# Patient Record
Sex: Female | Born: 1975 | Race: White | Hispanic: No | Marital: Married | Smoking: Never smoker
Health system: Southern US, Community
[De-identification: ages and names within clinical notes are randomized; demographics above are authoritative.]

## PROBLEM LIST (undated history)

## (undated) DIAGNOSIS — C50919 Malignant neoplasm of unspecified site of unspecified female breast: Secondary | ICD-10-CM

## (undated) DIAGNOSIS — Z8041 Family history of malignant neoplasm of ovary: Secondary | ICD-10-CM

## (undated) DIAGNOSIS — Z801 Family history of malignant neoplasm of trachea, bronchus and lung: Secondary | ICD-10-CM

## (undated) DIAGNOSIS — Z800 Family history of malignant neoplasm of digestive organs: Secondary | ICD-10-CM

## (undated) DIAGNOSIS — Z8052 Family history of malignant neoplasm of bladder: Secondary | ICD-10-CM

## (undated) HISTORY — PX: THYROIDECTOMY: SHX17

## (undated) HISTORY — PX: ADENOIDECTOMY: SHX5191

## (undated) HISTORY — DX: Family history of malignant neoplasm of bladder: Z80.52

## (undated) HISTORY — PX: MASTECTOMY: SHX3

## (undated) HISTORY — DX: Family history of malignant neoplasm of trachea, bronchus and lung: Z80.1

## (undated) HISTORY — DX: Family history of malignant neoplasm of digestive organs: Z80.0

## (undated) HISTORY — DX: Family history of malignant neoplasm of ovary: Z80.41

---
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
## 2000-07-26 HISTORY — PX: BREAST EXCISIONAL BIOPSY: SUR124

## 2018-07-14 ENCOUNTER — Other Ambulatory Visit: Payer: Self-pay | Admitting: Family Medicine

## 2018-07-14 DIAGNOSIS — E049 Nontoxic goiter, unspecified: Secondary | ICD-10-CM

## 2018-07-18 ENCOUNTER — Ambulatory Visit
Admission: RE | Admit: 2018-07-18 | Discharge: 2018-07-18 | Disposition: A | Discharge status: Home / Self Care | Payer: 59 | Source: Ambulatory Visit | Attending: Family Medicine | Admitting: Family Medicine

## 2018-07-18 DIAGNOSIS — E049 Nontoxic goiter, unspecified: Secondary | ICD-10-CM

## 2018-07-21 ENCOUNTER — Other Ambulatory Visit: Payer: Self-pay

## 2018-07-24 ENCOUNTER — Other Ambulatory Visit: Payer: Self-pay | Admitting: Family Medicine

## 2018-07-24 DIAGNOSIS — E049 Nontoxic goiter, unspecified: Secondary | ICD-10-CM

## 2018-08-09 ENCOUNTER — Ambulatory Visit
Admission: RE | Admit: 2018-08-09 | Discharge: 2018-08-09 | Disposition: A | Discharge status: Home / Self Care | Payer: 59 | Source: Ambulatory Visit | Attending: Family Medicine | Admitting: Family Medicine

## 2018-08-09 ENCOUNTER — Other Ambulatory Visit (HOSPITAL_COMMUNITY)
Admission: RE | Admit: 2018-08-09 | Discharge: 2018-08-09 | Disposition: A | Discharge status: Home / Self Care | Payer: 59 | Source: Ambulatory Visit | Attending: Radiology | Admitting: Radiology

## 2018-08-09 DIAGNOSIS — E041 Nontoxic single thyroid nodule: Secondary | ICD-10-CM | POA: Insufficient documentation

## 2018-08-09 DIAGNOSIS — E049 Nontoxic goiter, unspecified: Secondary | ICD-10-CM

## 2018-11-22 ENCOUNTER — Other Ambulatory Visit: Payer: Self-pay | Admitting: Family Medicine

## 2018-11-22 DIAGNOSIS — R234 Changes in skin texture: Secondary | ICD-10-CM

## 2018-12-01 ENCOUNTER — Other Ambulatory Visit: Payer: Self-pay | Admitting: Family Medicine

## 2018-12-01 ENCOUNTER — Ambulatory Visit
Admission: RE | Admit: 2018-12-01 | Discharge: 2018-12-01 | Disposition: A | Discharge status: Home / Self Care | Payer: 59 | Source: Ambulatory Visit | Attending: Family Medicine | Admitting: Family Medicine

## 2018-12-01 ENCOUNTER — Other Ambulatory Visit: Payer: Self-pay

## 2018-12-01 DIAGNOSIS — R234 Changes in skin texture: Secondary | ICD-10-CM

## 2018-12-01 DIAGNOSIS — N6320 Unspecified lump in the left breast, unspecified quadrant: Secondary | ICD-10-CM

## 2018-12-06 ENCOUNTER — Telehealth: Payer: Self-pay | Admitting: *Deleted

## 2018-12-06 NOTE — Telephone Encounter (Signed)
MR faxed to [place] RN/Dr. Harland German; [place] [phone]

## 2018-12-08 ENCOUNTER — Other Ambulatory Visit: Payer: Self-pay

## 2018-12-08 ENCOUNTER — Other Ambulatory Visit: Payer: Self-pay | Admitting: Family Medicine

## 2018-12-08 ENCOUNTER — Ambulatory Visit
Admission: RE | Admit: 2018-12-08 | Discharge: 2018-12-08 | Disposition: A | Discharge status: Home / Self Care | Payer: 59 | Source: Ambulatory Visit | Attending: Family Medicine | Admitting: Family Medicine

## 2018-12-08 DIAGNOSIS — N6320 Unspecified lump in the left breast, unspecified quadrant: Secondary | ICD-10-CM

## 2018-12-08 DIAGNOSIS — C50919 Malignant neoplasm of unspecified site of unspecified female breast: Secondary | ICD-10-CM

## 2018-12-08 HISTORY — DX: Malignant neoplasm of unspecified site of unspecified female breast: C50.919

## 2018-12-12 ENCOUNTER — Other Ambulatory Visit: Payer: Self-pay | Admitting: Family Medicine

## 2018-12-12 ENCOUNTER — Telehealth: Payer: Self-pay | Admitting: Oncology

## 2018-12-12 DIAGNOSIS — C50912 Malignant neoplasm of unspecified site of left female breast: Secondary | ICD-10-CM

## 2018-12-12 NOTE — Telephone Encounter (Signed)
Cld and lft the pt a vm to schedule an appt w/Dr. Jana Hakim

## 2018-12-13 ENCOUNTER — Other Ambulatory Visit: Payer: Self-pay

## 2018-12-13 ENCOUNTER — Encounter: Payer: Self-pay | Admitting: Oncology

## 2018-12-13 ENCOUNTER — Inpatient Hospital Stay: Payer: 59 | Attending: Oncology | Admitting: Oncology

## 2018-12-13 DIAGNOSIS — Z170 Estrogen receptor positive status [ER+]: Secondary | ICD-10-CM | POA: Diagnosis not present

## 2018-12-13 DIAGNOSIS — C50812 Malignant neoplasm of overlapping sites of left female breast: Secondary | ICD-10-CM | POA: Insufficient documentation

## 2018-12-13 NOTE — Progress Notes (Signed)
[place]  Telephone:([phone]) [phone] Fax:(336) [phone]     ID: Tonya Whitehead DOB: [birth year]  MR#: [id number]  DGU#:[id number]  Patient Care Team: Hayden Rasmussen, MD as PCP - General (Family Medicine) Breleigh Carpino, Virgie Dad, MD as Consulting Physician (Oncology) Mordecai Rasmussen, MD as Consulting Physician (Surgery) Natividad Brood, MD as Referring Physician (General Surgery) Chauncey Cruel, MD OTHER MD:   CHIEF COMPLAINT: estrogen receptor positive breast cancer  CURRENT TREATMENT: consider neoadjuvant therapy   HISTORY OF CURRENT ILLNESS: Tonya Whitehead notes a history of reconstructive left breast surgery in 2002 while living in [place], [place]. That was for symmetry. The left breast accordingly had some scar tissue and she thought that was what she was noting until mid March, when she felt the left breast was changing more and the nipple seemed to be sinking subtly.  She brought this to Dr Dahlia Bailiff attention and underwent bilateral diagnostic mammography with tomography and bilateral breast ultrasonography at [facility] on 12/01/2018 showing: breast density category C; findings concerning for extensive malignancy throughout the left breast predominately involving the upper- and lower-outer quadrants.  The mass was palpable within the upper outer left breast and there was left nipple retraction as well as indentation along the inferior margin of the left breast.  Targeted ultrasound showed three masses measuring 5.6 cm (2 o'clock position), 4.0 cm (close to the nipple and contiguous to the prior mass), and 3.3 cm (6 o'clock position.).  Also noted was a borderline left axillary lymph node adjacent to the left axillary artery (and so not easy to biopsy).  In the right breast was noted a 1.3 cm cyst.  On 12/08/2018 [facility] proceeded to biopsy of 2 of the left breast areas in question. The pathology from this procedure ([id number]) showed: invasive mammary carcinoma,  grade 2, in two cores (at 2 o'clock and 6 o'clock) with identical histomorphology; e-cadherin is negative, consistent with a lobular phenotype. Prognostic indicators significant for: estrogen receptor, 70% positive and progesterone receptor, 70% positive, both with strong staining intensity. Proliferation marker Ki67 at 3%. HER2 negative by immunohistochemistry (1+).  Note also the patient had a thyroid ultrasound 07/18/2018 showing bilateral solitary nodules, which were biopsied 08/09/2018.  Both showed follicular nodules, Bethesda category 2 (benign).  The patient's subsequent history is as detailed below.   INTERVAL HISTORY: [name] was evaluated in the breast cancer clinic on 12/13/2018. Her husband Doug participated via [place].  She developed a mild ecchymosis and some discomfort from the recent biopsy.  She is scheduled for bilateral breast MRI on 12/20/2018.   REVIEW OF SYSTEMS: Paulena reports a history of joint pain. She has since cut gluten out of her diet, which has improved her joint pain.  There are no areas of persistent localized pain.  The patient denies unusual headaches, visual changes, nausea, vomiting, stiff neck, dizziness, or gait imbalance. There has been no cough, phlegm production, or pleurisy, no chest pain or pressure, and no change in bowel or bladder habits. The patient denies fever, rash, bleeding, unexplained fatigue or unexplained weight loss. A detailed review of systems was otherwise entirely negative.   PAST MEDICAL HISTORY: Past Medical History:  Diagnosis Date   Breast cancer ([facility]) 12/08/2018   invasive lobular    PAST SURGICAL HISTORY: Past Surgical History:  Procedure Laterality Date   ADENOIDECTOMY     as a child   BREAST EXCISIONAL BIOPSY Left 2002   reconstructive surgery on nipple to match right breast  Adenoidectomy as a  child.  FAMILY HISTORY: Family History  Problem Relation Age of Onset   Uterine cancer Mother 36   Lung cancer  Mother 50       smoker   Colon cancer Maternal Aunt        108s   Bladder Cancer Paternal Grandfather        death was unrelated   Cancer Paternal Aunt    As of May 2020 patient's father is alive at age 70. Patient's mother is also living at age 53. She has ovarian cancer, diagnosed in 2019, and lung cancer (stage IV), diagnosed in 2016. She tested negative for BRCA.  A maternal aunt had colon cancer in her 49s.  3 other siblings of the patient's mother have not had any cancer.  On the paternal side the paternal grandmother had bladder cancer.  A paternal great aunt died from cancer of the pancreas.     GYNECOLOGIC HISTORY:  Patient's last menstrual period was 11/14/2018. Menarche: 43 years old Age at first live birth: 43 years old GX P 3 LMP every month, 5 total days with 2 heavy Contraceptive no, husband with vasectomy. HRT n/a  Hysterectomy? no BSO? 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 Husband Tonya Whitehead is a physical therapist. He is biologically [place] and was adopted by a [place] family.  The patient lives at home with her husband and their 3 children: [name] is 96, [name] is 20, and  [name], 59.  The patient attends a [employer].    ADVANCED DIRECTIVES: Husband Tonya Whitehead is her HCPOA   HEALTH MAINTENANCE: Social History   Tobacco Use   Smoking status: Not on file  Substance Use Topics   Alcohol use: Not on file   Drug use: Never     Colonoscopy: never done  PAP: not on file  Bone density: never done   Allergies  Allergen Reactions   Sulfa Antibiotics     Unknown reaction " I was told I had a reaction when I was a baby "    No current outpatient medications on file.   No current facility-administered medications for this visit.     OBJECTIVE: Young white woman who appears well  Vitals:   12/13/18 1613  BP: 111/86  Pulse: 77  Resp: 18  Temp: 98.5 F (36.9 C)  SpO2: 100%     Body mass  index is 23.91 kg/m.   Wt Readings from Last 3 Encounters:  12/13/18 143 lb 11.2 oz (65.2 kg)      ECOG FS:0 - Asymptomatic  Ocular: Sclerae unicteric, pupils round and equal Lymphatic: No cervical or supraclavicular adenopathy Lungs no rales or rhonchi Heart regular rate and rhythm Abd soft, nontender, positive bowel sounds MSK no focal spinal tenderness, no joint edema Neuro: non-focal, well-oriented, appropriate affect Breasts: The right breast is unremarkable.  The left breast is status post recent biopsy. There is a moderate ecchymosis in the inferior aspect of the breast.  The nipple is slightly retracted.  The areola is smaller than the right areola, but recall this breast is status post prior surgery.  The left breast masses are easily palpable but are very hard to delineate.  Both axillae are benign.  Left breast status post recent biopsy photographed 12/13/2018      LAB RESULTS:  CMP  No results found for: NA, K, CL, CO2, GLUCOSE, BUN, CREATININE, CALCIUM, PROT, ALBUMIN, AST, ALT, ALKPHOS, BILITOT, GFRNONAA, GFRAA  No results  found for: TOTALPROTELP, ALBUMINELP, A1GS, A2GS, BETS, BETA2SER, GAMS, MSPIKE, SPEI  No results found for: KPAFRELGTCHN, LAMBDASER, KAPLAMBRATIO  No results found for: WBC, NEUTROABS, HGB, HCT, MCV, PLT  _0 @  No results found for: LABCA2  No components found for: [id number]  No results for input(s): INR in the last 168 hours.  No results found for: LABCA2  No results found for: BZJ696  No results found for: VEL381  No results found for: OFB510  No results found for: CA2729  No components found for: HGQUANT  No results found for: CEA1 / No results found for: CEA1   No results found for: AFPTUMOR  No results found for: CHROMOGRNA  No results found for: PSA1  No visits with results within 3 Day(s) from this visit.  Latest known visit with results is:  No results found for any previous visit.    (this displays  the last labs from the last 3 days)  No results found for: TOTALPROTELP, ALBUMINELP, A1GS, A2GS, BETS, BETA2SER, GAMS, MSPIKE, SPEI (this displays SPEP labs)  No results found for: KPAFRELGTCHN, LAMBDASER, KAPLAMBRATIO (kappa/lambda light chains)  No results found for: HGBA, HGBA2QUANT, HGBFQUANT, HGBSQUAN (Hemoglobinopathy evaluation)   No results found for: LDH  No results found for: IRON, TIBC, IRONPCTSAT (Iron and TIBC)  No results found for: FERRITIN  Urinalysis No results found for: COLORURINE, APPEARANCEUR, LABSPEC, PHURINE, GLUCOSEU, HGBUR, BILIRUBINUR, KETONESUR, PROTEINUR, UROBILINOGEN, NITRITE, LEUKOCYTESUR   STUDIES: US Breast Ltd Uni Left Inc Axilla  Result Date: 12/01/2018 CLINICAL DATA:  Patient presents for palpable abnormality and indentation within the left breast. EXAM: DIGITAL DIAGNOSTIC BILATERAL MAMMOGRAM WITH CAD AND TOMO ULTRASOUND LEFT BREAST COMPARISON:  None ACR Breast Density Category c: The breast tissue is heterogeneously dense, which may obscure small masses. FINDINGS: The left breast is shrunken in appearance. There is a large area of distortion with associated mass predominately involving the upper outer and lower outer left breast. This appears to extend centrally within the left breast to the level of the nipple. Within the superior central right breast there is an oval circumscribed mass. Mammographic images were processed with CAD. On physical exam, I palpate a firm mass within the upper-outer left breast. There is left nipple retraction. There is indentation along the inferior margin of the left breast. Targeted ultrasound is performed, showing a 1.3 x 0.8 x 1.3 cm cyst right breast 12 o'clock position 2 cm from the nipple. Within the left breast 2 o'clock position 1-2 cm from the nipple there is a 4.6 x 2.9 x 5.6 cm irregular mixed echogenicity mass. This mass appears to extend to the level of the nipple. Slightly closer to the nipple, extending and  contiguous with the previously described mass is an additional 4.0 x 3.5 x 4.0 cm irregular mixed echogenicity mass left breast 2 o'clock position 1 cm from the nipple. Within the left breast 6 o'clock position 3 cm from nipple there is a 3.0 x 1.7 x 3.3 cm irregular hypoechoic mass. Borderline left axillary lymph node measuring 3 mm. This lymph node is deep within the left axilla and appears adjacent to a left axillary artery. IMPRESSION: 1. Findings concerning for extensive malignancy throughout the left breast predominately involving the upper outer and lower outer quadrants. 2. Borderline left axillary lymph node measuring 3 mm. RECOMMENDATION: 1. Ultrasound-guided core needle biopsy large left breast mass 2 o'clock position 1-2 cm from nipple. 2. Ultrasound-guided core needle biopsy left breast mass 6 o'clock position. 3. The left axillary lymph node  is borderline in size and deep within the axilla, adjacent to an artery. This is likely not accessible to percutaneous biopsy. Recommend attention to the axilla on MRI. 4. After ultrasound-guided core needle biopsy, recommend bilateral breast MRI for further evaluation of extent of disease given dense breast tissue and shrunken left breast. I have discussed the findings and recommendations with the patient. Results were also provided in writing at the conclusion of the visit. If applicable, a reminder letter will be sent to the patient regarding the next appointment. BI-RADS CATEGORY  5: Highly suggestive of malignancy. Electronically Signed   By: Lovey Newcomer M.D.   On: 12/01/2018 17:03   US Breast Ltd Uni Right Inc Axilla  Result Date: 12/01/2018 CLINICAL DATA:  Patient presents for palpable abnormality and indentation within the left breast. EXAM: DIGITAL DIAGNOSTIC BILATERAL MAMMOGRAM WITH CAD AND TOMO ULTRASOUND LEFT BREAST COMPARISON:  None ACR Breast Density Category c: The breast tissue is heterogeneously dense, which may obscure small masses. FINDINGS:  The left breast is shrunken in appearance. There is a large area of distortion with associated mass predominately involving the upper outer and lower outer left breast. This appears to extend centrally within the left breast to the level of the nipple. Within the superior central right breast there is an oval circumscribed mass. Mammographic images were processed with CAD. On physical exam, I palpate a firm mass within the upper-outer left breast. There is left nipple retraction. There is indentation along the inferior margin of the left breast. Targeted ultrasound is performed, showing a 1.3 x 0.8 x 1.3 cm cyst right breast 12 o'clock position 2 cm from the nipple. Within the left breast 2 o'clock position 1-2 cm from the nipple there is a 4.6 x 2.9 x 5.6 cm irregular mixed echogenicity mass. This mass appears to extend to the level of the nipple. Slightly closer to the nipple, extending and contiguous with the previously described mass is an additional 4.0 x 3.5 x 4.0 cm irregular mixed echogenicity mass left breast 2 o'clock position 1 cm from the nipple. Within the left breast 6 o'clock position 3 cm from nipple there is a 3.0 x 1.7 x 3.3 cm irregular hypoechoic mass. Borderline left axillary lymph node measuring 3 mm. This lymph node is deep within the left axilla and appears adjacent to a left axillary artery. IMPRESSION: 1. Findings concerning for extensive malignancy throughout the left breast predominately involving the upper outer and lower outer quadrants. 2. Borderline left axillary lymph node measuring 3 mm. RECOMMENDATION: 1. Ultrasound-guided core needle biopsy large left breast mass 2 o'clock position 1-2 cm from nipple. 2. Ultrasound-guided core needle biopsy left breast mass 6 o'clock position. 3. The left axillary lymph node is borderline in size and deep within the axilla, adjacent to an artery. This is likely not accessible to percutaneous biopsy. Recommend attention to the axilla on MRI. 4.  After ultrasound-guided core needle biopsy, recommend bilateral breast MRI for further evaluation of extent of disease given dense breast tissue and shrunken left breast. I have discussed the findings and recommendations with the patient. Results were also provided in writing at the conclusion of the visit. If applicable, a reminder letter will be sent to the patient regarding the next appointment. BI-RADS CATEGORY  5: Highly suggestive of malignancy. Electronically Signed   By: Lovey Newcomer M.D.   On: 12/01/2018 17:03   Mm Diag Breast Tomo Bilateral  Result Date: 12/01/2018 CLINICAL DATA:  Patient presents for palpable abnormality and  indentation within the left breast. EXAM: DIGITAL DIAGNOSTIC BILATERAL MAMMOGRAM WITH CAD AND TOMO ULTRASOUND LEFT BREAST COMPARISON:  None ACR Breast Density Category c: The breast tissue is heterogeneously dense, which may obscure small masses. FINDINGS: The left breast is shrunken in appearance. There is a large area of distortion with associated mass predominately involving the upper outer and lower outer left breast. This appears to extend centrally within the left breast to the level of the nipple. Within the superior central right breast there is an oval circumscribed mass. Mammographic images were processed with CAD. On physical exam, I palpate a firm mass within the upper-outer left breast. There is left nipple retraction. There is indentation along the inferior margin of the left breast. Targeted ultrasound is performed, showing a 1.3 x 0.8 x 1.3 cm cyst right breast 12 o'clock position 2 cm from the nipple. Within the left breast 2 o'clock position 1-2 cm from the nipple there is a 4.6 x 2.9 x 5.6 cm irregular mixed echogenicity mass. This mass appears to extend to the level of the nipple. Slightly closer to the nipple, extending and contiguous with the previously described mass is an additional 4.0 x 3.5 x 4.0 cm irregular mixed echogenicity mass left breast 2 o'clock  position 1 cm from the nipple. Within the left breast 6 o'clock position 3 cm from nipple there is a 3.0 x 1.7 x 3.3 cm irregular hypoechoic mass. Borderline left axillary lymph node measuring 3 mm. This lymph node is deep within the left axilla and appears adjacent to a left axillary artery. IMPRESSION: 1. Findings concerning for extensive malignancy throughout the left breast predominately involving the upper outer and lower outer quadrants. 2. Borderline left axillary lymph node measuring 3 mm. RECOMMENDATION: 1. Ultrasound-guided core needle biopsy large left breast mass 2 o'clock position 1-2 cm from nipple. 2. Ultrasound-guided core needle biopsy left breast mass 6 o'clock position. 3. The left axillary lymph node is borderline in size and deep within the axilla, adjacent to an artery. This is likely not accessible to percutaneous biopsy. Recommend attention to the axilla on MRI. 4. After ultrasound-guided core needle biopsy, recommend bilateral breast MRI for further evaluation of extent of disease given dense breast tissue and shrunken left breast. I have discussed the findings and recommendations with the patient. Results were also provided in writing at the conclusion of the visit. If applicable, a reminder letter will be sent to the patient regarding the next appointment. BI-RADS CATEGORY  5: Highly suggestive of malignancy. Electronically Signed   By: Lovey Newcomer M.D.   On: 12/01/2018 17:03   Mm Clip Placement Left  Result Date: 12/08/2018 CLINICAL DATA:  Patient status post ultrasound-guided core needle biopsy 2 left breast. EXAM: DIAGNOSTIC LEFT MAMMOGRAM POST ULTRASOUND BIOPSY COMPARISON:  Previous exam(s). FINDINGS: Mammographic images were obtained following ultrasound guided biopsy of left breast masses. Site 1: Upper-outer left breast: Ribbon shaped marking clip: In appropriate position. Site 2: Inferior left breast: Coil shaped clip: In appropriate position. IMPRESSION: Appropriate position  biopsy marking clip status post ultrasound-guided biopsy 2 left breast masses. Final Assessment: Post Procedure Mammograms for Marker Placement Electronically Signed   By: Lovey Newcomer M.D.   On: 12/08/2018 10:13   [place] Lt Breast Bx W Loc Dev 1st Lesion Img Bx Spec US Guide  Addendum Date: 12/13/2018   ADDENDUM REPORT: 12/12/2018 17:37 ADDENDUM: Pathology revealed GRADE II INVASIVE MAMMARY CARCINOMA, INTERMEDIATE GRADE MAMMARY CARCINOMA IN SITU of the LEFT breast, 2 o'clock. This was  found to be concordant by Dr. Lovey Newcomer. Pathology revealed GRADE II INVASIVE MAMMARY CARCINOMA, INTERMEDIATE GRADE MAMMARY CARCINOMA IN SITU of the LEFT breast, 6 o'clock. This was found to be concordant by Dr. Lovey Newcomer. Pathology report and recommendations were faxed to Dr. Horald Pollen on Dec 11, 2018. Per provider request, pathology results were discussed with the patient by Dr. Horald Pollen of [facility] at her office. The patient was then contacted by Stacie Acres RN. She reported doing well after the biopsies with tenderness at the sites. Post biopsy instructions and care were reviewed and questions were answered. The patient was encouraged to call [facility] for any additional concerns. Bilateral breast MRI is recommended to further evaluate extent of disease given dense breast tissue and shrunken left breast. The LEFT axillary lymph node is borderline in size and deep within the axilla, adjacent to an artery. This is likely not accessible to percutaneous biopsy. Recommend attention to the axilla on MRI. Bilateral breast MRI is scheduled for Dec 23, 2018. Per patient requests, Oncologist consultation with Dr. Lurline Del of [place] is Dec 13, 2018 and surgical consultation with Dr. Natividad Brood of [place] is December 27, 2018. Pathology results reported by Stacie Acres, RN on 12/12/2018. Electronically Signed   By: Franki Cabot M.D.    On: 12/12/2018 17:37   Result Date: 12/13/2018 CLINICAL DATA:  Patient with indeterminate left breast masses. EXAM: ULTRASOUND GUIDED LEFT BREAST CORE NEEDLE BIOPSY COMPARISON:  Previous exam(s). FINDINGS: I met with the patient and we discussed the procedure of ultrasound-guided biopsy, including benefits and alternatives. We discussed the high likelihood of a successful procedure. We discussed the risks of the procedure, including infection, bleeding, tissue injury, clip migration, and inadequate sampling. Informed written consent was given. The usual time-out protocol was performed immediately prior to the procedure. Site 1: 2 o'clock left breast: Ribbon shaped marking clip. Lesion quadrant: Upper outer quadrant Using sterile technique and 1% Lidocaine as local anesthetic, under direct ultrasound visualization, a 14 gauge spring-loaded device was used to perform biopsy of left breast mass 2 o'clock position using a lateral approach. At the conclusion of the procedure a ribbon shaped tissue marker clip was deployed into the biopsy cavity. Follow up 2 view mammogram was performed and dictated separately. Site 2: 6 o'clock position left breast: Coil shaped marking clip. Lesion quadrant: Lower inner quadrant Using sterile technique and 1% Lidocaine as local anesthetic, under direct ultrasound visualization, a 14 gauge spring-loaded device was used to perform biopsy of left breast mass 6 o'clock position using a lateral approach. At the conclusion of the procedure a coil shaped tissue marker clip was deployed into the biopsy cavity. Follow up 2 view mammogram was performed and dictated separately. IMPRESSION: Ultrasound guided biopsy of left breast masses as above. No apparent complications. Electronically Signed: By: Lovey Newcomer M.D. On: 12/08/2018 10:15   [place] Lt Breast Bx W Loc Dev Ea Add Lesion Img Bx Spec US Guide  Addendum Date: 12/13/2018   ADDENDUM REPORT: 12/12/2018 17:37 ADDENDUM: Pathology revealed  GRADE II INVASIVE MAMMARY CARCINOMA, INTERMEDIATE GRADE MAMMARY CARCINOMA IN SITU of the LEFT breast, 2 o'clock. This was found to be concordant by Dr. Lovey Newcomer. Pathology revealed GRADE II INVASIVE MAMMARY CARCINOMA, INTERMEDIATE GRADE MAMMARY CARCINOMA IN SITU of the LEFT breast, 6 o'clock. This was found to be concordant by Dr. Lovey Newcomer. Pathology report and recommendations were faxed to Dr.  Horald Pollen on Dec 11, 2018. Per provider request, pathology results were discussed with the patient by Dr. Horald Pollen of [facility] at her office. The patient was then contacted by Stacie Acres RN. She reported doing well after the biopsies with tenderness at the sites. Post biopsy instructions and care were reviewed and questions were answered. The patient was encouraged to call [facility] for any additional concerns. Bilateral breast MRI is recommended to further evaluate extent of disease given dense breast tissue and shrunken left breast. The LEFT axillary lymph node is borderline in size and deep within the axilla, adjacent to an artery. This is likely not accessible to percutaneous biopsy. Recommend attention to the axilla on MRI. Bilateral breast MRI is scheduled for Dec 23, 2018. Per patient requests, Oncologist consultation with Dr. Lurline Del of [facility] [place] is Dec 13, 2018 and surgical consultation with Dr. Natividad Brood of [place] is December 27, 2018. Pathology results reported by Stacie Acres, RN on 12/12/2018. Electronically Signed   By: Franki Cabot M.D.   On: 12/12/2018 17:37   Result Date: 12/13/2018 CLINICAL DATA:  Patient with indeterminate left breast masses. EXAM: ULTRASOUND GUIDED LEFT BREAST CORE NEEDLE BIOPSY COMPARISON:  Previous exam(s). FINDINGS: I met with the patient and we discussed the procedure of ultrasound-guided biopsy, including benefits and alternatives. We discussed the high likelihood of a  successful procedure. We discussed the risks of the procedure, including infection, bleeding, tissue injury, clip migration, and inadequate sampling. Informed written consent was given. The usual time-out protocol was performed immediately prior to the procedure. Site 1: 2 o'clock left breast: Ribbon shaped marking clip. Lesion quadrant: Upper outer quadrant Using sterile technique and 1% Lidocaine as local anesthetic, under direct ultrasound visualization, a 14 gauge spring-loaded device was used to perform biopsy of left breast mass 2 o'clock position using a lateral approach. At the conclusion of the procedure a ribbon shaped tissue marker clip was deployed into the biopsy cavity. Follow up 2 view mammogram was performed and dictated separately. Site 2: 6 o'clock position left breast: Coil shaped marking clip. Lesion quadrant: Lower inner quadrant Using sterile technique and 1% Lidocaine as local anesthetic, under direct ultrasound visualization, a 14 gauge spring-loaded device was used to perform biopsy of left breast mass 6 o'clock position using a lateral approach. At the conclusion of the procedure a coil shaped tissue marker clip was deployed into the biopsy cavity. Follow up 2 view mammogram was performed and dictated separately. IMPRESSION: Ultrasound guided biopsy of left breast masses as above. No apparent complications. Electronically Signed: By: Lovey Newcomer M.D. On: 12/08/2018 10:15    ELIGIBLE FOR AVAILABLE RESEARCH PROTOCOL: no  ASSESSMENT: 43 y.o. [place] woman status post left breast biopsy x2 on 12/08/2018, for multicentric invasive lobular breast cancer, grade 2, estrogen and progesterone receptor positive, HER-2 nonamplified, with an MIB-1 of 3%  (1) consider neoadjuvant chemotherapy  (2) definitive surgery to follow  (3) adjuvant radiation  (4) antiestrogens  (5) genetics testing    PLAN: I spent approximately 60 minutes face to face with Shanterria with more than 50% of  that time spent in counseling and coordination of care. Specifically we reviewed the biology of the patient's diagnosis and the specifics of her situation.  We first reviewed the fact that cancer is not one disease but more than 100 different diseases and that it is important to keep them separate-- otherwise when friends and  relatives discuss their own cancer experiences with Shresta confusion can result. Similarly we explained that if breast cancer spreads to the bone or liver, the patient would not have bone cancer or liver cancer, but breast cancer in the bone and breast cancer in the liver: one cancer in three places-- not 3 different cancers which otherwise would have to be treated in 3 different ways.  She understands she has a lobular breast cancer, and that these cancers can be very difficult to detect radiographically.  This is important as she considers whether she would like bilateral rather than single mastectomy.  We discussed the difference between local and systemic therapy. In terms of loco-regional treatment, lumpectomy plus radiation is equivalent to mastectomy as far as survival is concerned.  However, given that she has multicentric disease on the left, I do not think it will be possible to save that breast.  We also discussed the fact that whether she has systemic treatment first and then surgery or the reverse does not affect survival.  Particularly if the surgeon after reviewing the breast MRI to be done next week feels shrinking the tumor some would make the surgery margins more definitive, we can proceed with neoadjuvant treatment.  We then discussed the rationale for systemic therapy. There is some risk that this cancer may have already spread to other parts of her body. We will obtain a CT scan of the chest and a bone scan which I expect will be negative.  That does not mean that there is not microscopic spread, and in fact given her stage her risk of having microscopic spread  (which would manifest itself as recurrence in the absence of any systemic treatment) is on the high side.  Next we went over the options for systemic therapy which are anti-estrogens, anti-HER-2 immunotherapy, and chemotherapy. Mahogony does not meet criteria for anti-HER-2 immunotherapy. She is a good candidate for anti-estrogens.  The question of chemotherapy is more complicated. Chemotherapy is most effective in rapidly growing, aggressive tumors. It is much less effective in slow growing cancers.  We do have Oncotype and MammaPrint which we could consider sending.  My hesitation there is the fact that we are dealing with a lobular breast cancer and 90% of the tumor is used in the databases for those particular studies were ductal.  The database becomes even smaller when we realized we are dealing with someone who is under 11.  The number of women under 45 with lobular breast cancers in either of those databases is simply not high enough that we can confidently omit chemotherapy even if her tumor is read as "low risk".  In that regard the fact that her lobular breast cancer is not grade 1 is of concern  Accordingly I believe she will need chemotherapy as part of her systemic treatment.  We do know that lobular breast cancers do not respond as briskly as ductal as when treated neoadjuvantly but that does not mean they do not respond.  We would certainly obtain some tumor shrinkage with neoadjuvant chemotherapy if we go that route.  At this point then the plan is for her to have her staging studies and her MRI.  She already has an appointment with Dr. Genia Hotter.  We will also get her an appointment with genetics and of course the results of that test may further inform her decision whether to have bilateral mastectomies, which is the choice she is currently leaning towards.  Kambry has a good understanding of the  overall plan. She agrees with it. She knows the goal of treatment in her case is cure. She will  call with any problems that may develop before her next visit here.   Chauncey Cruel, MD   12/13/2018 5:26 PM Medical Oncology and Hematology [facility] [address] [place], [place] [address] Tel. [phone]    Fax. [phone]   This document serves as a record of services personally performed by Lurline Del, MD. It was created on his behalf by Wilburn Mylar, a trained medical scribe. The creation of this record is based on the scribe's personal observations and the provider's statements to them.   I, Lurline Del MD, have reviewed the above documentation for accuracy and completeness, and I agree with the above.

## 2018-12-14 ENCOUNTER — Telehealth: Payer: Self-pay | Admitting: Oncology

## 2018-12-14 ENCOUNTER — Telehealth: Payer: Self-pay | Admitting: *Deleted

## 2018-12-14 NOTE — Telephone Encounter (Signed)
Scheduled appt per 5/20 sch message -  Unable to reach patient . Left message with appt date and time for Genetics

## 2018-12-14 NOTE — Telephone Encounter (Signed)
Called pt to provide navigation resources and contact information. Left vm with contact information for questions or needs. 

## 2018-12-15 ENCOUNTER — Telehealth: Payer: Self-pay | Admitting: Licensed Clinical Social Worker

## 2018-12-15 NOTE — Telephone Encounter (Signed)
Called patient regarding upcoming Webex appointment, left patient a voicemail and sent an e-mail.

## 2018-12-19 ENCOUNTER — Telehealth: Payer: Self-pay | Admitting: *Deleted

## 2018-12-19 ENCOUNTER — Inpatient Hospital Stay: Payer: 59 | Admitting: Licensed Clinical Social Worker

## 2018-12-19 NOTE — Telephone Encounter (Signed)
Spoke to pt regarding tx plan. Provided navigation resources. Pt will have 2 surgical consults. Informed pt once she has made her decision on surgeon and pathway to inform us. Provided contact information for questions or concerns.

## 2018-12-20 ENCOUNTER — Telehealth: Payer: Self-pay | Admitting: *Deleted

## 2018-12-20 ENCOUNTER — Ambulatory Visit
Admission: RE | Admit: 2018-12-20 | Discharge: 2018-12-20 | Disposition: A | Discharge status: Home / Self Care | Payer: 59 | Source: Ambulatory Visit | Attending: Family Medicine | Admitting: Family Medicine

## 2018-12-20 ENCOUNTER — Other Ambulatory Visit: Payer: Self-pay

## 2018-12-20 DIAGNOSIS — C50912 Malignant neoplasm of unspecified site of left female breast: Secondary | ICD-10-CM

## 2018-12-20 MED ORDER — GADOBUTROL 1 MMOL/ML IV SOLN
7.0000 mL | Freq: Once | INTRAVENOUS | Status: AC | PRN
  Administered 2018-12-20: 7 mL via INTRAVENOUS

## 2018-12-20 NOTE — Telephone Encounter (Signed)
Called pt and discussed MRI results of axilla. Pt informed she is scheduled for surgical consult later this week at [facility] and next week at [facility].

## 2018-12-21 ENCOUNTER — Telehealth: Payer: Self-pay | Admitting: Oncology

## 2018-12-21 NOTE — Telephone Encounter (Signed)
Scheduled appt per sch msg. Called and left msg.  °

## 2018-12-22 ENCOUNTER — Other Ambulatory Visit: Payer: Self-pay | Admitting: Oncology

## 2018-12-22 ENCOUNTER — Encounter: Payer: Self-pay | Admitting: Licensed Clinical Social Worker

## 2018-12-23 ENCOUNTER — Other Ambulatory Visit: Payer: 59

## 2018-12-25 ENCOUNTER — Ambulatory Visit: Payer: 59 | Admitting: Oncology

## 2018-12-25 DIAGNOSIS — C50412 Malignant neoplasm of upper-outer quadrant of left female breast: Secondary | ICD-10-CM | POA: Insufficient documentation

## 2018-12-25 DIAGNOSIS — Z170 Estrogen receptor positive status [ER+]: Secondary | ICD-10-CM | POA: Insufficient documentation

## 2018-12-26 ENCOUNTER — Telehealth: Payer: Self-pay | Admitting: Oncology

## 2018-12-26 NOTE — Telephone Encounter (Signed)
Faxed records to [facility] release #[id number]

## 2018-12-27 ENCOUNTER — Telehealth: Payer: Self-pay | Admitting: Oncology

## 2018-12-27 NOTE — Telephone Encounter (Signed)
Called patient regarding upcoming Webex appointment, patient is notified and would like to reschedule genetic counseling meeting as well. E-mails have been sent.

## 2018-12-29 ENCOUNTER — Ambulatory Visit (HOSPITAL_COMMUNITY)
Admission: RE | Admit: 2018-12-29 | Discharge: 2018-12-29 | Disposition: A | Discharge status: Home / Self Care | Payer: 59 | Source: Ambulatory Visit | Attending: Oncology | Admitting: Oncology

## 2018-12-29 ENCOUNTER — Other Ambulatory Visit: Payer: Self-pay

## 2018-12-29 ENCOUNTER — Encounter (HOSPITAL_COMMUNITY): Payer: Self-pay

## 2018-12-29 ENCOUNTER — Encounter (HOSPITAL_COMMUNITY)
Admission: RE | Admit: 2018-12-29 | Discharge: 2018-12-29 | Disposition: A | Discharge status: Home / Self Care | Payer: 59 | Source: Ambulatory Visit | Attending: Oncology | Admitting: Oncology

## 2018-12-29 DIAGNOSIS — C50812 Malignant neoplasm of overlapping sites of left female breast: Secondary | ICD-10-CM | POA: Diagnosis not present

## 2018-12-29 DIAGNOSIS — Z170 Estrogen receptor positive status [ER+]: Secondary | ICD-10-CM | POA: Diagnosis present

## 2018-12-29 MED ORDER — TECHNETIUM TC 99M MEDRONATE IV KIT
20.0000 | PACK | Freq: Once | INTRAVENOUS | Status: AC | PRN
  Administered 2018-12-29: 20 via INTRAVENOUS

## 2018-12-29 MED ORDER — SODIUM CHLORIDE (PF) 0.9 % IJ SOLN
INTRAMUSCULAR | Status: AC
  Filled 2018-12-29: qty 50

## 2018-12-29 MED ORDER — IOHEXOL 300 MG/ML  SOLN
75.0000 mL | Freq: Once | INTRAMUSCULAR | Status: AC | PRN
  Administered 2018-12-29: 75 mL via INTRAVENOUS

## 2019-01-01 ENCOUNTER — Inpatient Hospital Stay (HOSPITAL_BASED_OUTPATIENT_CLINIC_OR_DEPARTMENT_OTHER): Payer: 59 | Admitting: Licensed Clinical Social Worker

## 2019-01-01 ENCOUNTER — Inpatient Hospital Stay: Payer: 59 | Attending: Oncology | Admitting: Oncology

## 2019-01-01 ENCOUNTER — Inpatient Hospital Stay: Payer: 59 | Admitting: Nutrition

## 2019-01-01 ENCOUNTER — Encounter: Payer: Self-pay | Admitting: Licensed Clinical Social Worker

## 2019-01-01 DIAGNOSIS — Z8041 Family history of malignant neoplasm of ovary: Secondary | ICD-10-CM

## 2019-01-01 DIAGNOSIS — Z801 Family history of malignant neoplasm of trachea, bronchus and lung: Secondary | ICD-10-CM

## 2019-01-01 DIAGNOSIS — Z800 Family history of malignant neoplasm of digestive organs: Secondary | ICD-10-CM | POA: Diagnosis not present

## 2019-01-01 DIAGNOSIS — Z8052 Family history of malignant neoplasm of bladder: Secondary | ICD-10-CM | POA: Insufficient documentation

## 2019-01-01 DIAGNOSIS — Z170 Estrogen receptor positive status [ER+]: Secondary | ICD-10-CM

## 2019-01-01 DIAGNOSIS — C50812 Malignant neoplasm of overlapping sites of left female breast: Secondary | ICD-10-CM

## 2019-01-01 DIAGNOSIS — Z1379 Encounter for other screening for genetic and chromosomal anomalies: Secondary | ICD-10-CM

## 2019-01-01 NOTE — Progress Notes (Signed)
REFERRING PROVIDER: Chauncey Cruel, MD [address] [place], [place] [address]  PRIMARY PROVIDER:  Hayden Rasmussen, MD  PRIMARY REASON FOR VISIT:  1. Family history of ovarian cancer   2. Family history of colon cancer   3. Family history of lung cancer   4. Family history of bladder cancer    I connected with Tonya Whitehead on 01/01/2019 at 1:00 PM EDT by Webex and verified that I am speaking with the correct person using two identifiers.    Patient location: home Provider location: clinic   HISTORY OF PRESENT ILLNESS:   Ms. Grindle, a 43 y.o. female, was seen for a [place] cancer genetics consultation at the request of Dr. Jana Hakim due to a personal and family history of cancer.  Ms. Vasseur presents to clinic today to discuss the possibility of a hereditary predisposition to cancer, genetic testing, and to further clarify her future cancer risks, as well as potential cancer risks for family members.   In 2020, at the age of 62, Ms. Busick was diagnosed with invasive mammary carcinoma consistent with lobular phenotype of the left breast, ER+ PR+ Her2-. The treatment plan includes mastectomy on 6/23. Patient notes the results of genetic testing may change her surgical decision. Treatment also includes chemotherapy, radiation, and antiestrogens.  RISK FACTORS:  Menarche was at age 65  First live birth at age 54.  OCP use for approximately 0 years.  Ovaries intact: yes.  Hysterectomy: no.  Menopausal status: premenopausal.  HRT use: 0 years. Colonoscopy: no; not examined. Mammogram within the last year: yes.  Any excessive radiation exposure in the past: no  Past Medical History:  Diagnosis Date  . Breast cancer ([place]) 12/08/2018   invasive lobular  . Family history of bladder cancer   . Family history of colon cancer   . Family history of lung cancer   . Family history of ovarian cancer     Past Surgical History:  Procedure Laterality Date  .  ADENOIDECTOMY     as a child  . BREAST EXCISIONAL BIOPSY Left 2002   reconstructive surgery on nipple to match right breast    Social History   Socioeconomic History  . Marital status: Married    Spouse name: [name]  . Number of children: 3  . Years of education: Not on file  . Highest education level: Not on file  Occupational History  . Not on file  Social Needs  . Financial resource strain: Not on file  . Food insecurity:    Worry: Not on file    Inability: Not on file  . Transportation needs:    Medical: Not on file    Non-medical: Not on file  Tobacco Use  . Smoking status: Not on file  Substance and Sexual Activity  . Alcohol use: Not on file  . Drug use: Never  . Sexual activity: Yes    Birth control/protection: Other-see comments    Comment: husband had vasectomy  Lifestyle  . Physical activity:    Days per week: Not on file    Minutes per session: Not on file  . Stress: Not on file  Relationships  . Social connections:    Talks on phone: Not on file    Gets together: Not on file    Attends religious service: Not on file    Active member of club or organization: Not on file    Attends meetings of clubs or organizations: Not on file  Relationship status: Not on file  Other Topics Concern  . Not on file  Social History Narrative  . Not on file     FAMILY HISTORY:  We obtained a detailed, 4-generation family history.  Significant diagnoses are listed below: Family History  Problem Relation Age of Onset  . Lung cancer Mother 31       smoker  . Ovarian cancer Mother 62  . Colon cancer Maternal Aunt        55s  . Bladder Cancer Paternal Grandfather        death was unrelated  . Cancer Paternal Aunt        through [place], blood cancer   Ms. Thornsberry has 2 daughters and 1 son, no cancers. She has two maternal half brothers, one maternal half sister, no cancers.  Ms. Weimer mother is living at 41, was diagnosed with lung cancer at 54, history of  smoking. She was also diagnosed with ovarian cancer at 40. She had genetic testing through [facility], which was negative and the patient emailed me a copy of this report. The results included 2 VUS's, one in the APC gene the other in the POLE gene. The patient has 2 maternal uncles, 2 maternal aunts. One of her aunts had colon cancer in her 70s and died in her 53s. No cancers in cousins. Her maternal grandmother died at 66, maternal grandfather died at 34.  Ms. Zehren's father is living at 83. The patient has 1 paternal aunt, 1 paternal uncle, no cancers. No cancers in paternal cousins. Her paternal grandfather died at 19, he had bladder cancer diagnosed at 24 and history of smoking. Paternal grandmother is living at 24. This grandmother has a twin who had a blood cancer in her 69s, died in her 57s.  Ms. Hidrogo is aware of previous family history of genetic testing for hereditary cancer risks. There is no reported Ashkenazi Jewish ancestry. There is no known consanguinity.  GENETIC COUNSELING ASSESSMENT: Ms. Jaffer is a 43 y.o. female with a personal and family history which is somewhat suggestive of a hereditary cancer syndrome and predisposition to cancer. We, therefore, discussed and recommended the following at today's visit.   DISCUSSION: We discussed that 5 - 10% of breast cancer is hereditary, with most cases associated with the BRCA1/BRCA2.  There are other genes that can be associated with hereditary breast cancer syndromes.  These include PALB2, ATM, CHEK2.    We reviewed the characteristics, features and inheritance patterns of hereditary cancer syndromes. We also discussed genetic testing, including the appropriate family members to test, the process of testing, insurance coverage and turn-around-time for results. We discussed the implications of a negative, positive and/or variant of uncertain significant result. In order to get genetic test results in a timely manner so that Ms.  Pattison can use these genetic test results for surgical decisions, we recommended Ms. Manigo pursue genetic testing for the [facility] Breast Cancer STAT Panel. Once complete, we recommend Ms. Riede pursue reflex genetic testing to the Common Hereditary Cancers gene panel.   The STAT Breast cancer panel offered by [facility] includes sequencing and rearrangement analysis for the following 9 genes:  ATM, BRCA1, BRCA2, CDH1, CHEK2, PALB2, PTEN, STK11 and TP53.    The Common Hereditary Cancers Panel offered by [facility] includes sequencing and/or deletion duplication testing of the following 48 genes: APC, ATM, AXIN2, BARD1, BMPR1A, BRCA1, BRCA2, BRIP1, CDH1, CDKN2A (p14ARF), CDKN2A (p16INK4a), CKD4, CHEK2, CTNNA1, DICER1, EPCAM (Deletion/duplication testing only), GREM1 (promoter region  deletion/duplication testing only), KIT, MEN1, MLH1, MSH2, MSH3, MSH6, MUTYH, NBN, NF1, NHTL1, PALB2, PDGFRA, PMS2, POLD1, POLE, PTEN, RAD50, RAD51C, RAD51D, RNF43, SDHB, SDHC, SDHD, SMAD4, SMARCA4. STK11, TP53, TSC1, TSC2, and VHL.  The following genes were evaluated for sequence changes only: SDHA and HOXB13 c.251G>A variant only.  Based on Ms. Hail's personal and family history of cancer, she meets medical criteria for genetic testing. Despite that she meets criteria, she may still have an out of pocket cost.   PLAN: After considering the risks, benefits, and limitations, Ms. Stoneberg provided informed consent to pursue genetic testing and the blood sample was sent to [facility] for analysis of the STAT Breast Cancer Panel + Common Hereditary Cancers Panel. Initial results should be available within approximately 1 weeks' time, at which point they will be disclosed by telephone to Ms. Seda, as will any additional recommendations warranted by these results. Ms. Shaheed will receive a summary of her genetic counseling visit and a copy of her results once available. This information will also be available in  Epic.   Based on Ms. Orzechowski's family history, we recommended her maternal relatives have genetic counseling and testing. Ms. Mehl will let us know if we can be of any assistance in coordinating genetic counseling and/or testing for this family member.   Lastly, we encouraged Ms. Cargile to remain in contact with cancer genetics annually so that we can continuously update the family history and inform her of any changes in cancer genetics and testing that may be of benefit for this family.   Ms. [name] questions were answered to her satisfaction today. Our contact information was provided should additional questions or concerns arise. Thank you for the referral and allowing [place] to share in the care of your patient.   Faith Rogue, MS Genetic Counselor [email or web address] Phone: [phone]  The patient was seen for a total of 40 minutes in virtual genetic counseling.  Drs. Magrinat, Lindi Adie and/or Burr Medico were available for discussion regarding this case. A [employer] genetic counseling intern, Harrell Lark, was present for this case.

## 2019-01-01 NOTE — Progress Notes (Signed)
[place]  [phone] [phone]     ID: Tonya Whitehead DOB: 05-17-1976  MR#: [id number]  AUQ#:[id number]  Patient Care Team: Hayden Rasmussen, MD as PCP - General (Family Medicine) Maliq Pilley, Virgie Dad, MD as Consulting Physician (Oncology) Sheliah Hatch, MD as Consulting Physician (Surgical Oncology) Stann Ore, MD as Consulting Physician (Plastic Surgery) Stann Ore, MD (Plastic Surgery) Faith Rogue T as Counselor Chauncey Cruel, MD OTHER MD:  I connected with Tonya Whitehead on 01/01/19 at  3:00 PM EDT by video enabled telemedicine visit and verified that I am speaking with the correct person using two identifiers.   I discussed the limitations, risks, security and privacy concerns of performing an evaluation and management service by telemedicine and the availability of in-person appointments. I also discussed with the patient that there may be a patient responsible charge related to this service. The patient expressed understanding and agreed to proceed.   Other persons participating in the visit and their role in the encounter: husband Tonya Whitehead; Wilburn Mylar, scribe   Patients location: home  Providers location: [place]: estrogen receptor positive breast cancer  CURRENT TREATMENT: Definitive surgery pending   INTERVAL HISTORY: Tonya Whitehead is seen today for follow up of her estrogen receptor positive breast cancer.  Since her last visit, she underwent bilateral breast MRI at [facility] on 12/20/2018 showing: breast density category C; extensive left breast carcinoma extending into all 4 quadrants; no evidence of left axillary lymphadenopathy; no evidence of right breast malignancy.  She also underwent staging scans on 12/29/2018. Chest CT showed: no evidence of metastatic disease; multinodular goiter with dominant 4.1 cm left thyroid lobe nodule. Bone scan  was also negative for metastatic disease.  She is scheduled to undergo left mastectomy and SLN sampling with immediate reconstruction on 01/16/2019 under Drs. Glade Stanford and Leslie. At this point we are not requesting a port.  REVIEW OF SYSTEMS: Tonya Whitehead reports she is working from home. Husband Tonya Whitehead has been doing all the grocery shopping. The patient denies unusual headaches, visual changes, nausea, vomiting, stiff neck, dizziness, or gait imbalance. There has been no cough, phlegm production, or pleurisy, no chest pain or pressure, and no change in bowel or bladder habits. The patient denies fever, rash, bleeding, unexplained fatigue or unexplained weight loss. A detailed review of systems was otherwise entirely negative.   HISTORY OF CURRENT ILLNESS: Tonya Whitehead notes a history of reconstructive left breast surgery in 2002 while living in [place], [place]. That was for symmetry. The left breast accordingly had some scar tissue and she thought that was what she was noting until mid March, when she felt the left breast was changing more and the nipple seemed to be sinking subtly.  She brought this to Dr Dahlia Bailiff attention and underwent bilateral diagnostic mammography with tomography and bilateral breast ultrasonography at [facility] on 12/01/2018 showing: breast density category C; findings concerning for extensive malignancy throughout the left breast predominately involving the upper- and lower-outer quadrants.  The mass was palpable within the upper outer left breast and there was left nipple retraction as well as indentation along the inferior margin of the left breast.  Targeted ultrasound showed three masses measuring 5.6 cm (2 o'clock position), 4.0 cm (close to the nipple and contiguous to the prior mass), and 3.3 cm (6 o'clock position.).  Also noted was a borderline left axillary lymph node adjacent to the left axillary artery (and  so not easy to biopsy).  In the right breast was noted a  1.3 cm cyst.  On 12/08/2018 Tonya Whitehead proceeded to biopsy of 2 of the left breast areas in question. The pathology from this procedure ([id number]) showed: invasive mammary carcinoma, grade 2, in two cores (at 2 o'clock and 6 o'clock) with identical histomorphology; e-cadherin is negative, consistent with a lobular phenotype. Prognostic indicators significant for: estrogen receptor, 70% positive and progesterone receptor, 70% positive, both with strong staining intensity. Proliferation marker Ki67 at 3%. HER2 negative by immunohistochemistry (1+).  Note also the patient had a thyroid ultrasound 07/18/2018 showing bilateral solitary nodules, which were biopsied 08/09/2018.  Both showed follicular nodules, Bethesda category 2 (benign).  The patient's subsequent history is as detailed below.   PAST MEDICAL HISTORY: Past Medical History:  Diagnosis Date   Breast cancer ([facility] [place]) 12/08/2018   invasive lobular   Family history of bladder cancer    Family history of colon cancer    Family history of lung cancer    Family history of ovarian cancer     PAST SURGICAL HISTORY: Past Surgical History:  Procedure Laterality Date   ADENOIDECTOMY     as a child   BREAST EXCISIONAL BIOPSY Left 2002   reconstructive surgery on nipple to match right breast  Adenoidectomy as a child.   FAMILY HISTORY: Family History  Problem Relation Age of Onset   Lung cancer Mother 33       smoker   Ovarian cancer Mother 53   Colon cancer Maternal Aunt        68s   Bladder Cancer Paternal Grandfather        death was unrelated   Cancer Paternal Aunt        through [facility], blood cancer   As of May 2020 patient's father is alive at age 67. Patient's mother is also living at age 3. She has ovarian cancer, diagnosed in 2019, and lung cancer (stage IV), diagnosed in 2016. She tested negative for BRCA.  A maternal aunt had colon cancer in her 31s.  3 other siblings of the patient's mother have not had any  cancer.  On the paternal side the paternal grandmother had bladder cancer.  A paternal great aunt died from cancer of the pancreas.     GYNECOLOGIC HISTORY:  No LMP recorded. Menarche: 43 years old Age at first live birth: 43 years old GX P 3 LMP every month, 5 total days with 2 heavy Contraceptive no, husband with vasectomy. HRT n/a  Hysterectomy? no BSO? 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 Husband Tonya Whitehead is a physical therapist. He is biologically [place] and was adopted by a [place] family.  The patient lives at home with her husband and their 3 children: Tonya Whitehead is 76, Tonya Whitehead is 48, and  Tonya Whitehead, 82.  The patient attends a [employer].    ADVANCED DIRECTIVES: Husband Tonya Whitehead is her HCPOA   HEALTH MAINTENANCE: Social History   Tobacco Use   Smoking status: Not on file  Substance Use Topics   Alcohol use: Not on file   Drug use: Never     Colonoscopy: never done  PAP: not on file  Bone density: never done   Allergies  Allergen Reactions   Sulfa Antibiotics     Unknown reaction " I was told I had a reaction when I was a baby "    No current outpatient medications on file.  No current facility-administered medications for this visit.     OBJECTIVE: Young white woman in no acute distress  There were no vitals filed for this visit.   There is no height or weight on file to calculate BMI.   Wt Readings from Last 3 Encounters:  12/13/18 143 lb 11.2 oz (65.2 kg)      ECOG FS:0 - Asymptomatic   Left breast status post recent biopsy photographed 12/13/2018      LAB RESULTS:  CMP  No results found for: NA, K, CL, CO2, GLUCOSE, BUN, CREATININE, CALCIUM, PROT, ALBUMIN, AST, ALT, ALKPHOS, BILITOT, GFRNONAA, GFRAA  No results found for: TOTALPROTELP, ALBUMINELP, A1GS, A2GS, BETS, BETA2SER, GAMS, MSPIKE, SPEI  No results found for: KPAFRELGTCHN, LAMBDASER, KAPLAMBRATIO  No results found for: WBC,  NEUTROABS, HGB, HCT, MCV, PLT  _0 @  No results found for: LABCA2  No components found for: RCBULA453  No results for input(s): INR in the last 168 hours.  No results found for: LABCA2  No results found for: MIW803  No results found for: OZY248  No results found for: GNO037  No results found for: CA2729  No components found for: HGQUANT  No results found for: CEA1 / No results found for: CEA1   No results found for: AFPTUMOR  No results found for: CHROMOGRNA  No results found for: PSA1  No visits with results within 3 Day(s) from this visit.  Latest known visit with results is:  No results found for any previous visit.    (this displays the last labs from the last 3 days)  No results found for: TOTALPROTELP, ALBUMINELP, A1GS, A2GS, BETS, BETA2SER, GAMS, MSPIKE, SPEI (this displays SPEP labs)  No results found for: KPAFRELGTCHN, LAMBDASER, KAPLAMBRATIO (kappa/lambda light chains)  No results found for: HGBA, HGBA2QUANT, HGBFQUANT, HGBSQUAN (Hemoglobinopathy evaluation)   No results found for: LDH  No results found for: IRON, TIBC, IRONPCTSAT (Iron and TIBC)  No results found for: FERRITIN  Urinalysis No results found for: COLORURINE, APPEARANCEUR, LABSPEC, PHURINE, GLUCOSEU, HGBUR, BILIRUBINUR, KETONESUR, PROTEINUR, UROBILINOGEN, NITRITE, LEUKOCYTESUR   STUDIES: Ct Chest W Contrast  Result Date: 12/29/2018 CLINICAL DATA:  New diagnosis of left breast cancer. Initial staging. EXAM: CT CHEST WITH CONTRAST TECHNIQUE: Multidetector CT imaging of the chest was performed during intravenous contrast administration. CONTRAST:  39m OMNIPAQUE IOHEXOL 300 MG/ML  SOLN COMPARISON:  None. FINDINGS: Cardiovascular: Normal heart size. No significant pericardial effusion/thickening. Great vessels are normal in course and caliber. No central pulmonary emboli. Mediastinum/Nodes: Multinodular goiter with dominant heterogeneous 4.1 cm left thyroid lobe nodule.  Unremarkable esophagus. No pathologically enlarged axillary, mediastinal or hilar lymph nodes. Lungs/Pleura: No pneumothorax. No pleural effusion. No acute consolidative airspace disease, lung masses or significant pulmonary nodules. Upper abdomen: Simple 1.2 cm central liver cyst. Musculoskeletal:  No aggressive appearing focal osseous lesions. IMPRESSION: 1. No evidence of metastatic disease in the chest. 2. Multinodular goiter with dominant 4.1 cm left thyroid lobe nodule. Thyroid ultrasound correlation is indicated. This follows ACR consensus guidelines: Managing Incidental Thyroid Nodules Detected on Imaging: White Paper of the ACR Incidental Thyroid Findings Committee. J Am Coll Radiol 2015; 12:143-150. Electronically Signed   By: JIlona SorrelM.D.   On: 12/29/2018 12:45   Nm Bone Scan Whole Body  Result Date: 12/29/2018 CLINICAL DATA:  43year old female with breast cancer staging. Denies bone pain. EXAM: NUCLEAR MEDICINE WHOLE BODY BONE SCAN TECHNIQUE: Whole body anterior and posterior images were obtained approximately 3 hours after intravenous injection of radiopharmaceutical. RADIOPHARMACEUTICALS:  21.5  mCi Technetium-53mMDP IV COMPARISON:  CT chest earlier today. FINDINGS: There is radiotracer activity visible in both kidneys as well as the urinary bladder. Mild perineum contamination. Radiotracer activity is homogeneous throughout the axial skeleton including the skull and pelvis. Similar homogeneous radiotracer activity in the visible appendicular skeleton. IMPRESSION: No findings specific for metastatic disease to bone. Electronically Signed   By: HGenevie AnnM.D.   On: 12/29/2018 17:33   Mr Breast Bilateral W Wo Contrast Inc Cad  Result Date: 12/20/2018 CLINICAL DATA:  Patient with recently diagnosed left breast carcinoma, biopsied on 12/01/2018. Patient with a shrunken left breast and retracted nipple. Reported history of a nipple reconstruction performed in 2002 on the left breast. Diagnostic  left breast imaging, including ultrasound, demonstrated extensive left breast masses and a borderline, deep left axillary lymph node with a cortical thickness of 3 mm. LABS:  Not drawn at time of imaging. EXAM: BILATERAL BREAST MRI WITH AND WITHOUT CONTRAST TECHNIQUE: Multiplanar, multisequence MR images of both breasts were obtained prior to and following the intravenous administration of 7 ml of Gadavist Three-dimensional MR images were rendered by post-processing of the original MR data on an independent workstation. The three-dimensional MR images were interpreted, and findings are reported in the following complete MRI report for this study. Three dimensional images were evaluated at the independent DynaCad workstation COMPARISON:  Previous exam(s). FINDINGS: Breast composition: c. Heterogeneous fibroglandular tissue. Background parenchymal enhancement: Moderate. Right breast: No suspicious mass or abnormal enhancement. Cyst reported on the prior ultrasound of the right breast lies centrally, most likely an oil cyst based on MRI signal characteristics. Left breast: There is extensive abnormal enhancement consistent with a conglomeration of masses and intervening non mass enhancement that extends throughout much of the left breast centered in the middle to posterior third, slightly lateral to midline, but spanning all 4 quadrants. The abnormal enhancement spans 4.9 cm from anterior to posterior, 6.9 cm right to left and 7.2 cm cranial to caudal. The left breast is retracted, along with the nipple. Abnormal enhancement extends to the nipple. No skin thickening. No evidence of chest wall invasion. Biopsy clip artifact noted along the posterior upper outer aspect of the abnormal enhancement and along the inferior aspect of the abnormal enhancement. Lymph nodes: No abnormal arch to abnormal appearing lymph nodes. Specifically, no left axillary lymph nodes appear to be thickened or enlarged or of abnormal morphology.  Ancillary findings:  None. IMPRESSION: 1. Extensive left breast carcinoma extending into all 4 quadrants. No MRI evidence of left axillary lymphadenopathy. 2. No evidence of right breast malignancy. RECOMMENDATION: 1. Treatment as planned for the known left breast malignancy. BI-RADS CATEGORY  6: Known biopsy-proven malignancy. Electronically Signed   By: DLajean ManesM.D.   On: 12/20/2018 13:20   Mm Clip Placement Left  Result Date: 12/08/2018 CLINICAL DATA:  Patient status post ultrasound-guided core needle biopsy 2 left breast. EXAM: DIAGNOSTIC LEFT MAMMOGRAM POST ULTRASOUND BIOPSY COMPARISON:  Previous exam(s). FINDINGS: Mammographic images were obtained following ultrasound guided biopsy of left breast masses. Site 1: Upper-outer left breast: Ribbon shaped marking clip: In appropriate position. Site 2: Inferior left breast: Coil shaped clip: In appropriate position. IMPRESSION: Appropriate position biopsy marking clip status post ultrasound-guided biopsy 2 left breast masses. Final Assessment: Post Procedure Mammograms for Marker Placement Electronically Signed   By: DLovey NewcomerM.D.   On: 12/08/2018 10:13   [place]Lt Breast Bx W Loc Dev 1st Lesion Img Bx Spec UKoreaGuide  Addendum Date:  12/13/2018   ADDENDUM REPORT: 12/12/2018 17:37 ADDENDUM: Pathology revealed GRADE II INVASIVE MAMMARY CARCINOMA, INTERMEDIATE GRADE MAMMARY CARCINOMA IN SITU of the LEFT breast, 2 o'clock. This was found to be concordant by Dr. Lovey Newcomer. Pathology revealed GRADE II INVASIVE MAMMARY CARCINOMA, INTERMEDIATE GRADE MAMMARY CARCINOMA IN SITU of the LEFT breast, 6 o'clock. This was found to be concordant by Dr. Lovey Newcomer. Pathology report and recommendations were faxed to Dr. Horald Pollen on Dec 11, 2018. Per provider request, pathology results were discussed with the patient by Dr. Horald Pollen of [facility] at her office. The patient was then contacted by Stacie Acres RN. She reported doing well  after the biopsies with tenderness at the sites. Post biopsy instructions and care were reviewed and questions were answered. The patient was encouraged to call [facility] for any additional concerns. Bilateral breast MRI is recommended to further evaluate extent of disease given dense breast tissue and shrunken left breast. The LEFT axillary lymph node is borderline in size and deep within the axilla, adjacent to an artery. This is likely not accessible to percutaneous biopsy. Recommend attention to the axilla on MRI. Bilateral breast MRI is scheduled for Dec 23, 2018. Per patient requests, Oncologist consultation with Dr. Lurline Del of [place] is Dec 13, 2018 and surgical consultation with Dr. Natividad Brood of [place] is December 27, 2018. Pathology results reported by Stacie Acres, RN on 12/12/2018. Electronically Signed   By: Franki Cabot M.D.   On: 12/12/2018 17:37   Result Date: 12/13/2018 CLINICAL DATA:  Patient with indeterminate left breast masses. EXAM: ULTRASOUND GUIDED LEFT BREAST CORE NEEDLE BIOPSY COMPARISON:  Previous exam(s). FINDINGS: I met with the patient and we discussed the procedure of ultrasound-guided biopsy, including benefits and alternatives. We discussed the high likelihood of a successful procedure. We discussed the risks of the procedure, including infection, bleeding, tissue injury, clip migration, and inadequate sampling. Informed written consent was given. The usual time-out protocol was performed immediately prior to the procedure. Site 1: 2 o'clock left breast: Ribbon shaped marking clip. Lesion quadrant: Upper outer quadrant Using sterile technique and 1% Lidocaine as local anesthetic, under direct ultrasound visualization, a 14 gauge spring-loaded device was used to perform biopsy of left breast mass 2 o'clock position using a lateral approach. At the conclusion of the procedure a ribbon shaped tissue marker clip  was deployed into the biopsy cavity. Follow up 2 view mammogram was performed and dictated separately. Site 2: 6 o'clock position left breast: Coil shaped marking clip. Lesion quadrant: Lower inner quadrant Using sterile technique and 1% Lidocaine as local anesthetic, under direct ultrasound visualization, a 14 gauge spring-loaded device was used to perform biopsy of left breast mass 6 o'clock position using a lateral approach. At the conclusion of the procedure a coil shaped tissue marker clip was deployed into the biopsy cavity. Follow up 2 view mammogram was performed and dictated separately. IMPRESSION: Ultrasound guided biopsy of left breast masses as above. No apparent complications. Electronically Signed: By: Lovey Newcomer M.D. On: 12/08/2018 10:15   [place] Lt Breast Bx W Loc Dev Ea Add Lesion Img Bx Spec US Guide  Addendum Date: 12/13/2018   ADDENDUM REPORT: 12/12/2018 17:37 ADDENDUM: Pathology revealed GRADE II INVASIVE MAMMARY CARCINOMA, INTERMEDIATE GRADE MAMMARY CARCINOMA IN SITU of the LEFT breast, 2 o'clock. This was found to be concordant by Dr. Lovey Newcomer. Pathology revealed GRADE II INVASIVE MAMMARY CARCINOMA, INTERMEDIATE  GRADE MAMMARY CARCINOMA IN SITU of the LEFT breast, 6 o'clock. This was found to be concordant by Dr. Lovey Newcomer. Pathology report and recommendations were faxed to Dr. Horald Pollen on Dec 11, 2018. Per provider request, pathology results were discussed with the patient by Dr. Horald Pollen of [facility] at her office. The patient was then contacted by Stacie Acres RN. She reported doing well after the biopsies with tenderness at the sites. Post biopsy instructions and care were reviewed and questions were answered. The patient was encouraged to call [facility] for any additional concerns. Bilateral breast MRI is recommended to further evaluate extent of disease given dense breast tissue and shrunken left breast. The LEFT  axillary lymph node is borderline in size and deep within the axilla, adjacent to an artery. This is likely not accessible to percutaneous biopsy. Recommend attention to the axilla on MRI. Bilateral breast MRI is scheduled for Dec 23, 2018. Per patient requests, Oncologist consultation with Dr. Lurline Del of [place] is Dec 13, 2018 and surgical consultation with Dr. Natividad Brood of [place] is December 27, 2018. Pathology results reported by Stacie Acres, RN on 12/12/2018. Electronically Signed   By: Franki Cabot M.D.   On: 12/12/2018 17:37   Result Date: 12/13/2018 CLINICAL DATA:  Patient with indeterminate left breast masses. EXAM: ULTRASOUND GUIDED LEFT BREAST CORE NEEDLE BIOPSY COMPARISON:  Previous exam(s). FINDINGS: I met with the patient and we discussed the procedure of ultrasound-guided biopsy, including benefits and alternatives. We discussed the high likelihood of a successful procedure. We discussed the risks of the procedure, including infection, bleeding, tissue injury, clip migration, and inadequate sampling. Informed written consent was given. The usual time-out protocol was performed immediately prior to the procedure. Site 1: 2 o'clock left breast: Ribbon shaped marking clip. Lesion quadrant: Upper outer quadrant Using sterile technique and 1% Lidocaine as local anesthetic, under direct ultrasound visualization, a 14 gauge spring-loaded device was used to perform biopsy of left breast mass 2 o'clock position using a lateral approach. At the conclusion of the procedure a ribbon shaped tissue marker clip was deployed into the biopsy cavity. Follow up 2 view mammogram was performed and dictated separately. Site 2: 6 o'clock position left breast: Coil shaped marking clip. Lesion quadrant: Lower inner quadrant Using sterile technique and 1% Lidocaine as local anesthetic, under direct ultrasound visualization, a 14 gauge spring-loaded device was used to perform biopsy  of left breast mass 6 o'clock position using a lateral approach. At the conclusion of the procedure a coil shaped tissue marker clip was deployed into the biopsy cavity. Follow up 2 view mammogram was performed and dictated separately. IMPRESSION: Ultrasound guided biopsy of left breast masses as above. No apparent complications. Electronically Signed: By: Lovey Newcomer M.D. On: 12/08/2018 10:15    ELIGIBLE FOR AVAILABLE RESEARCH PROTOCOL: no  ASSESSMENT: 43 y.o. [place] woman status post left breast biopsy x2 on 12/08/2018, for multicentric invasive lobular breast cancer, grade 2, estrogen and progesterone receptor positive, HER-2 nonamplified, with an MIB-1 of 3%  (a) bilateral breast MRI 12/20/2018 shows multiple masses throughout the left breast, with nipple retraction, but no evidence of chest wall invasion, no abnormal left axillary lymph nodes, and no findings of concern in the right breast  (b) chest CT scan and bone scan 12/29/2018 show only an incidental goiter  (1) scheduled for definitive surgery 01/16/2019  (2) adjuvant chemotherapy to follow  (3)  adjuvant radiation planned  (4) antiestrogens to follow at the completion of local treatment  (5) genetics testing 01/02/2019    PLAN: Shukri has met with the team at [facility] and is very pleased with the surgical plan.  We again reviewed some of the different types of reconstruction and there is still a little uncertainty whether she will have 1 or both breasts removed but she should have the genetics results available by mid next week, a week before the definitive surgery so she can opt for bilateral mastectomies if the genetics are not favorable.  I will arrange for her to bring a disc with the films of her chest CT scan and bone scan so they can be reviewed at [facility] but she is aware there is no evidence of that metastasis.  We also looked at the very nice fat line between the tumor in the chest wall on the right.  She will need  adjuvant chemotherapy and we will discuss the specific agents once we have the final pathology from her surgery.  Tentatively we will discuss that towards the end of July.  Note that she plans the beach trip the first week in August so chemotherapy likely will start shortly after that  They are keeping appropriate COVID precautions.  She knows to call for any other issues that may develop before the next visit.      Chauncey Cruel, MD   01/01/2019 3:26 PM Medical Oncology and Hematology [facility] [address] [place], [place] [address] Tel. [phone]    Fax. [phone]   This document serves as a record of services personally performed by Lurline Del, MD. It was created on his behalf by Wilburn Mylar, a trained medical scribe. The creation of this record is based on the scribe's personal observations and the provider's statements to them.   I, Lurline Del MD, have reviewed the above documentation for accuracy and completeness, and I agree with the above.

## 2019-01-01 NOTE — Progress Notes (Signed)
RD working remotely.  Contacted patient but she was not available. Left message with my contact information requesting specific information patient would like regarding nutrition.

## 2019-01-02 ENCOUNTER — Other Ambulatory Visit: Payer: Self-pay

## 2019-01-02 ENCOUNTER — Inpatient Hospital Stay: Payer: 59

## 2019-01-02 ENCOUNTER — Encounter: Payer: Self-pay | Admitting: *Deleted

## 2019-01-03 ENCOUNTER — Ambulatory Visit: Payer: 59

## 2019-01-03 NOTE — Progress Notes (Signed)
Nutrition  RD working remotely.  Called patient for follow-up from message left on 6/8 by Ernestene Kiel, RD.  No answer.  Left another message with contact number.    Dresden Ament B. Zenia Resides, [place], [place] Registered Dietitian [phone] (pager)

## 2019-01-09 ENCOUNTER — Telehealth: Payer: Self-pay | Admitting: Licensed Clinical Social Worker

## 2019-01-09 NOTE — Telephone Encounter (Signed)
Revealed negative initial genetic testing on the 9 gene STAT panel. We are still waiting on the rest of the results and I will call her again and discuss in more detail once those are back.

## 2019-01-10 ENCOUNTER — Encounter: Payer: Self-pay | Admitting: Licensed Clinical Social Worker

## 2019-01-10 ENCOUNTER — Ambulatory Visit: Payer: Self-pay | Admitting: Licensed Clinical Social Worker

## 2019-01-10 ENCOUNTER — Telehealth: Payer: Self-pay | Admitting: Licensed Clinical Social Worker

## 2019-01-10 DIAGNOSIS — C50812 Malignant neoplasm of overlapping sites of left female breast: Secondary | ICD-10-CM

## 2019-01-10 DIAGNOSIS — Z800 Family history of malignant neoplasm of digestive organs: Secondary | ICD-10-CM

## 2019-01-10 DIAGNOSIS — Z8041 Family history of malignant neoplasm of ovary: Secondary | ICD-10-CM

## 2019-01-10 DIAGNOSIS — Z170 Estrogen receptor positive status [ER+]: Secondary | ICD-10-CM

## 2019-01-10 DIAGNOSIS — Z1379 Encounter for other screening for genetic and chromosomal anomalies: Secondary | ICD-10-CM | POA: Insufficient documentation

## 2019-01-10 DIAGNOSIS — Z8052 Family history of malignant neoplasm of bladder: Secondary | ICD-10-CM

## 2019-01-10 DIAGNOSIS — Z801 Family history of malignant neoplasm of trachea, bronchus and lung: Secondary | ICD-10-CM

## 2019-01-10 NOTE — Telephone Encounter (Signed)
Revealed negative genetic testing. This normal result is reassuring and indicates that it is unlikely Tonya Whitehead's cancer is due to a hereditary cause.  It is unlikely that there is an increased risk of another cancer due to a mutation in one of these genes.  However, genetic testing is not perfect, and cannot definitively rule out a hereditary cause.  It will be important for her to keep in contact with genetics to learn if any additional testing may be needed in the future.

## 2019-01-10 NOTE — Progress Notes (Signed)
HPI:  Ms. Uplinger was previously seen in the [place] due to a personal and family history of cancer and concerns regarding a hereditary predisposition to cancer. Please refer to our prior cancer genetics clinic note for more information regarding our discussion, assessment and recommendations, at the time. Ms. Huberty recent genetic test results were disclosed to her, as were recommendations warranted by these results. These results and recommendations are discussed in more detail below.  FAMILY HISTORY:  We obtained a detailed, 4-generation family history.  Significant diagnoses are listed below: Family History  Problem Relation Age of Onset   Lung cancer Mother 83       smoker   Ovarian cancer Mother 45   Colon cancer Maternal Aunt        28s   Bladder Cancer Paternal Grandfather        death was unrelated   Cancer Paternal Aunt        through PGM, blood cancer   Ms. Klimowicz has 2 daughters and 1 son, no cancers. She has two maternal half brothers, one maternal half sister, no cancers.  Ms. Otani mother is living at 45, was diagnosed with lung cancer at 60, history of smoking. She was also diagnosed with ovarian cancer at 16. She had genetic testing through [facility], which was negative and the patient emailed me a copy of this report. The results included 2 VUS's, one in the APC gene the other in the POLE gene. The patient has 2 maternal uncles, 2 maternal aunts. One of her aunts had colon cancer in her 21s and died in her 42s. No cancers in cousins. Her maternal grandmother died at 80, maternal grandfather died at 62.  Ms. Hineman's father is living at 39. The patient has 1 paternal aunt, 1 paternal uncle, no cancers. No cancers in paternal cousins. Her paternal grandfather died at 43, he had bladder cancer diagnosed at 25 and history of smoking. Paternal grandmother is living at 87. This grandmother has a twin who had a blood cancer in her  73s, died in her 7s.  Ms. Bothun is aware of previous family history of genetic testing for hereditary cancer risks. There is no reported Ashkenazi Jewish ancestry. There is no known consanguinity.  GENETIC TEST RESULTS: Genetic testing reported out on 01/09/2019 through the [facility] STAT + Common Hereditary cancer panel found no pathogenic mutations.  The STAT Breast cancer panel offered by [facility] includes sequencing and rearrangement analysis for the following 9 genes:  ATM, BRCA1, BRCA2, CDH1, CHEK2, PALB2, PTEN, STK11 and TP53.    The Common Hereditary Cancers Panel offered by [facility] includes sequencing and/or deletion duplication testing of the following 47 genes: APC, ATM, AXIN2, BARD1, BMPR1A, BRCA1, BRCA2, BRIP1, CDH1, CDKN2A (p14ARF), CDKN2A (p16INK4a), CKD4, CHEK2, CTNNA1, DICER1, EPCAM (Deletion/duplication testing only), GREM1 (promoter region deletion/duplication testing only), KIT, MEN1, MLH1, MSH2, MSH3, MSH6, MUTYH, NBN, NF1, NHTL1, PALB2, PDGFRA, PMS2, POLD1, POLE, PTEN, RAD50, RAD51C, RAD51D,SDHB, SDHC, SDHD, SMAD4, SMARCA4. STK11, TP53, TSC1, TSC2, and VHL.  The following genes were evaluated for sequence changes only: SDHA and HOXB13 c.251G>A variant only.  The test report has been scanned into EPIC and is located under the Molecular Pathology section of the Results Review tab.  A portion of the result report is included below for reference.     We discussed with Ms. Sappington that because current genetic testing is not perfect, it is possible there may be a gene mutation in one of these genes  that current testing cannot detect, but that chance is small.  We also discussed, that there could be another gene that has not yet been discovered, or that we have not yet tested, that is responsible for the cancer diagnoses in the family. It is also possible there is a hereditary cause for the cancer in the family that Ms. Sean did not inherit and therefore was not identified in her  testing.  Therefore, it is important to remain in touch with cancer genetics in the future so that we can continue to offer Ms. Mesmer the most up to date genetic testing.   ADDITIONAL GENETIC TESTING: We discussed with Ms. Lonsway that her genetic testing was fairly extensive.  If there are genes identified to increase cancer risk that can be analyzed in the future, we would be happy to discuss and coordinate this testing at that time.    CANCER SCREENING RECOMMENDATIONS: Ms. Magistro test result is considered negative (normal).  This means that we have not identified a hereditary cause for her  personal and family history of cancer at this time. Most cancers happen by chance and this negative test suggests that her cancer may fall into this category.    While reassuring, this does not definitively rule out a hereditary predisposition to cancer. It is still possible that there could be genetic mutations that are undetectable by current technology. There could be genetic mutations in genes that have not been tested or identified to increase cancer risk.  Therefore, it is recommended she continue to follow the cancer management and screening guidelines provided by her oncology and primary healthcare provider.   An individual's cancer risk and medical management are not determined by genetic test results alone. Overall cancer risk assessment incorporates additional factors, including personal medical history, family history, and any available genetic information that may result in a personalized plan for cancer prevention and surveillance  RECOMMENDATIONS FOR FAMILY MEMBERS:  Relatives in this family might be at some increased risk of developing cancer, over the general population risk, simply due to the family history of cancer.  We recommended female relatives in this family have a yearly mammogram beginning at age 42, or 48 years younger than the earliest onset of cancer, an annual clinical breast  exam, and perform monthly breast self-exams. Female relatives in this family should also have a gynecological exam as recommended by their primary provider. All family members should have a colonoscopy by age 35, or as directed by their physicians.  It is also possible there is a hereditary cause for the cancer in Ms. Orrico's family that she did not inherit and therefore was not identified in her.  Based on Ms. Caicedo's family history, we recommended her maternal relatives have genetic counseling and testing. Ms. Abundis will let us know if we can be of any assistance in coordinating genetic counseling and/or testing for these family members.  FOLLOW-UP: Lastly, we discussed with Ms. Burdine that cancer genetics is a rapidly advancing field and it is possible that new genetic tests will be appropriate for her and/or her family members in the future. We encouraged her to remain in contact with cancer genetics on an annual basis so we can update her personal and family histories and let her know of advances in cancer genetics that may benefit this family.   Our contact number was provided. Ms. Delosreyes questions were answered to her satisfaction, and she knows she is welcome to call us at anytime with additional questions or  concerns.   Faith Rogue, MS Genetic Counselor [email or web address] Phone: [phone]

## 2019-01-25 ENCOUNTER — Other Ambulatory Visit: Payer: Self-pay | Admitting: Oncology

## 2019-01-30 DIAGNOSIS — Z9012 Acquired absence of left breast and nipple: Secondary | ICD-10-CM | POA: Insufficient documentation

## 2019-02-20 ENCOUNTER — Telehealth: Payer: Self-pay | Admitting: Oncology

## 2019-02-20 NOTE — Progress Notes (Signed)
[place]  [phone] [phone]     ID: Tonya Whitehead DOB: [birth year]  MR#: [id number]  WHQ#:[id number]  Patient Care Team: Hayden Rasmussen, MD as PCP - General (Family Medicine) Insiya Oshea, Virgie Dad, MD as Consulting Physician (Oncology) Sheliah Hatch, MD as Consulting Physician (Surgical Oncology) Stann Ore, MD as Consulting Physician (Plastic Surgery) Faith Rogue T as Counselor Chauncey Cruel, MD OTHER MD:  I connected with Tae Robak on 02/21/19 at 11:30 AM EDT by video enabled telemedicine visit and verified that I am speaking with the correct person using two identifiers.   I discussed the limitations, risks, security and privacy concerns of performing an evaluation and management service by telemedicine and the availability of in-person appointments. I also discussed with the patient that there may be a patient responsible charge related to this service. The patient expressed understanding and agreed to proceed.   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  Since her last visit, her genetics testing results came back and were negative.  She proceeded to left skin sparing mastectomy on 01/16/2019 at [facility]. Pathology from the procedure ([id number]) showed:  1. Left Breast  - invasive multifocal lobular carcinoma, grade 2, 7 cm;   - negative resection margins;   - direct invasion of nipple dermis;   - lobular carcinoma in situ.  - ER+ /PR+ /Her2- 2. Left Axillary Lymph Nodes (6)  - one out of six positive for metastatic carcinoma  (1/6).   REVIEW OF SYSTEMS: Tonya Whitehead reports her surgery went well. She states it was weird to not have her husband with her. She had drains in place for about 2 weeks. She reports recovery went smoothly. She is taking a vacation to the beach on Saturday. She also reports she was found to have basal cell carcinoma on her forehead. A detailed review of systems was otherwise entirely negative.    HISTORY OF CURRENT ILLNESS: From the original intake note:  Tonya Whitehead notes a history of reconstructive left breast surgery in 2002 while living in [place], [place]. That was for symmetry. The left breast accordingly had some scar tissue and she thought that was what she was noting until mid March, when she felt the left breast was changing more and the nipple seemed to be sinking subtly.  She brought this to Dr Dahlia Bailiff attention and underwent bilateral diagnostic mammography with tomography and bilateral breast ultrasonography at [facility] on 12/01/2018 showing: breast density category C; findings concerning for extensive malignancy throughout the left breast predominately involving the upper- and lower-outer quadrants.  The mass was palpable within the upper outer left breast and there was left nipple retraction as well as indentation along the inferior margin of the left breast.  Targeted ultrasound showed three masses measuring 5.6 cm (2 o'clock position), 4.0 cm (close to the nipple and contiguous to the prior mass), and 3.3 cm (6 o'clock position.).  Also noted was a borderline left axillary lymph node adjacent to the left axillary artery (and so not easy to biopsy).  In the right breast was noted a 1.3 cm cyst.  On 12/08/2018 Tonya Whitehead proceeded to biopsy of 2 of  the left breast areas in question. The pathology from this procedure ([id number]) showed: invasive mammary carcinoma, grade 2, in two cores (at 2 o'clock and 6 o'clock) with identical histomorphology; e-cadherin is negative, consistent  with a lobular phenotype. Prognostic indicators significant for: estrogen receptor, 70% positive and progesterone receptor, 70% positive, both with strong staining intensity. Proliferation marker Ki67 at 3%. HER2 negative by immunohistochemistry (1+).  Note also the patient had a thyroid ultrasound 07/18/2018 showing bilateral solitary nodules, which were biopsied 08/09/2018.  Both showed follicular nodules, Bethesda category 2 (benign).  The patient's subsequent history is as detailed below.   PAST MEDICAL HISTORY: Past Medical History:  Diagnosis Date   Breast cancer ([place]) 12/08/2018   invasive lobular   Family history of bladder cancer    Family history of colon cancer    Family history of lung cancer    Family history of ovarian cancer     PAST SURGICAL HISTORY: Past Surgical History:  Procedure Laterality Date   ADENOIDECTOMY     as a child   BREAST EXCISIONAL BIOPSY Left 2002   reconstructive surgery on nipple to match right breast  Adenoidectomy as a child.   FAMILY HISTORY: Family History  Problem Relation Age of Onset   Lung cancer Mother 74       smoker   Ovarian cancer Mother 58   Colon cancer Maternal Aunt        71s   Bladder Cancer Paternal Grandfather        death was unrelated   Cancer Paternal Aunt        through [facility], blood cancer   As of May 2020 patient's father is alive at age 35. Patient's mother is also living at age 54. She has ovarian cancer, diagnosed in 2019, and lung cancer (stage IV), diagnosed in 2016. She tested negative for BRCA.  A maternal aunt had colon cancer in her 74s.  3 other siblings of the patient's mother have not had any cancer.  On the paternal side the paternal grandmother had bladder cancer.  A paternal great aunt died from cancer of the pancreas.     GYNECOLOGIC HISTORY:  No LMP recorded. Menarche: 43 years old Age at first live birth: 43 years old GX P 3 LMP every month, 5 total days with 2  heavy Contraceptive no, husband with vasectomy. HRT n/a  Hysterectomy? no BSO? 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 Husband Tonya Whitehead is a physical therapist. He is biologically [place] and was adopted by a [place] family.  The patient lives at home with her husband and their 3 children: Tonya Whitehead is 38, Tonya Whitehead is 80, and  Tonya Whitehead, 24.  The patient attends a [employer].    ADVANCED DIRECTIVES: Husband Tonya Whitehead is her HCPOA   HEALTH MAINTENANCE: Social History   Tobacco Use   Smoking status: Not on file  Substance Use Topics   Alcohol use: Not on file   Drug use: Never     Colonoscopy: never done  PAP: not on file  Bone density: never done   Allergies  Allergen Reactions   Sulfa Antibiotics     Unknown reaction " I was told I had a reaction when I was a baby "    No current outpatient medications on file.   No current facility-administered medications for this visit.     OBJECTIVE: Young white woman who appears stated age  There were no vitals filed  for this visit.   There is no height or weight on file to calculate BMI.   Wt Readings from Last 3 Encounters:  12/13/18 143 lb 11.2 oz (65.2 kg)      ECOG FS:1 - Symptomatic but completely ambulatory   LAB RESULTS:  CMP  No results found for: NA, K, CL, CO2, GLUCOSE, BUN, CREATININE, CALCIUM, PROT, ALBUMIN, AST, ALT, ALKPHOS, BILITOT, GFRNONAA, GFRAA  No results found for: TOTALPROTELP, ALBUMINELP, A1GS, A2GS, BETS, BETA2SER, GAMS, MSPIKE, SPEI  No results found for: KPAFRELGTCHN, LAMBDASER, KAPLAMBRATIO  No results found for: WBC, NEUTROABS, HGB, HCT, MCV, PLT  '@LASTCHEMISTRY' @  No results found for: LABCA2  No components found for: AYTKZS010  No results for input(s): INR in the last 168 hours.  No results found for: LABCA2  No results found for: XNA355  No results found for: DDU202  No results found for: RKY706  No results found for:  CA2729  No components found for: HGQUANT  No results found for: CEA1 / No results found for: CEA1   No results found for: AFPTUMOR  No results found for: CHROMOGRNA  No results found for: PSA1  No visits with results within 3 Day(s) from this visit.  Latest known visit with results is:  No results found for any previous visit.    (this displays the last labs from the last 3 days)  No results found for: TOTALPROTELP, ALBUMINELP, A1GS, A2GS, BETS, BETA2SER, GAMS, MSPIKE, SPEI (this displays SPEP labs)  No results found for: KPAFRELGTCHN, LAMBDASER, KAPLAMBRATIO (kappa/lambda light chains)  No results found for: HGBA, HGBA2QUANT, HGBFQUANT, HGBSQUAN (Hemoglobinopathy evaluation)   No results found for: LDH  No results found for: IRON, TIBC, IRONPCTSAT (Iron and TIBC)  No results found for: FERRITIN  Urinalysis No results found for: COLORURINE, APPEARANCEUR, LABSPEC, PHURINE, GLUCOSEU, HGBUR, BILIRUBINUR, [place], [place], [place], NITRITE, [place]   STUDIES: Case Report Image Cytometry                  Case: [id number]                 Authorizing Provider: Hulan Amato, MD  Collected:      01/16/2019 1200       Ordering Location:   [facility] Received:      01/24/2019 1531       Pathologist:      Hulan Amato, MD                           Specimen:  Breast, Left, [phone]                               [facility]   Tissue Type  Left breast, simple mastectomy containing invasive multifocal lobular carcinoma ([id number], Block D6; Cold Ischemia Time: 2 hours, 28 minutes, Formalin Fixation Time: 23 hours, 30 minutes).  [facility]   INTERPRETATION  % of Cells Staining Intensity Score Interpretation  ER IHC 89 3+ POSITIVE  PR IHC 97 3+ POSITIVE  HER2/neu IHC 34 1+  NEGATIVE   [facility] Electronically signed by Hulan Amato, MD on 01/25/2019 at 3:10 PM  Estrogen Receptor IHC Analysis  Estrogen Receptor IHC Quantitative Analysis by AT2:   Percentage of tumor cells staining positively by AT2: 89%  Intensity of tumor cells staining positively by AT2: 3+  Allred Score (5 +  3 = 8)  Benign ductal epithelium stains positively (strong) and serves as a benign positive internal control.  [facility]   Progesterone Receptor IHC Analysis  Progesterone Receptor IHC Quantitative Analysis by AT2:  Percentage of tumor cells staining positively by AT2: 97%  Intensity of tumor cells staining positively by AT2: 3+  Allred Score (5 + 3 = 8)  Benign ductal epithelium stains positively (strong) and serves as a benign positive internal control.  [facility]   HER2/neu IHC Analysis  Her2 Immunohistochemical Quantitative Analysis by AT2:  Percentage of tumor cells staining positively by AT2: 34%  Intensity of tumor cells staining positively by AT2: 1+  Benign ductal epithelium stains appropriately and serves as a negative internal control.  [place]   Methodology  Pre-analytic variables:  Specimens should be immersed in formalin within 1 hour of the biopsy or resection procedure (less than 1 hour cold ischemia time). Specimens are fixed in 10% neutral buffered formalin for at least 6 hours up to 72 hours. For Her2 negative cases outside these limits, further evaluation by HER2 FISH or repeat testing on a different specimen may be considered. For ER and PR negative cases outside these limits, and with absent internal control staining, repeat testing on a different specimen is recommended.  Estrogen/Progesterone Receptor(s) IHC  DAKO ER/PR PHARMDX: The ER/PR pharmDx kit was used exactly as specified in the manufacturer's directions for use on the [employer]. Slides are  analyzed with computer-assisted technology using the AT2 Berenice Primas) and then nuclear algorithm with CAP/ASCO guidelines. The percentage of positive staining cells and the intensity of staining is given.   HER2/neu IHC  HER2/NEU IMMUNOHISTOCHEMISTRY: The FDA-approved Dako HercepTest PharmDx Kit was used exactly as specified in the manufacturer's directions for manual use. Slides are analyzed with computer-assisted technology using the AT2 ([employer]) and then membrane algorithm with CAP/ASCO guidelines. Staining is graded as follows: 0 = no staining or incomplete faint/barely perceptible staining in < or = 10% of tumor cells, 1+ = incomplete faint/barely perceptible staining in >10% of tumor cells, 2+ = circumferential incomplete and/or weak moderate staining in >10% of tumor cells OR circumferential complete intense staining in < or = 10% of cells, and 3+ = circumferential complete intense staining in >10% of tumor cells.  [place] All of the diagnostic evaluations on the enumerated specimens have been personally conducted by the pathologists involved in the care of this patient as indicated by the electronic signatures above.      Report Surgical Pathology                Case: [id number]                 Authorizing Provider: Sheliah Hatch, MD Collected:      01/16/2019 0850       Ordering Location:   [facility]     Received:      01/16/2019 1256       Pathologist:      Hulan Amato, MD                           Intraop:        Steele Berg, MD                            Specimens:  A) - Lymph  Node, Left axillary non sentinel lymph node #1                      B) - Lymph Node, Left Axillary, LEFT AXILLARY NON-SENTINEL LYMPH NODE #2 COUNT ZERO         C) - Lymph Node, Sentinel, LEFT AXILLARY  SENTINEL NODE #1                      D) - Tissue, Left mastectomy. Short stitch superior long stitch lateral               E) - Tissue, Left axillary lymph node dissection                      DIAGNOSIS  A. Left axillary non-sentinel lymph node #1, excision:  One lymph node with no evidence of malignancy (0/1).   B. Left axillary non-sentinel lymph node #2, excision:  One lymph node with no evidence of malignancy (0/1).   C. Left axillary sentinel lymph node #1, excision:  One lymph node with metastatic carcinoma (1/1).  Deposit size: 8 mm.  Extranodal invasion: not identified.   D. Left breast, simple mastectomy:  Invasive multifocal lobular carcinoma (70 mm, 6 mm, 1.2 mm) with negative resection margins (2.5 mm to posterior margin).  Direct invasion of nipple dermis is noted.  Other findings include: prior biopsy site, lobular carcinoma in situ (LCIS).  Breast biomarkers pending (block D6) with results to be reported separately by the Image Cytometry Laboratory.  See synoptic report for details.   E. Left axilla, node dissection:  Three lymph nodes with no evidence of malignancy (0/3).   Synoptic Report INVASIVE CARCINOMA OF THE BREAST: Resection (Breast Invasive - All Specimens)    SPECIMEN   Procedure:  Total mastectomy    Specimen Laterality:  Left   TUMOR   Tumor Site: Invasive Carcinoma:      Clock Position of Tumor Site:  1 o'clock     Clock Position of Tumor Site:  2 o'clock     Clock Position of Tumor Site:  11 o'clock     Clock Position of Tumor Site:  12 o'clock    Histologic Type:  Invasive lobular carcinoma    Glandular (Acinar) / Tubular Differentiation:  Score 3    Nuclear Pleomorphism:  Score 2    Mitotic Rate:  Score 1 (<=3 mitoses per mm2)    Overall Grade:  Grade 2 (scores of 6 or 7)    Tumor Size:  Greatest dimension of largest invasive focus  in Millimeters (mm): 70 Millimeters (mm)   Tumor Focality:  Multiple foci of invasive carcinoma     Number of Foci:  3     Sizes of Individual Foci:  70 mm (primary mass), 6 mm, 1.2 mm (lower inner quadrant)    Ductal Carcinoma In Situ (DCIS):  Not identified    Lobular Carcinoma In Situ (LCIS):  Present    Tumor Extent:      Skin Invasion:  Invasive carcinoma directly invades into the dermis or epidermis without skin ulceration (this does not change the T stage)     Nipple DCIS:  DCIS does not involve the nipple epidermis    Accessory Findings:      Treatment Effect:  No known presurgical therapy   MARGINS   Invasive Carcinoma Margins:  Uninvolved by invasive carcinoma     Distance from Closest Margin in Millimeters (  mm):  2.5 Millimeters (mm)    Closest Margin:  Posterior   LYMPH NODES   Regional Lymph Nodes:  Involved by tumor cells     Number of Lymph Nodes with Macrometastases (> 2 mm):  1     Number of Lymph Nodes with Micrometastases (> 0.2 mm to 2 mm and / or > 200 cells):  0     Number of Lymph Nodes with Isolated Tumor Cells (<= 0.2 mm and <= 200 cells):  0     Size of Largest Metastatic Deposit in Millimeters (mm):  8 Millimeters (mm)    Extranodal Extension:  Not identified     Number of Lymph Nodes Examined:  6     Number of Sentinel Nodes Examined:  1   PATHOLOGIC STAGE CLASSIFICATION (pTNM, AJCC 8th Edition)   TNM Descriptors:  m (multiple foci of invasive carcinoma)    Primary Tumor (Invasive Carcinoma) (pT):  pT3    Regional Lymph Nodes (pN):      Category (pN):  pN1a   ADDITIONAL FINDINGS   Additional Pathologic Findings:  prior biopsy site changes    Clinical Information Cancer of overlapping sites of left female breast    Gross Examination A. "Left axillary non-sentinel lymph node #1", received fresh for frozen section and placed in formalin at 10:10 on 01/16/19 is a  2.2 x 1.3 x 1.0 cm aggregate of yellow lobulated adipose tissue, with one lymph node candidate identified, measuring 0.6 x 0.5 x 0.3 cm and is submitted entirely as FS1.1; transferred to block A1.  B. "Left axillary non-sentinel lymph node #2, count 0", received fresh for frozen section and placed in formalin at 10:10 on 01/16/19 is a 1.8 x 0.4 x 0.8 cm fragment of tan-yellow lobulated adipose tissue, with no lymph node candidates grossly identified and the specimen is submitted entirely as FS2.1; transferred to block B1.  C. "Left axillary sentinel node #1, count 9", received fresh for frozen section and placed in formalin at 10:10 on 01/16/19 is a 2.6 x 1.6 x 1.2 cm aggregate of yellow lobulated adipose tissue, with one lymph node candidate identified, measuring 1.6 x 0.8 x 0.6 cm and is submitted entirely as FS3.1; transferred to block C1.   D. "Left mastectomy (short stitch - superior; long stitch - lateral)",  Collected at 10:02 on 01/16/19. Received fresh and placed in formalin at 12:30 on 01/16/19. Expected time out of formalin:  12:00 on 01/17/19 Procedure:  Simple mastectomy Specimen orientation:  Short stitch - superior; long stitch - lateral (per requisition) Specimen weight:  433 grams  Specimen dimensions: Medial to Lateral:  14.0 cm Anterior to Posterior:  4.7 cm Superior to Inferior:  15.3 cm  Skin dimensions:  10.2 x 3.2 cm Nipple diameter:  1.1 cm; Areola diameter:  2.9 cm Skin lesions:  There are three tan macules, measuring from 0.5 x 0.2 cm up to 0.8 x 0.3 cm on the skin surface.   Margins inked: Anterior:  Blue Posterior:  Black  Sectioned:  Medial to lateral in sagittal plane Gross findings:  Sectioning reveals an ill-defined, pale tan and firm heterogeneous mass.  Embedded within the mass are two biopsy microclips (ribbon-shaped and coil-shaped).   The remaining breast parenchyma is yellow and lobulated with a fat to fibrous ration of 9:1.  Gross tumor size:  7.0 x  5.6 x 3.0 cm  Location of the tumor:  11-2 o'clock Multifocal tumor:  No; per imaging, there are two hypoechoic masses; upon  gross inspection the mass appears to be one confluent lesion. Closest gross surgical margin:  Posterior  Distance to closest gross margin:  0.5 cm   Sectioned specimen radiographed?:  Yes Radiograph findings: Mass:  No Microcalcifications:  No Biopsy site microclip:  Yes, two  Axillary tail:  Absent; the upper outer quadrant is thoroughly sectioned and palpated and no lymph node candidates are identified.  Special studies prospectively ordered:  No  Representative tissue from this case was provided to the [place]?:  Yes; tumor and normal tissue  Representative sections are submitted as follows:  Block Summary: D1 - nipple and areola D2 - sections of skin macules D3 - parenchyma lateral to mass D4-12 - sections of mass submitted from lateral to medial    D5-9 - composite section of mass    D10 - ribbon microclip site    D11 - coil microclip site    D12 - mass with closest approach to posterior margin D13 - parenchyma medial to mass D14 - upper outer quadrant D15 - lower outer quadrant D16 - upper inner quadrant D17 - lower inner quadrant  E. "Left axillary lymph node dissection", received fresh and placed in formalin at 12:00 on 01/16/19 is a 4.8 x 4.5 x 1.5 cm aggregate of yellow lobulated adipose tissue, with three lymph node candidates identified, measuring 0.3 x 0.2 x 0.2 cm up to 2.0 x 1.0 x 0.7 cm and are submitted entirely as follows:  Block Summary: E1 - two lymph node candidates, bisected and differentially inked E2 - one lymph node candidate  C. Young/A. Deeny/Dr. Burt Ek   Intraoperative Consultation A. "Left axillary non-sentinel lymph node #1", FS1.1 (total) - negative for carcinoma  B. "Left axillary non-sentinel lymph node #2", FS2.1 (total) - negative for carcinoma  C. "Left axillary sentinel node  #1", FS3.1 (total) - positive for carcinoma (7 mm diameter)  All above diagnoses rendered by Dr. Blase Mess.   Microscopic Examination Microscopic examination is performed.   Disclaimer All immunohistochemical and in situ hybridization tests performed at [facility] and reported herein were developed, validated and their performance characteristics determined by the, [facility]. During the performance of these tests, appropriate positive and negative control slides are also performed and reviewed. All control slides and internal controls (when applicable) demonstrate the expected immunoreactive patterns and/or nucleic acid hybridization. Some of the tests may not be cleared or approved by the U.S. Food and Drug Administration (FDA). The FDA has determined that such clearance or approval is not necessary. These tests are used for clinical purposes and should not be regarded as investigational or as research. This laboratory is certified under the [place] (CLIA) as qualified to perform high complexity clinical testing.   Attestation All of the diagnostic evaluations on the enumerated specimens have been personally conducted by the pathologists involved in the care of this patient as indicated by the electronic signatures above.   Specimen Collected on  Tissue-Pathology - Structure of lymph node (body structure), Tissue specimen (specimen) - Lymph Node, Left Axillary, Tissue specimen (specimen) - Structure of lymph node (body structure), Tissue specimen (specimen) - Body tissue structure (body structure) 01/16/2019 8:50 AM       ELIGIBLE FOR AVAILABLE RESEARCH PROTOCOL: no  ASSESSMENT: 43 y.o. [place] woman status post left breast biopsy x2 on 12/08/2018, for multicentric invasive lobular breast cancer, grade 2, estrogen and progesterone receptor positive, HER-2 nonamplified, with an MIB-1 of 3%  (a)  bilateral breast MRI  12/20/2018 shows multiple masses throughout the left breast, with nipple retraction, but no evidence of chest wall invasion, no abnormal left axillary lymph nodes, and no findings of concern in the right breast  (b) chest CT scan and bone scan 12/29/2018 show only an incidental goiter  (1) status post left skin sparing mastectomy at [facility] 01/16/2019 for an mpT3 pN1, stage IIA invasive lobular carcinoma, grade 2  (a) a total of 8 lymph nodes removed, one with micrometastasis, 2 with micrometastasis  (2) adjuvant chemotherapy will consist of doxorubicin and cyclophosphamide in dose dense fashion x4 starting 03/13/2019, to be followed by weekly paclitaxel x12  (3) adjuvant radiation to follow  (4) antiestrogens to start at the completion of local treatment  (5) genetics testing 01/09/2019 through the [facility] STAT Breast Cancer Panel + Common Hereditary Cancers Panel found no deleterious mutations in ATM, BRCA1, BRCA2, CDH1, CHEK2, PALB2, PTEN, STK11 and TP53, APC, ATM, AXIN2, BARD1, BMPR1A, BRCA1, BRCA2, BRIP1, CDH1, CDKN2A (p14ARF), CDKN2A (p16INK4a), CKD4, CHEK2, CTNNA1, DICER1, EPCAM (Deletion/duplication testing only), GREM1 (promoter region deletion/duplication testing only), KIT, MEN1, MLH1, MSH2, MSH3, MSH6, MUTYH, NBN, NF1, NHTL1, PALB2, PDGFRA, PMS2, POLD1, POLE, PTEN, RAD50, RAD51C, RAD51D SDHB, SDHC, SDHD, SMAD4, SMARCA4. STK11, TP53, TSC1, TSC2, and VHL.  The following genes were evaluated for sequence changes only: SDHA and HOXB13 c.251G>A variant only.   PLAN: Abia did well with her surgery at [facility] and is very pleased with the treatment she received there.  She is a little disappointed that they could not immediately place an expander, but she will eventually have appropriate reconstruction under Dr.Phillips.  Immediately I have her of course is a week at the beach, which hopefully as she will enjoy.  When she returns she will have an echocardiogram and an echo and she will come and  meet with me on our chemotherapy teaching nurse to set her up for standard doxorubicin/cyclophosphamide followed by paclitaxel starting 03/13/2019.  Today we did discuss the mechanics as well as many of the possible side effects toxicities and complications of this protocol.  We discussed work issues and she probably will be able to work 4 days on the "off" quotes week and at least 1day namely Monday on the treatment week for the first 4 cycles.  When she is on paclitaxel of course things will get easier  We also discussed appropriate pandemic precautions since her children will be going to school.  She will be working from home.  I spent approximately 30 minutes face to face with Janiya with more than 50% of that time spent in counseling and coordination of care.    Chauncey Cruel, MD   02/21/2019 11:33 AM Medical Oncology and Hematology [facility] [address] [place], [place] [address] Tel. [phone]    Fax. [phone]   This document serves as a record of services personally performed by Lurline Del, MD. It was created on his behalf by Wilburn Mylar, a trained medical scribe. The creation of this record is based on the scribe's personal observations and the provider's statements to them.   I, Lurline Del MD, have reviewed the above documentation for accuracy and completeness, and I agree with the above.

## 2019-02-20 NOTE — Telephone Encounter (Signed)
Called patient regarding upcoming Webex appointment, patient is notified and e-mail has been sent. °

## 2019-02-21 ENCOUNTER — Inpatient Hospital Stay: Payer: 59 | Attending: Oncology | Admitting: Oncology

## 2019-02-21 DIAGNOSIS — C50812 Malignant neoplasm of overlapping sites of left female breast: Secondary | ICD-10-CM | POA: Diagnosis not present

## 2019-02-21 DIAGNOSIS — Z170 Estrogen receptor positive status [ER+]: Secondary | ICD-10-CM

## 2019-02-21 MED ORDER — LIDOCAINE-PRILOCAINE 2.5-2.5 % EX CREA: TOPICAL_CREAM | CUTANEOUS | 3 refills | Status: DC

## 2019-02-21 MED ORDER — PROCHLORPERAZINE MALEATE 10 MG PO TABS: 10.0000 mg | ORAL_TABLET | Freq: Four times a day (QID) | ORAL | 1 refills | Status: DC | PRN

## 2019-02-21 MED ORDER — DEXAMETHASONE 4 MG PO TABS: ORAL_TABLET | ORAL | 1 refills | Status: DC

## 2019-02-21 NOTE — Progress Notes (Signed)
START ON PATHWAY REGIMEN - Breast   Dose-Dense AC q14 days:   A cycle is every 14 days:     Doxorubicin      Cyclophosphamide      Pegfilgrastim-xxxx   **Always confirm dose/schedule in your pharmacy ordering system**  Paclitaxel 80 mg/m2 Weekly:   Administer weekly:     Paclitaxel   **Always confirm dose/schedule in your pharmacy ordering system**  Patient Characteristics: Postoperative without Neoadjuvant Therapy (Pathologic Staging), Invasive Disease, Adjuvant Therapy, HER2 Negative/Unknown/Equivocal, ER Positive, Node Positive, Node Positive (1-3), Genomic Testing Not Performed, Chemotherapy Candidate Therapeutic Status: Postoperative without Neoadjuvant Therapy (Pathologic Staging) AJCC Grade: GX AJCC N Category: pNX AJCC M Category: cM0 ER Status: Positive (+) AJCC 8 Stage Grouping: IIA HER2 Status: Negative (-) Oncotype Dx Recurrence Score: Not Appropriate AJCC T Category: pTX PR Status: Positive (+) Has this patient completed genomic testing<= No - Did Not Order Test  Intent of Therapy: Curative Intent, Discussed with Patient

## 2019-02-22 ENCOUNTER — Telehealth: Payer: Self-pay | Admitting: Oncology

## 2019-02-22 NOTE — Telephone Encounter (Signed)
I left a message regarding schedule  

## 2019-03-07 ENCOUNTER — Other Ambulatory Visit: Payer: Self-pay | Admitting: Radiology

## 2019-03-07 NOTE — Progress Notes (Signed)
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  She also had her Port-A-Cath placed today. She had no issues aside from an allergic reaction to the soap they used.  She is scheduled to start her chemotherapy 03/13/2019, consisting of cyclophosphamide and doxorubicin in dose dense fashion x4 to be followed by weekly paclitaxel x12.  She was supposed to undergo echocardiogram. She believed she did, but the report she showed me was for an EKG. I explained the difference, and we will obtain this as soon as possible.   REVIEW OF SYSTEMS: Emmali reports feeling well-informed about treatment. She attended chemotherapy education earlier today as well. She denies any pain to her breast. "I really feel back to normal," including her range of motion. She walks about 3.5 miles. She will be working from home. She states her kids go to a school where the kids attend in person only a couple days a week. She states her household is going to implement rules when she starts chemotherapy-- she won't go out, kids can't have friends over, her husband does the shopping. As far as her hair, she has a plan to shave it when the time comes and wear a wig. She tells me today that she is  friends with my patient Tonya Whitehead. A detailed review of systems was otherwise stable.   HISTORY OF CURRENT ILLNESS: From the original intake note:  Tonya Whitehead notes a history of reconstructive left breast surgery in 2002 while living in [place], [place]. That was for symmetry. The left breast accordingly had some scar tissue and she thought that was what she was noting until mid March, when she felt the left breast was changing more and the nipple seemed to be sinking subtly.  She brought this to Dr Dahlia Bailiff attention and underwent bilateral diagnostic mammography with tomography and bilateral breast ultrasonography at [facility] on 12/01/2018 showing: breast density category C; findings concerning for extensive malignancy throughout the left breast predominately involving the upper- and lower-outer quadrants.  The mass was palpable within the upper outer left breast and there was left nipple retraction as well as indentation along the inferior margin of the left breast.  Targeted ultrasound showed three masses measuring 5.6 cm (2 o'clock position), 4.0 cm (close to the nipple and contiguous to the prior mass), and 3.3 cm (6 o'clock position.).  Also noted was a borderline left axillary lymph node adjacent to the left axillary artery (and so not easy to biopsy).  In the right breast was noted a 1.3 cm cyst.  On 12/08/2018 [name] proceeded to biopsy of 2 of the left breast areas in question. The pathology from this procedure ([id number]) showed: invasive mammary carcinoma, grade 2, in two cores (at 2 o'clock and 6 o'clock) with identical histomorphology; e-cadherin is negative, consistent with a lobular phenotype. Prognostic indicators significant for: estrogen receptor, 70% positive  and progesterone receptor, 70% positive, both with strong staining intensity. Proliferation marker Ki67 at 3%. HER2 negative by immunohistochemistry (1+).  Note also the patient had a thyroid ultrasound 07/18/2018  showing bilateral solitary nodules, which were biopsied 08/09/2018.  Both showed follicular nodules, Bethesda category 2 (benign).  The patient's subsequent history is as detailed below.   PAST MEDICAL HISTORY: Past Medical History:  Diagnosis Date   Breast cancer ([place]) 12/08/2018   invasive lobular   Family history of bladder cancer    Family history of colon cancer    Family history of lung cancer    Family history of ovarian cancer     PAST SURGICAL HISTORY: Past Surgical History:  Procedure Laterality Date   ADENOIDECTOMY     as a child   BREAST EXCISIONAL BIOPSY Left 2002   reconstructive surgery on nipple to match right breast   IR IMAGING GUIDED PORT INSERTION  03/08/2019  Adenoidectomy as a child.   FAMILY HISTORY: Family History  Problem Relation Age of Onset   Lung cancer Mother 25       smoker   Ovarian cancer Mother 38   Colon cancer Maternal Aunt        65s   Bladder Cancer Paternal Grandfather        death was unrelated   Cancer Paternal Aunt        through [employer], blood cancer   As of May 2020 patient's father is alive at age 74. Patient's mother is also living at age 75. She has ovarian cancer, diagnosed in 2019, and lung cancer (stage IV), diagnosed in 2016. She tested negative for BRCA.  A maternal aunt had colon cancer in her 80s.  3 other siblings of the patient's mother have not had any cancer.  On the paternal side the paternal grandmother had bladder cancer.  A paternal great aunt died from cancer of the pancreas.     GYNECOLOGIC HISTORY:  Patient's last menstrual period was 02/07/2019. Menarche: 43 years old Age at first live birth: 43 years old GX P 3 LMP every month, 5 total days with 2 heavy Contraceptive no, husband with vasectomy. HRT n/a  Hysterectomy? no BSO? 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 Husband [name] is a physical therapist. He is biologically [place] and was  adopted by a [place] family.  The patient lives at home with her husband and their 3 children: Tonya Whitehead is 33, Tonya Whitehead is 53, and  Tonya Whitehead, 4.  The patient attends a [employer].    ADVANCED DIRECTIVES: Husband [name] is her HCPOA   HEALTH MAINTENANCE: Social History   Tobacco Use   Smoking status: Never Smoker   Smokeless tobacco: Never Used  Substance Use Topics   Alcohol use: Not on file   Drug use: Never     Colonoscopy: never done  PAP: not on file  Bone density: never done   Allergies  Allergen Reactions   Sulfa Antibiotics     Unknown reaction " I was told I had a reaction when I was a baby "    Current Outpatient Medications  Medication Sig Dispense Refill   dexamethasone (DECADRON) 4 MG tablet Take 2 tablets by mouth once a day on the day after chemotherapy and then take 2 tablets two times a day for 2 days. Take with food. 30 tablet 1   lidocaine-prilocaine (EMLA) cream Apply to affected area once 30 g 3   loratadine (  CLARITIN) 10 MG tablet Take 10 mg by mouth daily.     prochlorperazine (COMPAZINE) 10 MG tablet Take 1 tablet (10 mg total) by mouth every 6 (six) hours as needed (Nausea or vomiting). 30 tablet 1   No current facility-administered medications for this visit.    Facility-Administered Medications Ordered in Other Visits  Medication Dose Route Frequency Provider Last Rate Last Dose   0.9 %  sodium chloride infusion   Intravenous Continuous Monia Sabal, PA-C       fentaNYL (SUBLIMAZE) 100 MCG/2ML injection            heparin lock flush 100 UNIT/ML injection            lidocaine-EPINEPHrine (XYLOCAINE W/EPI) 1 %-1:100000 (with pres) injection            midazolam (VERSED) 2 MG/2ML injection             OBJECTIVE: Young white woman in no acute distress  Vitals:   03/08/19 1600  BP: 110/74  Pulse: 71  Resp: 18  Temp: 98.1 F (36.7 C)  SpO2: 100%     Body mass index is 24 kg/m.   Wt Readings from Last 3 Encounters:    03/08/19 139 lb 12.8 oz (63.4 kg)  03/08/19 139 lb 6.4 oz (63.2 kg)  12/13/18 143 lb 11.2 oz (65.2 kg)      ECOG FS:1 - Symptomatic but completely ambulatory   Sclerae unicteric, EOMs intact Oropharynx clear and moist No cervical or supraclavicular adenopathy Lungs no rales or rhonchi Heart regular rate and rhythm Abd soft, nontender, positive bowel sounds MSK no focal spinal tenderness, no upper extremity lymphedema Neuro: nonfocal, well oriented, appropriate affect Breasts: The right breast is unremarkable.  The left breast is status post mastectomy.  The incision is healing nicely. Skin: The port is placed in the right upper anterior chest.  Bandage is intact and dry.  LAB RESULTS:  CMP     Component Value Date/Time   NA 138 03/08/2019 1240   K 4.0 03/08/2019 1240   CL 105 03/08/2019 1240   CO2 24 03/08/2019 1240   GLUCOSE 105 (H) 03/08/2019 1240   BUN 9 03/08/2019 1240   CREATININE 0.71 03/08/2019 1240   CALCIUM 9.5 03/08/2019 1240   GFRNONAA >60 03/08/2019 1240   GFRAA >60 03/08/2019 1240    No results found for: TOTALPROTELP, ALBUMINELP, A1GS, A2GS, BETS, BETA2SER, GAMS, MSPIKE, SPEI  No results found for: Nils Pyle, [facility]  Lab Results  Component Value Date   WBC 7.4 03/08/2019   HGB 13.9 03/08/2019   HCT 43.1 03/08/2019   MCV 94.1 03/08/2019   PLT 255 03/08/2019    '@LASTCHEMISTRY' @  No results found for: LABCA2  No components found for: [id number]  Recent Labs  Lab 03/08/19 1240  INR 0.9    No results found for: LABCA2  No results found for: GQQ761  No results found for: PJK932  No results found for: IZT245  No results found for: CA2729  No components found for: HGQUANT  No results found for: CEA1 / No results found for: CEA1   No results found for: AFPTUMOR  No results found for: [place]  No results found for: [facility] Outpatient Visit on 03/08/2019  Component Date Value Ref Range Status   Sodium  03/08/2019 138  135 - 145 mmol/L Final   Potassium 03/08/2019 4.0  3.5 - 5.1 mmol/L Final   Chloride 03/08/2019 105  98 - 111 mmol/L Final  CO2 03/08/2019 24  22 - 32 mmol/L Final   Glucose, Bld 03/08/2019 105* 70 - 99 mg/dL Final   BUN 03/08/2019 9  6 - 20 mg/dL Final   Creatinine, Ser 03/08/2019 0.71  0.44 - 1.00 mg/dL Final   Calcium 03/08/2019 9.5  8.9 - 10.3 mg/dL Final   GFR calc non Af Amer 03/08/2019 >60  >60 mL/min Final   GFR calc Af Amer 03/08/2019 >60  >60 mL/min Final   Anion gap 03/08/2019 9  5 - 15 Final   Performed at [facility], [place] [address], [place], [place] [address]   WBC 03/08/2019 7.4  4.0 - 10.5 K/uL Final   RBC 03/08/2019 4.58  3.87 - 5.11 MIL/uL Final   Hemoglobin 03/08/2019 13.9  12.0 - 15.0 g/dL Final   HCT 03/08/2019 43.1  36.0 - 46.0 % Final   MCV 03/08/2019 94.1  80.0 - 100.0 fL Final   MCH 03/08/2019 30.3  26.0 - 34.0 pg Final   MCHC 03/08/2019 32.3  30.0 - 36.0 g/dL Final   RDW 03/08/2019 13.2  11.5 - 15.5 % Final   Platelets 03/08/2019 255  150 - 400 K/uL Final   nRBC 03/08/2019 0.0  0.0 - 0.2 % Final   Performed at [facility], [place] [address], [place], [place] [address]   Prothrombin Time 03/08/2019 12.2  11.4 - 15.2 seconds Final   INR 03/08/2019 0.9  0.8 - 1.2 Final   Comment: (NOTE) INR goal varies based on device and disease states. Performed at [facility], [place] [address] [place], [place] [address]     (this displays the last labs from the last 3 days)  No results found for: TOTALPROTELP, ALBUMINELP, A1GS, A2GS, BETS, BETA2SER, GAMS, MSPIKE, SPEI (this displays SPEP labs)  No results found for: KPAFRELGTCHN, LAMBDASER, KAPLAMBRATIO (kappa/lambda light chains)  No results found for: HGBA, HGBA2QUANT, HGBFQUANT, HGBSQUAN (Hemoglobinopathy evaluation)   No results found for: LDH  No results found for: IRON, TIBC, IRONPCTSAT (Iron and TIBC)  No  results found for: FERRITIN  Urinalysis No results found for: COLORURINE, APPEARANCEUR, LABSPEC, PHURINE, GLUCOSEU, HGBUR, BILIRUBINUR, KETONESUR, PROTEINUR, UROBILINOGEN, NITRITE, LEUKOCYTESUR   STUDIES: Ir Imaging Guided Port Insertion  Result Date: 03/08/2019 INDICATION: 43 year old female with left-sided breast cancer. She presents for port catheter placement. EXAM: IMPLANTED PORT A CATH PLACEMENT WITH ULTRASOUND AND FLUOROSCOPIC GUIDANCE MEDICATIONS: 2 g Ancef; The antibiotic was administered within an appropriate time interval prior to skin puncture. ANESTHESIA/SEDATION: Versed 4 mg IV; Fentanyl 100 mcg IV; Moderate Sedation Time:  23 minutes The patient was continuously monitored during the procedure by the interventional radiology nurse under my direct supervision. FLUOROSCOPY TIME:  0 minutes, 18 seconds (2 mGy) COMPLICATIONS: None immediate. PROCEDURE: The right neck and chest was prepped with chlorhexidine, and draped in the usual sterile fashion using maximum barrier technique (cap and mask, sterile gown, sterile gloves, large sterile sheet, hand hygiene and cutaneous antiseptic). Local anesthesia was attained by infiltration with 1% lidocaine with epinephrine. Ultrasound demonstrated patency of the right internal jugular vein, and this was documented with an image. Under real-time ultrasound guidance, this vein was accessed with a 21 gauge micropuncture needle and image documentation was performed. A small dermatotomy was made at the access site with an 11 scalpel. A 0.018" wire was advanced into the SVC and the access needle exchanged for a 60F micropuncture vascular sheath. The 0.018" wire was then removed and a 0.035" wire advanced into the IVC.  An appropriate location for the subcutaneous reservoir was selected below the clavicle and an incision was made through the skin and underlying soft tissues. The subcutaneous tissues were then dissected using a combination of blunt and sharp surgical  technique and a pocket was formed. A single lumen power injectable portacatheter was then tunneled through the subcutaneous tissues from the pocket to the dermatotomy and the port reservoir placed within the subcutaneous pocket. The venous access site was then serially dilated and a peel away vascular sheath placed over the wire. The wire was removed and the port catheter advanced into position under fluoroscopic guidance. The catheter tip is positioned in the superior cavoatrial junction. This was documented with a spot image. The portacatheter was then tested and found to flush and aspirate well. The port was flushed with saline followed by 100 units/mL heparinized saline. The pocket was then closed in two layers using first subdermal inverted interrupted absorbable sutures followed by a running subcuticular suture. The epidermis was then sealed with Dermabond. The dermatotomy at the venous access site was also closed with Dermabond. IMPRESSION: Successful placement of a right IJ approach Power Port with ultrasound and fluoroscopic guidance. The catheter is ready for use. Electronically Signed   By: Jacqulynn Cadet M.D.   On: 03/08/2019 14:56     ELIGIBLE FOR AVAILABLE RESEARCH PROTOCOL: no  ASSESSMENT: 43 y.o. [place] woman status post left breast biopsy x2 on 12/08/2018, for multicentric invasive lobular breast cancer, grade 2, estrogen and progesterone receptor positive, HER-2 nonamplified, with an MIB-1 of 3%  (a) bilateral breast MRI 12/20/2018 shows multiple masses throughout the left breast, with nipple retraction, but no evidence of chest wall invasion, no abnormal left axillary lymph nodes, and no findings of concern in the right breast  (b) chest CT scan and bone scan 12/29/2018 show only an incidental goiter  (1) status post left skin sparing mastectomy at [facility] 01/16/2019 for an mpT3 pN1, stage IIA invasive lobular carcinoma, grade 2; repeat prognostic panel again estrogen and  progesterone receptor strongly positive, HER-2 nonamplified.  (a) a total of 8 lymph nodes removed, one with macrometastasis (8 mm, no ECE)  (2) adjuvant chemotherapy will consist of doxorubicin and cyclophosphamide in dose dense fashion x4 starting 03/13/2019, to be followed by weekly paclitaxel x12  (a) echo  (3) adjuvant radiation to follow  (4) antiestrogens to start at the completion of local treatment  (5) genetics testing 01/09/2019 through the [facility] STAT Breast Cancer Panel + Common Hereditary Cancers Panel found no deleterious mutations in ATM, BRCA1, BRCA2, CDH1, CHEK2, PALB2, PTEN, STK11 and TP53, APC, ATM, AXIN2, BARD1, BMPR1A, BRCA1, BRCA2, BRIP1, CDH1, CDKN2A (p14ARF), CDKN2A (p16INK4a), CKD4, CHEK2, CTNNA1, DICER1, EPCAM (Deletion/duplication testing only), GREM1 (promoter region deletion/duplication testing only), KIT, MEN1, MLH1, MSH2, MSH3, MSH6, MUTYH, NBN, NF1, NHTL1, PALB2, PDGFRA, PMS2, POLD1, POLE, PTEN, RAD50, RAD51C, RAD51D SDHB, SDHC, SDHD, SMAD4, SMARCA4. STK11, TP53, TSC1, TSC2, and VHL.  The following genes were evaluated for sequence changes only: SDHA and HOXB13 c.251G>A variant only.   PLAN: Laxmi thought she had an echo at [facility] but actually what she had was an electrocardiogram.  Unfortunately her echo today was canceled as a result.  We will try to reschedule it for tomorrow or Monday so that she can proceed to chemotherapy on Tuesday as planned.  She met with our chemotherapy teaching nurse and got a very good overview of the chemo as well as a tour of the chemo treatment area.  I also discussed with  her some possible side effects from the supportive meds including constipation from palonosetron and jitteriness or sleepiness from Compazine.  I let her know that she may become very fatigued on days 456 when she stops the dexamethasone and in general asked her to keep a diary of symptoms and call us with any problems.  I suggested if she develops pain secondary  to the port insertion she should take ibuprofen 200 mg together with Tylenol 500 mg up to 3 times a day.  She is also developing a little bit of a rash from the iodine used in the procedure and she will use Cortaid cream for that  She knows to call for any issue that may develop before her next visit here which will be 1 week after her first chemo cycle  Chauncey Cruel, MD   03/08/2019 4:35 PM Medical Oncology and Hematology [facility] [place], [place] [address] Tel. [phone]    Fax. [phone]   This document serves as a record of services personally performed by Lurline Del, MD. It was created on his behalf by Wilburn Mylar, a trained medical scribe. The creation of this record is based on the scribe's personal observations and the provider's statements to them.   I, Lurline Del MD, have reviewed the above documentation for accuracy and completeness, and I agree with the above.

## 2019-03-08 ENCOUNTER — Encounter (HOSPITAL_COMMUNITY): Payer: Self-pay

## 2019-03-08 ENCOUNTER — Ambulatory Visit (HOSPITAL_COMMUNITY)
Admission: RE | Admit: 2019-03-08 | Discharge: 2019-03-08 | Disposition: A | Discharge status: Home / Self Care | Payer: 59 | Source: Ambulatory Visit | Attending: Oncology | Admitting: Oncology

## 2019-03-08 ENCOUNTER — Inpatient Hospital Stay: Payer: 59 | Attending: Oncology | Admitting: Oncology

## 2019-03-08 ENCOUNTER — Inpatient Hospital Stay: Payer: 59

## 2019-03-08 ENCOUNTER — Ambulatory Visit (HOSPITAL_COMMUNITY): Admission: RE | Admit: 2019-03-08 | Payer: 59 | Source: Ambulatory Visit

## 2019-03-08 ENCOUNTER — Other Ambulatory Visit: Payer: Self-pay

## 2019-03-08 ENCOUNTER — Encounter: Payer: Self-pay | Admitting: Oncology

## 2019-03-08 ENCOUNTER — Other Ambulatory Visit: Payer: Self-pay | Admitting: Oncology

## 2019-03-08 VITALS — BP 110/74 | HR 71 | Temp 98.1°F | Resp 18 | Ht 64.0 in | Wt 139.8 lb

## 2019-03-08 DIAGNOSIS — E860 Dehydration: Secondary | ICD-10-CM | POA: Insufficient documentation

## 2019-03-08 DIAGNOSIS — C50812 Malignant neoplasm of overlapping sites of left female breast: Secondary | ICD-10-CM | POA: Insufficient documentation

## 2019-03-08 DIAGNOSIS — D701 Agranulocytosis secondary to cancer chemotherapy: Secondary | ICD-10-CM | POA: Insufficient documentation

## 2019-03-08 DIAGNOSIS — Z170 Estrogen receptor positive status [ER+]: Secondary | ICD-10-CM

## 2019-03-08 DIAGNOSIS — Z808 Family history of malignant neoplasm of other organs or systems: Secondary | ICD-10-CM | POA: Diagnosis not present

## 2019-03-08 DIAGNOSIS — T451X5A Adverse effect of antineoplastic and immunosuppressive drugs, initial encounter: Secondary | ICD-10-CM | POA: Insufficient documentation

## 2019-03-08 DIAGNOSIS — Z5111 Encounter for antineoplastic chemotherapy: Secondary | ICD-10-CM | POA: Insufficient documentation

## 2019-03-08 DIAGNOSIS — Z9012 Acquired absence of left breast and nipple: Secondary | ICD-10-CM | POA: Insufficient documentation

## 2019-03-08 DIAGNOSIS — Z8052 Family history of malignant neoplasm of bladder: Secondary | ICD-10-CM | POA: Insufficient documentation

## 2019-03-08 DIAGNOSIS — Z800 Family history of malignant neoplasm of digestive organs: Secondary | ICD-10-CM | POA: Insufficient documentation

## 2019-03-08 DIAGNOSIS — Z801 Family history of malignant neoplasm of trachea, bronchus and lung: Secondary | ICD-10-CM | POA: Insufficient documentation

## 2019-03-08 DIAGNOSIS — Z7689 Persons encountering health services in other specified circumstances: Secondary | ICD-10-CM | POA: Insufficient documentation

## 2019-03-08 DIAGNOSIS — Z79899 Other long term (current) drug therapy: Secondary | ICD-10-CM | POA: Insufficient documentation

## 2019-03-08 DIAGNOSIS — Z882 Allergy status to sulfonamides status: Secondary | ICD-10-CM | POA: Insufficient documentation

## 2019-03-08 DIAGNOSIS — Z8041 Family history of malignant neoplasm of ovary: Secondary | ICD-10-CM | POA: Insufficient documentation

## 2019-03-08 DIAGNOSIS — R112 Nausea with vomiting, unspecified: Secondary | ICD-10-CM | POA: Insufficient documentation

## 2019-03-08 HISTORY — PX: IR IMAGING GUIDED PORT INSERTION: IMG5740

## 2019-03-08 LAB — BASIC METABOLIC PANEL
Anion gap: 9 (ref 5–15)
BUN: 9 mg/dL (ref 6–20)
CO2: 24 mmol/L (ref 22–32)
Calcium: 9.5 mg/dL (ref 8.9–10.3)
Chloride: 105 mmol/L (ref 98–111)
Creatinine, Ser: 0.71 mg/dL (ref 0.44–1.00)
GFR calc Af Amer: >60 mL/min (ref >60)
GFR calc non Af Amer: >60 mL/min (ref >60)
Glucose, Bld: 105 mg/dL — ABNORMAL HIGH (ref 70–99)
Potassium: 4 mmol/L (ref 3.5–5.1)
Sodium: 138 mmol/L (ref 135–145)

## 2019-03-08 LAB — CBC
HCT: 43.1 % (ref 36.0–46.0)
Hemoglobin: 13.9 g/dL (ref 12.0–15.0)
MCH: 30.3 pg (ref 26.0–34.0)
MCHC: 32.3 g/dL (ref 30.0–36.0)
MCV: 94.1 fL (ref 80.0–100.0)
Platelets: 255 10*3/uL (ref 150–400)
RBC: 4.58 MIL/uL (ref 3.87–5.11)
RDW: 13.2 % (ref 11.5–15.5)
WBC: 7.4 10*3/uL (ref 4.0–10.5)
nRBC: 0 % (ref 0.0–0.2)

## 2019-03-08 LAB — PROTIME-INR
INR: 0.9 (ref 0.8–1.2)
Prothrombin Time: 12.2 seconds (ref 11.4–15.2)

## 2019-03-08 MED ORDER — LIDOCAINE-EPINEPHRINE (PF) 1 %-1:200000 IJ SOLN
INTRAMUSCULAR | Status: AC | PRN
  Administered 2019-03-08: 10 mL

## 2019-03-08 MED ORDER — FENTANYL CITRATE (PF) 100 MCG/2ML IJ SOLN
INTRAMUSCULAR | Status: AC
  Filled 2019-03-08: qty 2

## 2019-03-08 MED ORDER — LIDOCAINE-EPINEPHRINE 1 %-1:100000 IJ SOLN
INTRAMUSCULAR | Status: AC
  Filled 2019-03-08: qty 1

## 2019-03-08 MED ORDER — MIDAZOLAM HCL 2 MG/2ML IJ SOLN
INTRAMUSCULAR | Status: AC | PRN
  Administered 2019-03-08 (×4): 1 mg via INTRAVENOUS

## 2019-03-08 MED ORDER — CEFAZOLIN SODIUM-DEXTROSE 2-4 GM/100ML-% IV SOLN
INTRAVENOUS | Status: AC
  Administered 2019-03-08: 2 g via INTRAVENOUS
  Filled 2019-03-08: qty 100

## 2019-03-08 MED ORDER — HEPARIN SOD (PORK) LOCK FLUSH 100 UNIT/ML IV SOLN
INTRAVENOUS | Status: AC
  Filled 2019-03-08: qty 5

## 2019-03-08 MED ORDER — HEPARIN SOD (PORK) LOCK FLUSH 100 UNIT/ML IV SOLN
INTRAVENOUS | Status: AC | PRN
  Administered 2019-03-08: 500 [IU] via INTRAVENOUS

## 2019-03-08 MED ORDER — CEFAZOLIN SODIUM-DEXTROSE 2-4 GM/100ML-% IV SOLN
2.0000 g | INTRAVENOUS | Status: AC
  Administered 2019-03-08: 2 g via INTRAVENOUS

## 2019-03-08 MED ORDER — MIDAZOLAM HCL 2 MG/2ML IJ SOLN
INTRAMUSCULAR | Status: AC
  Filled 2019-03-08: qty 4

## 2019-03-08 MED ORDER — FENTANYL CITRATE (PF) 100 MCG/2ML IJ SOLN
INTRAMUSCULAR | Status: AC | PRN
  Administered 2019-03-08 (×2): 50 ug via INTRAVENOUS

## 2019-03-08 MED ORDER — SODIUM CHLORIDE 0.9 % IV SOLN: INTRAVENOUS | Status: DC

## 2019-03-08 MED ORDER — LIDOCAINE-EPINEPHRINE (PF) 1 %-1:200000 IJ SOLN
INTRAMUSCULAR | Status: AC | PRN
  Administered 2019-03-08: 5 mL

## 2019-03-08 NOTE — H&P (Signed)
Chief Complaint: Breast Cancer  Referring Physician(s): Chauncey Cruel  Supervising Physician: Jacqulynn Cadet  Patient Status: [facility] - Out-pt  History of Present Illness: Tonya Whitehead is a 43 y.o. female with new diagnosis of left breast cancer. She has undergone skin sparing mastectomy at [facility].  She is here today for placement of a Port A Cath.  She will also have radiation therapy.  She is NPO. No blood thinners. No nausea/vomiting. No Fever/chills. ROS negative.   Past Medical History:  Diagnosis Date  . 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 Family history of bladder cancer   . Family history of colon cancer   . Family history of lung cancer   . Family history of ovarian cancer     Past Surgical History:  Procedure Laterality Date  . ADENOIDECTOMY     as a child  . BREAST EXCISIONAL BIOPSY Left 2002   reconstructive surgery on nipple to match right breast    Allergies: Sulfa antibiotics  Medications: Prior to Admission medications   Medication Sig Start Date End Date Taking? Authorizing Provider  loratadine (CLARITIN) 10 MG tablet Take 10 mg by mouth daily.   Yes [provider]  dexamethasone (DECADRON) 4 MG tablet Take 2 tablets by mouth once a day on the day after chemotherapy and then take 2 tablets two times a day for 2 days. Take with food. 02/21/19   Magrinat, Virgie Dad, MD  lidocaine-prilocaine (EMLA) cream Apply to affected area once 02/21/19   Magrinat, Virgie Dad, MD  prochlorperazine (COMPAZINE) 10 MG tablet Take 1 tablet (10 mg total) by mouth every 6 (six) hours as needed (Nausea or vomiting). 02/21/19   Magrinat, Virgie Dad, MD     Family History  Problem Relation Age of Onset  . Lung cancer Mother 80       smoker  . Ovarian cancer Mother 57  . Colon cancer Maternal Aunt        84s  . Bladder Cancer Paternal Grandfather        death was unrelated  . Cancer Paternal Aunt        through Olivia Lopez de Gutierrez, blood cancer     Social History   Socioeconomic History  . Marital status: Married    Spouse name: [name]  . Number of children: 3  . Years of education: Not on file  . Highest education level: Not on file  Occupational History  . Not on file  Social Needs  . Financial resource strain: Not on file  . Food insecurity    Worry: Not on file    Inability: Not on file  . Transportation needs    Medical: Not on file    Non-medical: Not on file  Tobacco Use  . Smoking status: Never Smoker  . Smokeless tobacco: Never Used  Substance and Sexual Activity  . Alcohol use: Not on file  . Drug use: Never  . Sexual activity: Yes    Birth control/protection: Other-see comments    Comment: husband had vasectomy  Lifestyle  . Physical activity    Days per week: Not on file    Minutes per session: Not on file  . Stress: Not on file  Relationships  . Social Herbalist on phone: Not on file    Gets together: Not on file    Attends religious service: Not on file    Active member of club or organization: Not on file  Attends meetings of clubs or organizations: Not on file    Relationship status: Not on file  Other Topics Concern  . Not on file  Social History Narrative  . Not on file     Review of Systems: A 12 point ROS discussed and pertinent positives are indicated in the HPI above.  All other systems are negative.  Review of Systems  Vital Signs: LMP 02/07/2019   Physical Exam Vitals signs reviewed.  Constitutional:      Appearance: Normal appearance.  HENT:     Head: Normocephalic and atraumatic.  Eyes:     Extraocular Movements: Extraocular movements intact.  Neck:     Musculoskeletal: Normal range of motion.  Cardiovascular:     Rate and Rhythm: Normal rate and regular rhythm.  Pulmonary:     Effort: Pulmonary effort is normal.     Breath sounds: Normal breath sounds.  Abdominal:     General: There is no distension.     Palpations: Abdomen is soft.  Musculoskeletal:  Normal range of motion.  Skin:    General: Skin is warm.  Neurological:     General: No focal deficit present.     Mental Status: She is alert and oriented to person, place, and time.  Psychiatric:        Mood and Affect: Mood normal.        Behavior: Behavior normal.        Thought Content: Thought content normal.        Judgment: Judgment normal.     Imaging: No results found.  Labs:  CBC: No results for input(s): WBC, HGB, HCT, PLT in the last 8760 hours.  COAGS: No results for input(s): INR, APTT in the last 8760 hours.  BMP: No results for input(s): NA, K, CL, CO2, GLUCOSE, BUN, CALCIUM, CREATININE, GFRNONAA, GFRAA in the last 8760 hours.  Invalid input(s): CMP  LIVER FUNCTION TESTS: No results for input(s): BILITOT, AST, ALT, ALKPHOS, PROT, ALBUMIN in the last 8760 hours.  TUMOR MARKERS: No results for input(s): AFPTM, CEA, CA199, CHROMGRNA in the last 8760 hours.  Assessment and Plan:  Left breast cancer - s/p skin sparing mastectomy.  Will proceed with placement of a tunneled catheter with Port today by Dr. Laurence Ferrari.  Risks and benefits of image guided port-a-catheter placement was discussed with the patient including, but not limited to bleeding, infection, pneumothorax, or fibrin sheath development and need for additional procedures.  All of the patient's questions were answered, patient is agreeable to proceed. Consent signed and in chart.  Thank you for this interesting consult.  I greatly enjoyed meeting Tonya Whitehead and look forward to participating in their care.  A copy of this report was sent to the requesting provider on this date.  Electronically Signed: Murrell Redden, PA-C   03/08/2019, 12:38 PM      I spent a total of  30 Minutes   in face to face in clinical consultation, greater than 50% of which was counseling/coordinating care for [facility] A Cath.

## 2019-03-08 NOTE — Procedures (Signed)
Interventional Radiology Procedure Note  Procedure: Placement of a right IJ approach single lumen PowerPort.  Tip is positioned at the superior cavoatrial junction and catheter is ready for immediate use.  Complications: No immediate Recommendations:  - Ok to shower tomorrow - Do not submerge for 7 days - Routine line care   Signed,  Heath K. McCullough, MD   

## 2019-03-08 NOTE — Progress Notes (Signed)
Met with patient at registration to introduce myself as Arboriculturist and to offer available resources.  Discussed one-time $1000 Radio broadcast assistant to assist with personal expenses while going through treatment.  Gave her my card if interested in applying and for any additional financial questions or concerns. She verbalized understanding.

## 2019-03-08 NOTE — Discharge Instructions (Signed)
Moderate Conscious Sedation, Adult, Care After These instructions provide you with information about caring for yourself after your procedure. Your health care provider may also give you more specific instructions. Your treatment has been planned according to current medical practices, but problems sometimes occur. Call your health care provider if you have any problems or questions after your procedure. What can I expect after the procedure? After your procedure, it is common:  To feel sleepy for several hours.  To feel clumsy and have poor balance for several hours.  To have poor judgment for several hours.  To vomit if you eat too soon. Follow these instructions at home: For at least 24 hours after the procedure:   Do not: ? Participate in activities where you could fall or become injured. ? Drive. ? Use heavy machinery. ? Drink alcohol. ? Take sleeping pills or medicines that cause drowsiness. ? Make important decisions or sign legal documents. ? Take care of children on your own.  Rest. Eating and drinking  Follow the diet recommended by your health care provider.  If you vomit: ? Drink water, juice, or soup when you can drink without vomiting. ? Make sure you have little or no nausea before eating solid foods. General instructions  Have a responsible adult stay with you until you are awake and alert.  Take over-the-counter and prescription medicines only as told by your health care provider.  If you smoke, do not smoke without supervision.  Keep all follow-up visits as told by your health care provider. This is important. Contact a health care provider if:  You keep feeling nauseous or you keep vomiting.  You feel light-headed.  You develop a rash.  You have a fever. Get help right away if:  You have trouble breathing. This information is not intended to replace advice given to you by your health care provider. Make sure you discuss any questions you have  with your health care provider. Document Released: 05/02/2013 Document Revised: 06/24/2017 Document Reviewed: 11/01/2015 Elsevier Patient Education  [place] Insertion, Care After This sheet gives you information about how to care for yourself after your procedure. Your health care provider may also give you more specific instructions. If you have problems or questions, contact your health care provider. What can I expect after the procedure? After the procedure, it is common to have:  Discomfort at the port insertion site.  Bruising on the skin over the port. This should improve over 3-4 days. Follow these instructions at home: [facility] care  After your port is placed, you will get a manufacturer's information card. The card has information about your port. Keep this card with you at all times.  Take care of the port as told by your health care provider. Ask your health care provider if you or a family member can get training for taking care of the port at home. A home health care nurse may also take care of the port.  Make sure to remember what type of port you have. Incision care      Follow instructions from your health care provider about how to take care of your port insertion site. Make sure you: ? Wash your hands with soap and water before and after you change your bandage (dressing). If soap and water are not available, use hand sanitizer. ? Change your dressing as told by your health care provider.  You may remove your dressing tomorrow. ? Leave stitches (sutures), skin  glue, or adhesive strips in place. These skin closures may need to stay in place for 2 weeks or longer. If adhesive strip edges start to loosen and curl up, you may trim the loose edges. Do not remove adhesive strips completely unless your health care provider tells you to do that.  DO NOT use EMLA cream for 2 weeks after port placement as this cream will remove surgical glue on your  incision as this cream will remove surgical glue on your incision.  Check your port insertion site every day for signs of infection. Check for: ? Redness, swelling, or pain. ? Fluid or blood. ? Warmth. ? Pus or a bad smell. Activity  Return to your normal activities as told by your health care provider. Ask your health care provider what activities are safe for you.  Do not lift anything that is heavier than 10 lb (4.5 kg), or the limit that you are told, until your health care provider says that it is safe. General instructions  Take over-the-counter and prescription medicines only as told by your health care provider.  Do not take baths, swim, or use a hot tub until your health care provider approves. Ask your health care provider if you may take showers. You may only be allowed to take sponge baths.  You may shower tomorrow.  Do not drive for 24 hours if you were given a sedative during your procedure.  Wear a medical alert bracelet in case of an emergency. This will tell any health care providers that you have a port.  Keep all follow-up visits as told by your health care provider. This is important. Contact a health care provider if:  You cannot flush your port with saline as directed, or you cannot draw blood from the port.  You have a fever or chills.  You have redness, swelling, or pain around your port insertion site.  You have fluid or blood coming from your port insertion site.  Your port insertion site feels warm to the touch.  You have pus or a bad smell coming from the port insertion site. Get help right away if:  You have chest pain or shortness of breath.  You have bleeding from your port that you cannot control. Summary  Take care of the port as told by your health care provider. Keep the manufacturer's information card with you at all times.  Change your dressing as told by your health care provider.  Contact a health care provider if you have a fever  or chills or if you have redness, swelling, or pain around your port insertion site.  Keep all follow-up visits as told by your health care provider. This information is not intended to replace advice given to you by your health care provider. Make sure you discuss any questions you have with your health care provider. Document Released: 05/02/2013 Document Revised: 02/07/2018 Document Reviewed: 02/07/2018 Elsevier Patient Education  [place].

## 2019-03-09 ENCOUNTER — Ambulatory Visit (HOSPITAL_COMMUNITY)
Admission: RE | Admit: 2019-03-09 | Discharge: 2019-03-09 | Disposition: A | Discharge status: Home / Self Care | Payer: 59 | Source: Ambulatory Visit | Attending: Oncology | Admitting: Oncology

## 2019-03-09 ENCOUNTER — Telehealth: Payer: Self-pay | Admitting: Oncology

## 2019-03-09 DIAGNOSIS — I341 Nonrheumatic mitral (valve) prolapse: Secondary | ICD-10-CM | POA: Diagnosis not present

## 2019-03-09 DIAGNOSIS — C50812 Malignant neoplasm of overlapping sites of left female breast: Secondary | ICD-10-CM | POA: Insufficient documentation

## 2019-03-09 DIAGNOSIS — Z170 Estrogen receptor positive status [ER+]: Secondary | ICD-10-CM | POA: Insufficient documentation

## 2019-03-09 NOTE — Progress Notes (Signed)
  Echocardiogram 2D Echocardiogram has been performed.  Tonya Whitehead 03/09/2019, 11:29 AM

## 2019-03-09 NOTE — Telephone Encounter (Signed)
I left a message regarding 8/26

## 2019-03-13 ENCOUNTER — Inpatient Hospital Stay: Payer: 59

## 2019-03-13 ENCOUNTER — Other Ambulatory Visit: Payer: Self-pay | Admitting: Oncology

## 2019-03-13 ENCOUNTER — Other Ambulatory Visit: Payer: Self-pay

## 2019-03-13 ENCOUNTER — Telehealth: Payer: Self-pay | Admitting: *Deleted

## 2019-03-13 VITALS — BP 118/80 | HR 64 | Temp 99.1°F | Resp 18

## 2019-03-13 DIAGNOSIS — Z9012 Acquired absence of left breast and nipple: Secondary | ICD-10-CM | POA: Diagnosis not present

## 2019-03-13 DIAGNOSIS — C50812 Malignant neoplasm of overlapping sites of left female breast: Secondary | ICD-10-CM

## 2019-03-13 DIAGNOSIS — Z7689 Persons encountering health services in other specified circumstances: Secondary | ICD-10-CM | POA: Diagnosis not present

## 2019-03-13 DIAGNOSIS — Z5111 Encounter for antineoplastic chemotherapy: Secondary | ICD-10-CM | POA: Diagnosis present

## 2019-03-13 DIAGNOSIS — Z79899 Other long term (current) drug therapy: Secondary | ICD-10-CM | POA: Diagnosis not present

## 2019-03-13 DIAGNOSIS — Z170 Estrogen receptor positive status [ER+]: Secondary | ICD-10-CM

## 2019-03-13 DIAGNOSIS — T451X5A Adverse effect of antineoplastic and immunosuppressive drugs, initial encounter: Secondary | ICD-10-CM | POA: Diagnosis not present

## 2019-03-13 DIAGNOSIS — D701 Agranulocytosis secondary to cancer chemotherapy: Secondary | ICD-10-CM | POA: Diagnosis not present

## 2019-03-13 DIAGNOSIS — Z95828 Presence of other vascular implants and grafts: Secondary | ICD-10-CM

## 2019-03-13 DIAGNOSIS — E860 Dehydration: Secondary | ICD-10-CM | POA: Diagnosis not present

## 2019-03-13 DIAGNOSIS — R112 Nausea with vomiting, unspecified: Secondary | ICD-10-CM | POA: Diagnosis not present

## 2019-03-13 LAB — CBC WITH DIFFERENTIAL/PLATELET
Abs Immature Granulocytes: 0.02 10*3/uL (ref 0.00–0.07)
Basophils Absolute: 0 10*3/uL (ref 0.0–0.1)
Basophils Relative: 1 %
Eosinophils Absolute: 0.2 10*3/uL (ref 0.0–0.5)
Eosinophils Relative: 3 %
HCT: 38.7 % (ref 36.0–46.0)
Hemoglobin: 12.6 g/dL (ref 12.0–15.0)
Immature Granulocytes: 0 %
Lymphocytes Relative: 18 %
Lymphs Abs: 1 10*3/uL (ref 0.7–4.0)
MCH: 29.9 pg (ref 26.0–34.0)
MCHC: 32.6 g/dL (ref 30.0–36.0)
MCV: 91.9 fL (ref 80.0–100.0)
Monocytes Absolute: 0.5 10*3/uL (ref 0.1–1.0)
Monocytes Relative: 9 %
Neutro Abs: 3.7 10*3/uL (ref 1.7–7.7)
Neutrophils Relative %: 69 %
Platelets: 228 10*3/uL (ref 150–400)
RBC: 4.21 MIL/uL (ref 3.87–5.11)
RDW: 13.1 % (ref 11.5–15.5)
WBC: 5.4 10*3/uL (ref 4.0–10.5)
nRBC: 0 % (ref 0.0–0.2)

## 2019-03-13 LAB — COMPREHENSIVE METABOLIC PANEL
ALT: 32 U/L (ref 0–44)
AST: 26 U/L (ref 15–41)
Albumin: 4 g/dL (ref 3.5–5.0)
Alkaline Phosphatase: 73 U/L (ref 38–126)
Anion gap: 8 (ref 5–15)
BUN: 11 mg/dL (ref 6–20)
CO2: 23 mmol/L (ref 22–32)
Calcium: 9.1 mg/dL (ref 8.9–10.3)
Chloride: 109 mmol/L (ref 98–111)
Creatinine, Ser: 0.72 mg/dL (ref 0.44–1.00)
GFR calc Af Amer: >60 mL/min (ref >60)
GFR calc non Af Amer: >60 mL/min (ref >60)
Glucose, Bld: 103 mg/dL — ABNORMAL HIGH (ref 70–99)
Potassium: 4.3 mmol/L (ref 3.5–5.1)
Sodium: 140 mmol/L (ref 135–145)
Total Bilirubin: 0.4 mg/dL (ref 0.3–1.2)
Total Protein: 7 g/dL (ref 6.5–8.1)

## 2019-03-13 MED ORDER — SODIUM CHLORIDE 0.9% FLUSH
10.0000 mL | INTRAVENOUS | Status: DC | PRN
  Administered 2019-03-13: 10 mL
  Filled 2019-03-13: qty 10

## 2019-03-13 MED ORDER — PEGFILGRASTIM 6 MG/0.6ML ~~LOC~~ PSKT
PREFILLED_SYRINGE | SUBCUTANEOUS | Status: AC
  Filled 2019-03-13: qty 0.6

## 2019-03-13 MED ORDER — SODIUM CHLORIDE 0.9 % IV SOLN
Freq: Once | INTRAVENOUS | Status: AC
  Administered 2019-03-13: 10:00:00 via INTRAVENOUS
  Filled 2019-03-13: qty 250

## 2019-03-13 MED ORDER — HEPARIN SOD (PORK) LOCK FLUSH 100 UNIT/ML IV SOLN
500.0000 [IU] | Freq: Once | INTRAVENOUS | Status: AC | PRN
  Administered 2019-03-13: 13:00:00 500 [IU]
  Filled 2019-03-13: qty 5

## 2019-03-13 MED ORDER — SODIUM CHLORIDE 0.9 % IV SOLN
600.0000 mg/m2 | Freq: Once | INTRAVENOUS | Status: AC
  Administered 2019-03-13: 12:00:00 1040 mg via INTRAVENOUS
  Filled 2019-03-13: qty 52

## 2019-03-13 MED ORDER — SODIUM CHLORIDE 0.9 % IV SOLN
Freq: Once | INTRAVENOUS | Status: AC
  Administered 2019-03-13: 11:00:00 via INTRAVENOUS
  Filled 2019-03-13: qty 5

## 2019-03-13 MED ORDER — PALONOSETRON HCL INJECTION 0.25 MG/5ML
0.2500 mg | Freq: Once | INTRAVENOUS | Status: AC
  Administered 2019-03-13: 11:00:00 0.25 mg via INTRAVENOUS

## 2019-03-13 MED ORDER — DOXORUBICIN HCL CHEMO IV INJECTION 2 MG/ML
60.0000 mg/m2 | Freq: Once | INTRAVENOUS | Status: AC
  Administered 2019-03-13: 12:00:00 104 mg via INTRAVENOUS
  Filled 2019-03-13: qty 52

## 2019-03-13 MED ORDER — PEGFILGRASTIM 6 MG/0.6ML ~~LOC~~ PSKT
6.0000 mg | PREFILLED_SYRINGE | Freq: Once | SUBCUTANEOUS | Status: AC
  Administered 2019-03-13: 6 mg via SUBCUTANEOUS

## 2019-03-13 MED ORDER — SODIUM CHLORIDE 0.9% FLUSH
10.0000 mL | INTRAVENOUS | Status: DC | PRN
  Administered 2019-03-13: 09:00:00 10 mL via INTRAVENOUS
  Filled 2019-03-13: qty 10

## 2019-03-13 MED ORDER — PALONOSETRON HCL INJECTION 0.25 MG/5ML
INTRAVENOUS | Status: AC
  Filled 2019-03-13: qty 5

## 2019-03-13 NOTE — Progress Notes (Signed)
After the completion of Cytoxan, pt reported that she felt "a little dizzy from sinus pressure". Pt reported that the pressure and dizziness resolved during the NS flush. Informed pt that the Cytoxan can be slowed down during the next infusion. Pt verbalizes understanding and agrees with plan of care.

## 2019-03-13 NOTE — Patient Instructions (Signed)
[facility] Discharge Instructions for Patients Receiving Chemotherapy  Today you received the following chemotherapy agents Adriamycin and Cytoxan.  To help prevent nausea and vomiting after your treatment, we encourage you to take your nausea medication as directed.  If you develop nausea and vomiting that is not controlled by your nausea medication, call the clinic.   BELOW ARE SYMPTOMS THAT SHOULD BE REPORTED IMMEDIATELY:  *FEVER GREATER THAN 100.5 F  *CHILLS WITH OR WITHOUT FEVER  NAUSEA AND VOMITING THAT IS NOT CONTROLLED WITH YOUR NAUSEA MEDICATION  *UNUSUAL SHORTNESS OF BREATH  *UNUSUAL BRUISING OR BLEEDING  TENDERNESS IN MOUTH AND THROAT WITH OR WITHOUT PRESENCE OF ULCERS  *URINARY PROBLEMS  *BOWEL PROBLEMS  UNUSUAL RASH Items with * indicate a potential emergency and should be followed up as soon as possible.  Feel free to call the clinic should you have any questions or concerns. The clinic phone number is [phone].  Please show the CHEMO ALERT CARD at check-in to the Emergency Department and triage nurse.  Doxorubicin injection What is this medicine? DOXORUBICIN (dox oh ROO bi sin) is a chemotherapy drug. It is used to treat many kinds of cancer like leukemia, lymphoma, neuroblastoma, sarcoma, and Wilms' tumor. It is also used to treat bladder cancer, breast cancer, lung cancer, ovarian cancer, stomach cancer, and thyroid cancer. This medicine may be used for other purposes; ask your health care provider or pharmacist if you have questions. COMMON BRAND NAME(S): Adriamycin, Adriamycin PFS, Adriamycin RDF, Rubex What should I tell my health care provider before I take this medicine? They need to know if you have any of these conditions:  heart disease  history of low blood counts caused by a medicine  liver disease  recent or ongoing radiation therapy  an unusual or allergic reaction to doxorubicin, other chemotherapy agents, other  medicines, foods, dyes, or preservatives  pregnant or trying to get pregnant  breast-feeding How should I use this medicine? This drug is given as an infusion into a vein. It is administered in a hospital or clinic by a specially trained health care professional. If you have pain, swelling, burning or any unusual feeling around the site of your injection, tell your health care professional right away. Talk to your pediatrician regarding the use of this medicine in children. Special care may be needed. Overdosage: If you think you have taken too much of this medicine contact a poison control center or emergency room at once. NOTE: This medicine is only for you. Do not share this medicine with others. What if I miss a dose? It is important not to miss your dose. Call your doctor or health care professional if you are unable to keep an appointment. What may interact with this medicine? This medicine may interact with the following medications:  6-mercaptopurine  paclitaxel  phenytoin  St. John's Wort  trastuzumab  verapamil This list may not describe all possible interactions. Give your health care provider a list of all the medicines, herbs, non-prescription drugs, or dietary supplements you use. Also tell them if you smoke, drink alcohol, or use illegal drugs. Some items may interact with your medicine. What should I watch for while using this medicine? This drug may make you feel generally unwell. This is not uncommon, as chemotherapy can affect healthy cells as well as cancer cells. Report any side effects. Continue your course of treatment even though you feel ill unless your doctor tells you to stop. There is a maximum amount of   this medicine you should receive throughout your life. The amount depends on the medical condition being treated and your overall health. Your doctor will watch how much of this medicine you receive in your lifetime. Tell your doctor if you have taken this  medicine before. You may need blood work done while you are taking this medicine. Your urine may turn red for a few days after your dose. This is not blood. If your urine is dark or brown, call your doctor. In some cases, you may be given additional medicines to help with side effects. Follow all directions for their use. Call your doctor or health care professional for advice if you get a fever, chills or sore throat, or other symptoms of a cold or flu. Do not treat yourself. This drug decreases your body's ability to fight infections. Try to avoid being around people who are sick. This medicine may increase your risk to bruise or bleed. Call your doctor or health care professional if you notice any unusual bleeding. Talk to your doctor about your risk of cancer. You may be more at risk for certain types of cancers if you take this medicine. Do not become pregnant while taking this medicine or for 6 months after stopping it. Women should inform their doctor if they wish to become pregnant or think they might be pregnant. Men should not father a child while taking this medicine and for 6 months after stopping it. There is a potential for serious side effects to an unborn child. Talk to your health care professional or pharmacist for more information. Do not breast-feed an infant while taking this medicine. This medicine has caused ovarian failure in some women and reduced sperm counts in some men This medicine may interfere with the ability to have a child. Talk with your doctor or health care professional if you are concerned about your fertility. This medicine may cause a decrease in Co-Enzyme Q-10. You should make sure that you get enough Co-Enzyme Q-10 while you are taking this medicine. Discuss the foods you eat and the vitamins you take with your health care professional. What side effects may I notice from receiving this medicine? Side effects that you should report to your doctor or health care  professional as soon as possible:  allergic reactions like skin rash, itching or hives, swelling of the face, lips, or tongue  breathing problems  chest pain  fast or irregular heartbeat  low blood counts - this medicine may decrease the number of white blood cells, red blood cells and platelets. You may be at increased risk for infections and bleeding.  pain, redness, or irritation at site where injected  signs of infection - fever or chills, cough, sore throat, pain or difficulty passing urine  signs of decreased platelets or bleeding - bruising, pinpoint red spots on the skin, black, tarry stools, blood in the urine  swelling of the ankles, feet, hands  tiredness  weakness Side effects that usually do not require medical attention (report to your doctor or health care professional if they continue or are bothersome):  diarrhea  hair loss  mouth sores  nail discoloration or damage  nausea  red colored urine  vomiting This list may not describe all possible side effects. Call your doctor for medical advice about side effects. You may report side effects to FDA at 1-800-FDA-1088. Where should I keep my medicine? This drug is given in a hospital or clinic and will not be stored at home. NOTE:   This sheet is a summary. It may not cover all possible information. If you have questions about this medicine, talk to your doctor, pharmacist, or health care provider.  2020 Elsevier/Gold Standard (2017-02-23 11:01:26)   Cyclophosphamide injection What is this medicine? CYCLOPHOSPHAMIDE (sye kloe FOSS fa mide) is a chemotherapy drug. It slows the growth of cancer cells. This medicine is used to treat many types of cancer like lymphoma, myeloma, leukemia, breast cancer, and ovarian cancer, to name a few. This medicine may be used for other purposes; ask your health care provider or pharmacist if you have questions. COMMON BRAND NAME(S): Cytoxan, Neosar What should I tell my  health care provider before I take this medicine? They need to know if you have any of these conditions:  blood disorders  history of other chemotherapy  infection  kidney disease  liver disease  recent or ongoing radiation therapy  tumors in the bone marrow  an unusual or allergic reaction to cyclophosphamide, other chemotherapy, other medicines, foods, dyes, or preservatives  pregnant or trying to get pregnant  breast-feeding How should I use this medicine? This drug is usually given as an injection into a vein or muscle or by infusion into a vein. It is administered in a hospital or clinic by a specially trained health care professional. Talk to your pediatrician regarding the use of this medicine in children. Special care may be needed. Overdosage: If you think you have taken too much of this medicine contact a poison control center or emergency room at once. NOTE: This medicine is only for you. Do not share this medicine with others. What if I miss a dose? It is important not to miss your dose. Call your doctor or health care professional if you are unable to keep an appointment. What may interact with this medicine? This medicine may interact with the following medications:  amiodarone  amphotericin B  azathioprine  certain antiviral medicines for HIV or AIDS such as protease inhibitors (e.g., indinavir, ritonavir) and zidovudine  certain blood pressure medications such as benazepril, captopril, enalapril, fosinopril, lisinopril, moexipril, monopril, perindopril, quinapril, ramipril, trandolapril  certain cancer medications such as anthracyclines (e.g., daunorubicin, doxorubicin), busulfan, cytarabine, paclitaxel, pentostatin, tamoxifen, trastuzumab  certain diuretics such as chlorothiazide, chlorthalidone, hydrochlorothiazide, indapamide, metolazone  certain medicines that treat or prevent blood clots like warfarin  certain muscle relaxants such as  succinylcholine  cyclosporine  etanercept  indomethacin  medicines to increase blood counts like filgrastim, pegfilgrastim, sargramostim  medicines used as general anesthesia  metronidazole  natalizumab This list may not describe all possible interactions. Give your health care provider a list of all the medicines, herbs, non-prescription drugs, or dietary supplements you use. Also tell them if you smoke, drink alcohol, or use illegal drugs. Some items may interact with your medicine. What should I watch for while using this medicine? Visit your doctor for checks on your progress. This drug may make you feel generally unwell. This is not uncommon, as chemotherapy can affect healthy cells as well as cancer cells. Report any side effects. Continue your course of treatment even though you feel ill unless your doctor tells you to stop. Drink water or other fluids as directed. Urinate often, even at night. In some cases, you may be given additional medicines to help with side effects. Follow all directions for their use. Call your doctor or health care professional for advice if you get a fever, chills or sore throat, or other symptoms of a cold or flu. Do not   treat yourself. This drug decreases your body's ability to fight infections. Try to avoid being around people who are sick. This medicine may increase your risk to bruise or bleed. Call your doctor or health care professional if you notice any unusual bleeding. Be careful brushing and flossing your teeth or using a toothpick because you may get an infection or bleed more easily. If you have any dental work done, tell your dentist you are receiving this medicine. You may get drowsy or dizzy. Do not drive, use machinery, or do anything that needs mental alertness until you know how this medicine affects you. Do not become pregnant while taking this medicine or for 1 year after stopping it. Women should inform their doctor if they wish to  become pregnant or think they might be pregnant. Men should not father a child while taking this medicine and for 4 months after stopping it. There is a potential for serious side effects to an unborn child. Talk to your health care professional or pharmacist for more information. Do not breast-feed an infant while taking this medicine. This medicine may interfere with the ability to have a child. This medicine has caused ovarian failure in some women. This medicine has caused reduced sperm counts in some men. You should talk with your doctor or health care professional if you are concerned about your fertility. If you are going to have surgery, tell your doctor or health care professional that you have taken this medicine. What side effects may I notice from receiving this medicine? Side effects that you should report to your doctor or health care professional as soon as possible:  allergic reactions like skin rash, itching or hives, swelling of the face, lips, or tongue  low blood counts - this medicine may decrease the number of white blood cells, red blood cells and platelets. You may be at increased risk for infections and bleeding.  signs of infection - fever or chills, cough, sore throat, pain or difficulty passing urine  signs of decreased platelets or bleeding - bruising, pinpoint red spots on the skin, black, tarry stools, blood in the urine  signs of decreased red blood cells - unusually weak or tired, fainting spells, lightheadedness  breathing problems  dark urine  dizziness  palpitations  swelling of the ankles, feet, hands  trouble passing urine or change in the amount of urine  weight gain  yellowing of the eyes or skin Side effects that usually do not require medical attention (report to your doctor or health care professional if they continue or are bothersome):  changes in nail or skin color  hair loss  missed menstrual periods  mouth sores  nausea,  vomiting This list may not describe all possible side effects. Call your doctor for medical advice about side effects. You may report side effects to FDA at 1-800-FDA-1088. Where should I keep my medicine? This drug is given in a hospital or clinic and will not be stored at home. NOTE: This sheet is a summary. It may not cover all possible information. If you have questions about this medicine, talk to your doctor, pharmacist, or health care provider.  2020 Elsevier/Gold Standard (2012-05-26 16:22:58)  

## 2019-03-13 NOTE — Telephone Encounter (Signed)
Left vm to assess needs during 1st AC. Contact information provided for questions or needs.

## 2019-03-14 ENCOUNTER — Telehealth: Payer: Self-pay | Admitting: *Deleted

## 2019-03-14 MED ORDER — PROMETHAZINE HCL 25 MG PO TABS: 25.0000 mg | ORAL_TABLET | Freq: Four times a day (QID) | ORAL | 1 refills | Status: DC | PRN

## 2019-03-14 MED ORDER — PROMETHAZINE HCL 25 MG RE SUPP: 25.0000 mg | Freq: Four times a day (QID) | RECTAL | 0 refills | Status: DC | PRN

## 2019-03-14 NOTE — Telephone Encounter (Signed)
This RN spoke with pt per her call stating she had " severe breakthrough nausea with some vomiting" over the night.  She states she used compazine with benefit " for about 2 hours then the symptoms returned " she had zofran 4 mg left over from surgery and used it with some benefit.  Kalie states she was up almost all night with above issues ( noted as per on call documentation )  This AM Mishal states she " is doing better and able to hydrate well ".  Per discussion Lailah states she has had issues with nausea in other cases and she has used phenergan with good outcome. She is asking " could I get phenergan "?  This RN discussed above including pt's concern for " feeling this way through all my treatments " with validation that regimen / support medications with be tweaked for better outcome.  This RN offered for pt to come in for IVF with Cheyla declining " I feel well hydrated ".  This RN informed pt above information with be given to MD for review as well as prescription for phenergan will be sent to her pharmacy. Pharmacy verified.  This RN also discussed rehydration as well.  No further needs at this time. Pt stated appreciation as well as " feeling better knowing I do not have to feel like this with each treatment ".

## 2019-03-14 NOTE — Telephone Encounter (Signed)
-----   Message from Veverly Fells, RN sent at 03/13/2019  1:40 PM EDT ----- Regarding: MAGRINAT First-time follow-up First time [facility] today. Pt tolerated well with no complaints.

## 2019-03-14 NOTE — Telephone Encounter (Signed)
Called pt to discuss how she did with her treatment yesterday.  She reports that she had a rough night with nausea, chills, sweats, sinus pressure, runny nose, teeth pain & has already talked with Dr Magrinat's nurse this am & script for phenergan has been sent in.  Discussed alternating compazine & phenergan to try to get nausea under control.  Reminded of sleep effects.  She states that she is trying to drink & so far OK & states she will call if she feels she needs IVF.  She denies any other symptoms & knows she can call with problems/concerns.

## 2019-03-19 ENCOUNTER — Telehealth: Payer: Self-pay | Admitting: *Deleted

## 2019-03-19 NOTE — Telephone Encounter (Signed)
Pt informed of feeling like "have flu without the fever" and vomiting. Denies fever and nausea. Discussed for some pts they experience "flu like symptoms" with neulasta. Pt continues with Claritin. Taking Zofran for vomiting.  Discussed compazine as well and phenergan. Offered to bring pt in for [facility] and IVF/ anti-nausea medications. Pt relate she is able to keep water down now and has been able to rest well and is feeling better for the last hr. Informed pt to please call with any more vomiting or inability to stay hydrated and in the importance of stay hydrated. Received verbal understanding.

## 2019-03-20 ENCOUNTER — Other Ambulatory Visit: Payer: Self-pay | Admitting: *Deleted

## 2019-03-20 ENCOUNTER — Inpatient Hospital Stay: Payer: 59

## 2019-03-20 ENCOUNTER — Telehealth: Payer: Self-pay | Admitting: Medical

## 2019-03-20 ENCOUNTER — Telehealth: Payer: Self-pay | Admitting: *Deleted

## 2019-03-20 ENCOUNTER — Other Ambulatory Visit: Payer: Self-pay

## 2019-03-20 ENCOUNTER — Inpatient Hospital Stay (HOSPITAL_BASED_OUTPATIENT_CLINIC_OR_DEPARTMENT_OTHER): Payer: 59 | Admitting: Medical

## 2019-03-20 VITALS — BP 112/86 | HR 78 | Temp 98.3°F | Resp 18 | Ht 64.0 in | Wt 135.4 lb

## 2019-03-20 DIAGNOSIS — R112 Nausea with vomiting, unspecified: Secondary | ICD-10-CM | POA: Diagnosis not present

## 2019-03-20 DIAGNOSIS — D701 Agranulocytosis secondary to cancer chemotherapy: Secondary | ICD-10-CM | POA: Diagnosis not present

## 2019-03-20 DIAGNOSIS — C50812 Malignant neoplasm of overlapping sites of left female breast: Secondary | ICD-10-CM

## 2019-03-20 DIAGNOSIS — Z170 Estrogen receptor positive status [ER+]: Secondary | ICD-10-CM

## 2019-03-20 DIAGNOSIS — Z5111 Encounter for antineoplastic chemotherapy: Secondary | ICD-10-CM | POA: Diagnosis not present

## 2019-03-20 DIAGNOSIS — E860 Dehydration: Secondary | ICD-10-CM

## 2019-03-20 DIAGNOSIS — T451X5A Adverse effect of antineoplastic and immunosuppressive drugs, initial encounter: Secondary | ICD-10-CM

## 2019-03-20 LAB — CMP (CANCER CENTER ONLY)
ALT: 40 U/L (ref 0–44)
AST: 13 U/L — ABNORMAL LOW (ref 15–41)
Albumin: 4.1 g/dL (ref 3.5–5.0)
Alkaline Phosphatase: 82 U/L (ref 38–126)
Anion gap: 10 (ref 5–15)
BUN: 11 mg/dL (ref 6–20)
CO2: 29 mmol/L (ref 22–32)
Calcium: 10 mg/dL (ref 8.9–10.3)
Chloride: 97 mmol/L — ABNORMAL LOW (ref 98–111)
Creatinine: 0.85 mg/dL (ref 0.44–1.00)
GFR, Est AFR Am: >60 mL/min (ref >60)
GFR, Estimated: >60 mL/min (ref >60)
Glucose, Bld: 115 mg/dL — ABNORMAL HIGH (ref 70–99)
Potassium: 4.3 mmol/L (ref 3.5–5.1)
Sodium: 136 mmol/L (ref 135–145)
Total Bilirubin: 0.7 mg/dL (ref 0.3–1.2)
Total Protein: 7.4 g/dL (ref 6.5–8.1)

## 2019-03-20 LAB — CBC WITH DIFFERENTIAL (CANCER CENTER ONLY)
Abs Immature Granulocytes: 0.01 10*3/uL (ref 0.00–0.07)
Basophils Absolute: 0 10*3/uL (ref 0.0–0.1)
Basophils Relative: 2 %
Eosinophils Absolute: 0.1 10*3/uL (ref 0.0–0.5)
Eosinophils Relative: 15 %
HCT: 39.1 % (ref 36.0–46.0)
Hemoglobin: 13 g/dL (ref 12.0–15.0)
Immature Granulocytes: 1 %
Lymphocytes Relative: 46 %
Lymphs Abs: 0.4 10*3/uL — ABNORMAL LOW (ref 0.7–4.0)
MCH: 29.8 pg (ref 26.0–34.0)
MCHC: 33.2 g/dL (ref 30.0–36.0)
MCV: 89.7 fL (ref 80.0–100.0)
Monocytes Absolute: 0.1 10*3/uL (ref 0.1–1.0)
Monocytes Relative: 16 %
Neutro Abs: 0.2 10*3/uL — CL (ref 1.7–7.7)
Neutrophils Relative %: 20 %
Platelet Count: 55 10*3/uL — ABNORMAL LOW (ref 150–400)
RBC: 4.36 MIL/uL (ref 3.87–5.11)
RDW: 12.5 % (ref 11.5–15.5)
WBC Count: 0.9 10*3/uL — CL (ref 4.0–10.5)
nRBC: 0 % (ref 0.0–0.2)

## 2019-03-20 MED ORDER — SODIUM CHLORIDE 0.9% FLUSH
10.0000 mL | Freq: Once | INTRAVENOUS | Status: AC
  Administered 2019-03-20: 12:00:00 10 mL
  Filled 2019-03-20: qty 10

## 2019-03-20 MED ORDER — SODIUM CHLORIDE 0.9 % IV SOLN
Freq: Once | INTRAVENOUS | Status: AC
  Administered 2019-03-20: 11:00:00 via INTRAVENOUS
  Filled 2019-03-20: qty 250

## 2019-03-20 MED ORDER — PROCHLORPERAZINE MALEATE 10 MG PO TABS
10.0000 mg | ORAL_TABLET | Freq: Once | ORAL | Status: AC
  Administered 2019-03-20: 10 mg via ORAL

## 2019-03-20 MED ORDER — HEPARIN SOD (PORK) LOCK FLUSH 100 UNIT/ML IV SOLN
500.0000 [IU] | Freq: Once | INTRAVENOUS | Status: AC
  Administered 2019-03-20: 500 [IU]
  Filled 2019-03-20: qty 5

## 2019-03-20 MED ORDER — LORAZEPAM 0.5 MG PO TABS: 0.5000 mg | ORAL_TABLET | Freq: Four times a day (QID) | ORAL | 0 refills | Status: DC | PRN

## 2019-03-20 MED ORDER — PROCHLORPERAZINE MALEATE 10 MG PO TABS
ORAL_TABLET | ORAL | Status: AC
  Filled 2019-03-20: qty 1

## 2019-03-20 NOTE — Progress Notes (Signed)
Critical WBC count of 0.9 and ANC of 0.2 received from Pam in lab.  PA Lucianne Lei made aware.  VO per PA Van to increase IVF rate from 521ml/hr to 999 ml/hr.  Pt received and tolerated 1L IVF NS.  Ate and drank during infusion.  Given antiemetics, reports no nausea at end of tx.  Reports feeling better afterwards.  Pt denies any questions or concerns at time of d/c but is aware to f/u as needed for more ivf/evaluation.  VSS.

## 2019-03-20 NOTE — Progress Notes (Signed)
[facility] Progress Note   Tonya Whitehead [id number] 03-Oct-1975 42 y.o.  Tonya Whitehead is managed by Dr. Jana Hakim   Actively treated with chemotherapy/immunotherapy/hormonal therapy: yes  Current therapy: Doxorubicin and cyclophosphamide  Last treated: 03/13/2019 (cycle 1)  Next scheduled appointment with provider: 03/21/2019  Assessment: Plan:    Malignant neoplasm of overlapping sites of left breast in female, estrogen receptor positive ([place])  Dehydration - Plan: 0.9 %  sodium chloride infusion, prochlorperazine (COMPAZINE) tablet 10 mg, sodium chloride flush (NS) 0.9 % injection 10 mL, heparin lock flush 100 unit/mL  Non-intractable vomiting with nausea, unspecified vomiting type  Chemotherapy-induced neutropenia ([place])   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  She is scheduled to see Dr. Jana Hakim in follow-up on 03/21/2019.  A CBC and comprehensive chemistry panel were collected today.  Dehydration: A CBC and comprehensive chemistry panel were collected today.  The patient was given 1 L normal saline IV.  Nausea and vomiting: The patient was given Compazine 10 mg p.o. x1 today.  Additionally a prescription for Ativan 0.5 mg sublingual every 6 hours as needed was sent to her pharmacy.  Neutropenia: His CBC returned with a WBC of 0.9 and an ANC of 0.2.  The patient was dosed with Onpro with her chemotherapy.    Please see After Visit Summary for patient specific instructions.  Future Appointments  Date Time Provider [place]  03/21/2019  7:30 AM [facility] 3 [facility] None  03/21/2019  8:00 AM [facility] [place] FLUSH [facility] None  03/21/2019  8:15 AM Magrinat, Virgie Dad, MD [facility] None  03/21/2019  9:00 AM [facility] INFUSION [facility] None  03/27/2019  7:45 AM [facility] LAB 4 [facility] None  03/27/2019  8:00 AM [facility]  [place] FLUSH [facility] None  03/27/2019  8:30 AM Magrinat, Virgie Dad, MD [facility] None  03/27/2019  9:15 AM [facility] INFUSION [facility] None  04/10/2019  8:15 AM [facility] LAB 3 [facility] None  04/10/2019  8:30 AM [facility] [place] FLUSH [facility] None  04/10/2019  9:15 AM [facility] INFUSION [facility] None    No orders of the defined types were placed in this encounter.      Subjective:   Patient ID:  Tonya Whitehead is a 43 y.o. (DOB 11-23-1975) female.  Chief Complaint:  Chief Complaint  Patient presents with   Fatigue    HPI Tonya Whitehead   is a 43 year old female with a history of an ER positive malignant neoplasm of the left breast who is managed by Dr. Jana Hakim and is status post cycle 1 of doxorubicin and cyclophosphamide which was dosed on 03/13/2019 in a dose dense fashion.  She contacted her office this morning stating that she was feeling "shaky" and having a headache.  She is also had nausea and vomiting.  She feels as though she is dehydrated.  Medications: I have reviewed the patient's current medications.  Allergies:  Allergies  Allergen Reactions   Gluten Meal Hives    Joint pain, upset stomach   Sulfa Antibiotics     Unknown reaction " I was told I had a reaction when I was a baby "    Past Medical History:  Diagnosis Date   Breast cancer ([place]) 12/08/2018   invasive lobular   Family history of bladder cancer    Family history of colon cancer    Family history of lung cancer    Family history of ovarian  cancer     Past Surgical History:  Procedure Laterality Date   ADENOIDECTOMY     as a child   BREAST EXCISIONAL BIOPSY Left 2002   reconstructive surgery on nipple to match right breast   IR IMAGING GUIDED PORT INSERTION  03/08/2019    Family History  Problem Relation Age of Onset   Lung cancer Mother 25       smoker   Ovarian cancer Mother 39   Colon cancer Maternal Aunt        79s   Bladder Cancer Paternal  Grandfather        death was unrelated   Cancer Paternal Aunt        through PGM, blood cancer    Social History   Socioeconomic History   Marital status: Married    Spouse name: Doug   Number of children: 3   Years of education: Not on file   Highest education level: Not on file  Occupational History   Not on file  Social Needs   Financial resource strain: Not on file   Food insecurity    Worry: Not on file    Inability: Not on file   Transportation needs    Medical: Not on file    Non-medical: Not on file  Tobacco Use   Smoking status: Never Smoker   Smokeless tobacco: Never Used  Substance and Sexual Activity   Alcohol use: Not on file   Drug use: Never   Sexual activity: Yes    Birth control/protection: Other-see comments    Comment: husband had vasectomy  Lifestyle   Physical activity    Days per week: Not on file    Minutes per session: Not on file   Stress: Not on file  Relationships   Social connections    Talks on phone: Not on file    Gets together: Not on file    Attends religious service: Not on file    Active member of club or organization: Not on file    Attends meetings of clubs or organizations: Not on file    Relationship status: Not on file   Intimate partner violence    Fear of current or ex partner: Not on file    Emotionally abused: Not on file    Physically abused: Not on file    Forced sexual activity: Not on file  Other Topics Concern   Not on file  Social History Narrative   Not on file    Past Medical History, Surgical history, Social history, and Family history were reviewed and updated as appropriate.   Please see review of systems for further details on the patient's review from today.   Review of Systems:  Review of Systems  Constitutional: Negative for appetite change, chills, diaphoresis and fever.       "Shakiness"  HENT: Negative for dental problem, mouth sores and trouble swallowing.     Respiratory: Negative for cough, chest tightness and shortness of breath.   Cardiovascular: Negative for chest pain and palpitations.  Gastrointestinal: Positive for nausea and vomiting. Negative for constipation and diarrhea.  Neurological: Positive for weakness. Negative for dizziness, syncope and headaches.    Objective:   Physical Exam:  BP 112/86    Pulse 78    Temp 98.3 F (36.8 C) (Temporal)    Resp 18    Ht 5\' 4"  (1.626 m)    Wt 135 lb 6.4 oz (61.4 kg)    SpO2 100%  BMI 23.24 kg/m  ECOG: 1  Physical Exam Constitutional:      General: She is not in acute distress.    Appearance: She is not diaphoretic.  HENT:     Head: Normocephalic and atraumatic.  Cardiovascular:     Rate and Rhythm: Normal rate and regular rhythm.     Heart sounds: Normal heart sounds. No murmur. No friction rub. No gallop.   Pulmonary:     Effort: Pulmonary effort is normal. No respiratory distress.     Breath sounds: Normal breath sounds. No wheezing or rales.  Skin:    General: Skin is warm and dry.     Findings: No erythema or rash.  Neurological:     Mental Status: She is alert.     Lab Review:     Component Value Date/Time   NA 136 03/20/2019 1022   K 4.3 03/20/2019 1022   CL 97 (L) 03/20/2019 1022   CO2 29 03/20/2019 1022   GLUCOSE 115 (H) 03/20/2019 1022   BUN 11 03/20/2019 1022   CREATININE 0.85 03/20/2019 1022   CALCIUM 10.0 03/20/2019 1022   PROT 7.4 03/20/2019 1022   ALBUMIN 4.1 03/20/2019 1022   AST 13 (L) 03/20/2019 1022   ALT 40 03/20/2019 1022   ALKPHOS 82 03/20/2019 1022   BILITOT 0.7 03/20/2019 1022   GFRNONAA >60 03/20/2019 1022   GFRAA >60 03/20/2019 1022       Component Value Date/Time   WBC 0.9 (LL) 03/20/2019 1022   WBC 5.4 03/13/2019 0858   RBC 4.36 03/20/2019 1022   HGB 13.0 03/20/2019 1022   HCT 39.1 03/20/2019 1022   PLT 55 (L) 03/20/2019 1022   MCV 89.7 03/20/2019 1022   MCH 29.8 03/20/2019 1022   MCHC 33.2 03/20/2019 1022   RDW 12.5  03/20/2019 1022   LYMPHSABS 0.4 (L) 03/20/2019 1022   MONOABS 0.1 03/20/2019 1022   EOSABS 0.1 03/20/2019 1022   BASOSABS 0.0 03/20/2019 1022   -------------------------------  Imaging from last 24 hours (if applicable):  Radiology interpretation: Ir Imaging Guided Port Insertion  Result Date: 03/08/2019 INDICATION: 43 year old female with left-sided breast cancer. She presents for port catheter placement. EXAM: IMPLANTED PORT A CATH PLACEMENT WITH ULTRASOUND AND FLUOROSCOPIC GUIDANCE MEDICATIONS: 2 g Ancef; The antibiotic was administered within an appropriate time interval prior to skin puncture. ANESTHESIA/SEDATION: Versed 4 mg IV; Fentanyl 100 mcg IV; Moderate Sedation Time:  23 minutes The patient was continuously monitored during the procedure by the interventional radiology nurse under my direct supervision. FLUOROSCOPY TIME:  0 minutes, 18 seconds (2 mGy) COMPLICATIONS: None immediate. PROCEDURE: The right neck and chest was prepped with chlorhexidine, and draped in the usual sterile fashion using maximum barrier technique (cap and mask, sterile gown, sterile gloves, large sterile sheet, hand hygiene and cutaneous antiseptic). Local anesthesia was attained by infiltration with 1% lidocaine with epinephrine. Ultrasound demonstrated patency of the right internal jugular vein, and this was documented with an image. Under real-time ultrasound guidance, this vein was accessed with a 21 gauge micropuncture needle and image documentation was performed. A small dermatotomy was made at the access site with an 11 scalpel. A 0.018" wire was advanced into the SVC and the access needle exchanged for a 49F micropuncture vascular sheath. The 0.018" wire was then removed and a 0.035" wire advanced into the IVC. An appropriate location for the subcutaneous reservoir was selected below the clavicle and an incision was made through the skin and underlying soft tissues. The subcutaneous  tissues were then dissected  using a combination of blunt and sharp surgical technique and a pocket was formed. A single lumen power injectable portacatheter was then tunneled through the subcutaneous tissues from the pocket to the dermatotomy and the port reservoir placed within the subcutaneous pocket. The venous access site was then serially dilated and a peel away vascular sheath placed over the wire. The wire was removed and the port catheter advanced into position under fluoroscopic guidance. The catheter tip is positioned in the superior cavoatrial junction. This was documented with a spot image. The portacatheter was then tested and found to flush and aspirate well. The port was flushed with saline followed by 100 units/mL heparinized saline. The pocket was then closed in two layers using first subdermal inverted interrupted absorbable sutures followed by a running subcuticular suture. The epidermis was then sealed with Dermabond. The dermatotomy at the venous access site was also closed with Dermabond. IMPRESSION: Successful placement of a right IJ approach Power Port with ultrasound and fluoroscopic guidance. The catheter is ready for use. Electronically Signed   By: Jacqulynn Cadet M.D.   On: 03/08/2019 14:56

## 2019-03-20 NOTE — Telephone Encounter (Signed)
No los per 8/25. °

## 2019-03-20 NOTE — Progress Notes (Signed)
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 She was last seen here on 03/08/2019.   She was seen yesterday in our acute care clinic with intractable nausea and vomiting.  She was given IV fluids and her antiemetics were revised.  Also her counts were obtained and despite receiving OnPro her [facility] yesterday was 0.9 with a neutrophil count of 0.2.  She started chemotherapy 03/13/2019, consisting of cyclophosphamide and doxorubicin in dose dense fashion x4. Today is day 9 cycle 1.  She started having nausea and vomiting the night of chemo, together with some sinus pressure and tooth pain she says.  She had continuing vomiting for the next 2 days.  By day 4 things were little better she did okay on Saturday and then on Sunday on Monday as she started having nausea again, with dehydration.  She was seen yesterday and given IV fluids which made a significant difference to the way she felt.  She was also started on Zofran which helped some.   REVIEW OF SYSTEMS: Tonya Whitehead tells me her port worked well.  She did not have bony aches or pains related to the OnPro.  She did not have any mouth sores.  She is having regular in fact she thinks perhaps too many bowel  movements with the help of MiraLAX.  There has been no urine issues.  There have been no unusual headaches visual changes dizziness problems with balance or falls.  There is been no rash or fever.  Detailed review of systems today was otherwise stable.   HISTORY OF CURRENT ILLNESS: From the original intake note:  Tonya Whitehead notes a history of reconstructive left breast surgery in 2002 while living in [place], [place]. That was for symmetry. The left breast accordingly had some scar tissue and she thought that was what she was noting until mid March, when she felt the left breast was changing more and the nipple seemed to be sinking subtly.  She brought this to Dr Dahlia Bailiff attention and underwent bilateral diagnostic mammography with tomography and bilateral breast ultrasonography at [facility] on 12/01/2018 showing: breast density category C; findings concerning for extensive malignancy throughout the left breast predominately involving the upper- and lower-outer quadrants.  The mass was palpable within the upper outer left breast and there was left nipple retraction as well as indentation along the inferior margin of the left breast.  Targeted ultrasound showed three masses measuring 5.6 cm (2 o'clock position), 4.0 cm (close to the nipple and contiguous to the prior mass), and 3.3 cm (6 o'clock position.).  Also noted was a borderline left axillary lymph node adjacent to the left axillary artery (and so not easy to biopsy).  In the right breast was noted a 1.3 cm cyst.  On 12/08/2018 Tonya Whitehead proceeded to biopsy of 2 of the left breast areas in question. The pathology from  this procedure ([id number]) showed: invasive mammary carcinoma, grade 2, in two cores (at 2 o'clock and 6 o'clock) with identical histomorphology; e-cadherin is negative, consistent with a lobular phenotype. Prognostic indicators significant for: estrogen receptor, 70% positive and progesterone receptor, 70% positive, both with  strong staining intensity. Proliferation marker Ki67 at 3%. HER2 negative by immunohistochemistry (1+).  Note also the patient had a thyroid ultrasound 07/18/2018 showing bilateral solitary nodules, which were biopsied 08/09/2018.  Both showed follicular nodules, Bethesda category 2 (benign).  The patient's subsequent history is as detailed below.   PAST MEDICAL HISTORY: Past Medical History:  Diagnosis Date   Breast cancer ([place]) 12/08/2018   invasive lobular   Family history of bladder cancer    Family history of colon cancer    Family history of lung cancer    Family history of ovarian cancer     PAST SURGICAL HISTORY: Past Surgical History:  Procedure Laterality Date   ADENOIDECTOMY     as a child   BREAST EXCISIONAL BIOPSY Left 2002   reconstructive surgery on nipple to match right breast   IR IMAGING GUIDED PORT INSERTION  03/08/2019  Adenoidectomy as a child.   FAMILY HISTORY: Family History  Problem Relation Age of Onset   Lung cancer Mother 96       smoker   Ovarian cancer Mother 46   Colon cancer Maternal Aunt        38s   Bladder Cancer Paternal Grandfather        death was unrelated   Cancer Paternal Aunt        through [facility], blood cancer   As of May 2020 patient's father is alive at age 75. Patient's mother is also living at age 37. She has ovarian cancer, diagnosed in 2019, and lung cancer (stage IV), diagnosed in 2016. She tested negative for BRCA.  A maternal aunt had colon cancer in her 71s.  3 other siblings of the patient's mother have not had any cancer.  On the paternal side the paternal grandmother had bladder cancer.  A paternal great aunt died from cancer of the pancreas.     GYNECOLOGIC HISTORY:  No LMP recorded. Menarche: 43 years old Age at first live birth: 43 years old GX P 3 LMP every month, 5 total days with 2 heavy Contraceptive no, husband with vasectomy. HRT n/a  Hysterectomy? no BSO? 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 Husband [name] is a physical therapist. He is biologically [place] and was adopted by a [place] family.  The patient lives at home with her husband and their 3 children: [name] is 25, [name] is 78, and  [name], 57.  The patient attends a [employer].    ADVANCED DIRECTIVES: Husband [name] is her HCPOA   HEALTH MAINTENANCE: Social History   Tobacco Use   Smoking status: Never Smoker   Smokeless tobacco: Never Used  Substance Use Topics   Alcohol use: Not on file   Drug use: Never     Colonoscopy: never done  PAP: not on file  Bone density: never done   Allergies  Allergen Reactions   Gluten Meal Hives    Joint pain, upset stomach   Sulfa Antibiotics     Unknown reaction " I was told I had a reaction when I was a baby "    Current Outpatient Medications  Medication Sig Dispense Refill   ciprofloxacin (CIPRO) 500 MG tablet Take  one tablet twice daily for 5 days starting day 8 after each chemotherapy treatment 40 tablet 1   dexamethasone (DECADRON) 4 MG tablet Take 2 tablets by mouth once a day on the day after chemotherapy and then take 2 tablets two times a day for 2 days. Take with food. 30 tablet 1   lidocaine-prilocaine (EMLA) cream Apply to affected area once 30 g 3   loratadine (CLARITIN) 10 MG tablet Take 10 mg by mouth daily.     LORazepam (ATIVAN) 0.5 MG tablet Place 1 tablet (0.5 mg total) under the tongue every 6 (six) hours as needed (Nausea). 40 tablet 0   metoCLOPramide (REGLAN) 5 MG tablet Take 1 tablet (5 mg total) by mouth 4 (four) times daily -  before meals and at bedtime. 40 tablet 3   ondansetron (ZOFRAN-ODT) 4 MG disintegrating tablet      polyethylene glycol (MIRALAX / GLYCOLAX) 17 g packet Take 17 g by mouth daily as needed for mild constipation.     prochlorperazine (COMPAZINE) 10 MG tablet Take 1 tablet (10 mg total) by mouth every 6 (six) hours as needed (Nausea or  vomiting). 30 tablet 1   promethazine (PHENERGAN) 25 MG suppository Place 1 suppository (25 mg total) rectally every 8 (eight) hours as needed for nausea or vomiting. 12 each 0   promethazine (PHENERGAN) 25 MG tablet Take 1 tablet (25 mg total) by mouth every 6 (six) hours as needed for nausea or vomiting. 30 tablet 1   No current facility-administered medications for this visit.     OBJECTIVE: Young white woman who appears stated age  11:   03/21/19 0839  BP: 115/71  Pulse: 90  Resp: 18  Temp: 98.3 F (36.8 C)  SpO2: 100%   Wt Readings from Last 3 Encounters:  03/21/19 135 lb 9.6 oz (61.5 kg)  03/20/19 135 lb 6.4 oz (61.4 kg)  03/08/19 139 lb 6.4 oz (63.2 kg)   Body mass index is 23.28 kg/m.    ECOG FS: 1  Ocular: Sclerae unicteric, pupils round and equal Ear-nose-throat: Wearing a mask; goiter as previously noted Lymphatic: No cervical or supraclavicular adenopathy Lungs no rales or rhonchi Heart regular rate and rhythm Abd soft, nontender, positive bowel sounds MSK no focal spinal tenderness, no joint edema Neuro: non-focal, well-oriented, appropriate affect Breasts: The right breast is unremarkable.  The left breast is status post mastectomy.  There is no chest wall recurrence.  Both axillae are benign.    LAB RESULTS:  CMP     Component Value Date/Time   NA 136 03/20/2019 1022   K 4.3 03/20/2019 1022   CL 97 (L) 03/20/2019 1022   CO2 29 03/20/2019 1022   GLUCOSE 115 (H) 03/20/2019 1022   BUN 11 03/20/2019 1022   CREATININE 0.85 03/20/2019 1022   CALCIUM 10.0 03/20/2019 1022   PROT 7.4 03/20/2019 1022   ALBUMIN 4.1 03/20/2019 1022   AST 13 (L) 03/20/2019 1022   ALT 40 03/20/2019 1022   ALKPHOS 82 03/20/2019 1022   BILITOT 0.7 03/20/2019 1022   GFRNONAA >60 03/20/2019 1022   GFRAA >60 03/20/2019 1022    No results found for: TOTALPROTELP, ALBUMINELP, A1GS, A2GS, BETS, BETA2SER, GAMS, MSPIKE, SPEI  No results found for: KPAFRELGTCHN, LAMBDASER,  KAPLAMBRATIO  Lab Results  Component Value Date   WBC 0.9 (LL) 03/20/2019   NEUTROABS 0.2 (LL) 03/20/2019   HGB 13.0 03/20/2019   HCT 39.1 03/20/2019   MCV 89.7 03/20/2019   PLT 55 (L)  03/20/2019    '@LASTCHEMISTRY' @  No results found for: LABCA2  No components found for: [id number]  No results for input(s): INR in the last 168 hours.  No results found for: LABCA2  No results found for: UKG254  No results found for: YHC623  No results found for: JSE831  No results found for: CA2729  No components found for: HGQUANT  No results found for: CEA1 / No results found for: CEA1   No results found for: AFPTUMOR  No results found for: CHROMOGRNA  No results found for: PSA1  Appointment on 03/20/2019  Component Date Value Ref Range Status   Sodium 03/20/2019 136  135 - 145 mmol/L Final   Potassium 03/20/2019 4.3  3.5 - 5.1 mmol/L Final   Chloride 03/20/2019 97* 98 - 111 mmol/L Final   CO2 03/20/2019 29  22 - 32 mmol/L Final   Glucose, Bld 03/20/2019 115* 70 - 99 mg/dL Final   BUN 03/20/2019 11  6 - 20 mg/dL Final   Creatinine 03/20/2019 0.85  0.44 - 1.00 mg/dL Final   Calcium 03/20/2019 10.0  8.9 - 10.3 mg/dL Final   Total Protein 03/20/2019 7.4  6.5 - 8.1 g/dL Final   Albumin 03/20/2019 4.1  3.5 - 5.0 g/dL Final   AST 03/20/2019 13* 15 - 41 U/L Final   ALT 03/20/2019 40  0 - 44 U/L Final   Alkaline Phosphatase 03/20/2019 82  38 - 126 U/L Final   Total Bilirubin 03/20/2019 0.7  0.3 - 1.2 mg/dL Final   GFR, Est Non Af Am 03/20/2019 >60  >60 mL/min Final   GFR, Est AFR Am 03/20/2019 >60  >60 mL/min Final   Anion gap 03/20/2019 10  5 - 15 Final   Performed at [facility], [place] [address], [place], [place] [address]   WBC Count 03/20/2019 0.9* 4.0 - 10.5 K/uL Final   This critical result has verified and been called to L[place] Johns by Loletta Parish on 08 25 2020 at 1113, and has been read back.    RBC 03/20/2019 4.36  3.87 - 5.11  MIL/uL Final   Hemoglobin 03/20/2019 13.0  12.0 - 15.0 g/dL Final   HCT 03/20/2019 39.1  36.0 - 46.0 % Final   MCV 03/20/2019 89.7  80.0 - 100.0 fL Final   MCH 03/20/2019 29.8  26.0 - 34.0 pg Final   MCHC 03/20/2019 33.2  30.0 - 36.0 g/dL Final   RDW 03/20/2019 12.5  11.5 - 15.5 % Final   Platelet Count 03/20/2019 55* 150 - 400 K/uL Final   nRBC 03/20/2019 0.0  0.0 - 0.2 % Final   Neutrophils Relative % 03/20/2019 20  % Final   Neutro Abs 03/20/2019 0.2* 1.7 - 7.7 K/uL Final   This critical result has verified and been called to Leland Johns by Loletta Parish on 08 25 2020 at 1114, and has been read back.    Lymphocytes Relative 03/20/2019 46  % Final   Lymphs Abs 03/20/2019 0.4* 0.7 - 4.0 K/uL Final   Monocytes Relative 03/20/2019 16  % Final   Monocytes Absolute 03/20/2019 0.1  0.1 - 1.0 K/uL Final   Eosinophils Relative 03/20/2019 15  % Final   Eosinophils Absolute 03/20/2019 0.1  0.0 - 0.5 K/uL Final   Basophils Relative 03/20/2019 2  % Final   Basophils Absolute 03/20/2019 0.0  0.0 - 0.1 K/uL Final   WBC Morphology 03/20/2019 OCC ATYPICAL LYMPH   Final   Immature Granulocytes 03/20/2019  1  % Final   Abs Immature Granulocytes 03/20/2019 0.01  0.00 - 0.07 K/uL Final   Performed at [facility], [place] [address], [place], [place] [address]    (this displays the last labs from the last 3 days)  No results found for: TOTALPROTELP, ALBUMINELP, A1GS, A2GS, BETS, BETA2SER, GAMS, MSPIKE, SPEI (this displays SPEP labs)  No results found for: KPAFRELGTCHN, LAMBDASER, KAPLAMBRATIO (kappa/lambda light chains)  No results found for: HGBA, HGBA2QUANT, HGBFQUANT, HGBSQUAN (Hemoglobinopathy evaluation)   No results found for: LDH  No results found for: IRON, TIBC, IRONPCTSAT (Iron and TIBC)  No results found for: FERRITIN  Urinalysis No results found for: COLORURINE, APPEARANCEUR, LABSPEC, PHURINE, GLUCOSEU, HGBUR, BILIRUBINUR, KETONESUR,  PROTEINUR, UROBILINOGEN, NITRITE, LEUKOCYTESUR   STUDIES: Ir Imaging Guided Port Insertion  Result Date: 03/08/2019 INDICATION: 43 year old female with left-sided breast cancer. She presents for port catheter placement. EXAM: IMPLANTED PORT A CATH PLACEMENT WITH ULTRASOUND AND FLUOROSCOPIC GUIDANCE MEDICATIONS: 2 g Ancef; The antibiotic was administered within an appropriate time interval prior to skin puncture. ANESTHESIA/SEDATION: Versed 4 mg IV; Fentanyl 100 mcg IV; Moderate Sedation Time:  23 minutes The patient was continuously monitored during the procedure by the interventional radiology nurse under my direct supervision. FLUOROSCOPY TIME:  0 minutes, 18 seconds (2 mGy) COMPLICATIONS: None immediate. PROCEDURE: The right neck and chest was prepped with chlorhexidine, and draped in the usual sterile fashion using maximum barrier technique (cap and mask, sterile gown, sterile gloves, large sterile sheet, hand hygiene and cutaneous antiseptic). Local anesthesia was attained by infiltration with 1% lidocaine with epinephrine. Ultrasound demonstrated patency of the right internal jugular vein, and this was documented with an image. Under real-time ultrasound guidance, this vein was accessed with a 21 gauge micropuncture needle and image documentation was performed. A small dermatotomy was made at the access site with an 11 scalpel. A 0.018" wire was advanced into the SVC and the access needle exchanged for a 51F micropuncture vascular sheath. The 0.018" wire was then removed and a 0.035" wire advanced into the IVC. An appropriate location for the subcutaneous reservoir was selected below the clavicle and an incision was made through the skin and underlying soft tissues. The subcutaneous tissues were then dissected using a combination of blunt and sharp surgical technique and a pocket was formed. A single lumen power injectable portacatheter was then tunneled through the subcutaneous tissues from the pocket to  the dermatotomy and the port reservoir placed within the subcutaneous pocket. The venous access site was then serially dilated and a peel away vascular sheath placed over the wire. The wire was removed and the port catheter advanced into position under fluoroscopic guidance. The catheter tip is positioned in the superior cavoatrial junction. This was documented with a spot image. The portacatheter was then tested and found to flush and aspirate well. The port was flushed with saline followed by 100 units/mL heparinized saline. The pocket was then closed in two layers using first subdermal inverted interrupted absorbable sutures followed by a running subcuticular suture. The epidermis was then sealed with Dermabond. The dermatotomy at the venous access site was also closed with Dermabond. IMPRESSION: Successful placement of a right IJ approach Power Port with ultrasound and fluoroscopic guidance. The catheter is ready for use. Electronically Signed   By: Jacqulynn Cadet M.D.   On: 03/08/2019 14:56     ELIGIBLE FOR AVAILABLE RESEARCH PROTOCOL: no  ASSESSMENT: 43 y.o. [place] woman status post left breast biopsy x2 on 12/08/2018, for  multicentric invasive lobular breast cancer, grade 2, estrogen and progesterone receptor positive, HER-2 nonamplified, with an MIB-1 of 3%  (a) bilateral breast MRI 12/20/2018 shows multiple masses throughout the left breast, with nipple retraction, but no evidence of chest wall invasion, no abnormal left axillary lymph nodes, and no findings of concern in the right breast  (b) chest CT scan and bone scan 12/29/2018 show only an incidental goiter  (1) status post left skin sparing mastectomy at [facility] 01/16/2019 for an mpT3 pN1, stage IIA invasive lobular carcinoma, grade 2; repeat prognostic panel again estrogen and progesterone receptor strongly positive, HER-2 nonamplified.  (a) a total of 8 lymph nodes removed, one with macrometastasis (8 mm, no ECE)  (2) adjuvant  chemotherapy will consist of doxorubicin and cyclophosphamide in dose dense fashion x4 starting 03/13/2019, to be followed by weekly paclitaxel x12  (a) echo 03/09/2019 finds normal left ventricular function at 55-60%  (3) adjuvant radiation to follow  (4) antiestrogens to start at the completion of local treatment  (5) genetics testing 01/09/2019 through the [facility] STAT Breast Cancer Panel + Common Hereditary Cancers Panel found no deleterious mutations in ATM, BRCA1, BRCA2, CDH1, CHEK2, PALB2, PTEN, STK11 and TP53, APC, ATM, AXIN2, BARD1, BMPR1A, BRCA1, BRCA2, BRIP1, CDH1, CDKN2A (p14ARF), CDKN2A (p16INK4a), CKD4, CHEK2, CTNNA1, DICER1, EPCAM (Deletion/duplication testing only), GREM1 (promoter region deletion/duplication testing only), KIT, MEN1, MLH1, MSH2, MSH3, MSH6, MUTYH, NBN, NF1, NHTL1, PALB2, PDGFRA, PMS2, POLD1, POLE, PTEN, RAD50, RAD51C, RAD51D SDHB, SDHC, SDHD, SMAD4, SMARCA4. STK11, TP53, TSC1, TSC2, and VHL.  The following genes were evaluated for sequence changes only: SDHA and HOXB13 c.251G>A variant only.   PLAN: Omeka had significant problems from her first cycle of chemotherapy, with nausea, vomiting, and dehydration.  She is also severely neutropenic today.  I am putting her on Cipro 500 mg twice daily and I have asked her to call us if she develops a temperature shaking chills or any other symptoms suggestive of infection  We are changing her nausea medications.  She will take dexamethasone with lorazepam beginning the evening of chemo and continue twice daily for the next 2 days.  She is going to stop the Compazine and substitute metoclopramide which she will take before meals and at bedtime days 2 and 3 from each treatment, and then as needed.  She also has Phenergan suppositories to use as needed.  In addition we are going to give her fluids days to 3 and 5 after her next cycle of chemo to see if we can get ahead of the nausea issue.  She may also use ondansetron up to 3  times daily beginning on day 3 after chemo and continuing thereafter  She knows she is going to lose her hair within the next week or 10 days.  She will call with any other issue that may develop before her next visit.  Hanz Winterhalter, Virgie Dad, MD  03/21/19 9:00 AM Medical Oncology and Hematology [facility] [address] [place], [place] [address] Tel. [phone]    Fax. [phone]  I, Jacqualyn Posey am acting as a Education administrator for Chauncey Cruel, MD.   I, Lurline Del MD, have reviewed the above documentation for accuracy and completeness, and I agree with the above.

## 2019-03-20 NOTE — Telephone Encounter (Signed)
Pt notified of feeling "shaky", H/A, and feeling dehydrated. Pt informed to come in and see Lucianne Lei PA for [facility] today 8/25 around 10am. IVF appt added on 8/26 after nadir check with Dr. Jana Hakim as well. Informed Lucianne Lei of pt symptoms.

## 2019-03-20 NOTE — Patient Instructions (Signed)
Dehydration, Adult  Dehydration is when there is not enough fluid or water in your body. This happens when you lose more fluids than you take in. Dehydration can range from mild to very bad. It should be treated right away to keep it from getting very bad. Symptoms of mild dehydration may include:  Thirst.  Dry lips.  Slightly dry mouth.  Dry, warm skin.  Dizziness. Symptoms of moderate dehydration may include:  Very dry mouth.  Muscle cramps.  Dark pee (urine). Pee may be the color of tea.  Your body making less pee.  Your eyes making fewer tears.  Heartbeat that is uneven or faster than normal (palpitations).  Headache.  Light-headedness, especially when you stand up from sitting.  Fainting (syncope). Symptoms of very bad dehydration may include:  Changes in skin, such as: ? Cold and clammy skin. ? Blotchy (mottled) or pale skin. ? Skin that does not quickly return to normal after being lightly pinched and let go (poor skin turgor).  Changes in body fluids, such as: ? Feeling very thirsty. ? Your eyes making fewer tears. ? Not sweating when body temperature is high, such as in hot weather. ? Your body making very little pee.  Changes in vital signs, such as: ? Weak pulse. ? Pulse that is more than 100 beats a minute when you are sitting still. ? Fast breathing. ? Low blood pressure.  Other changes, such as: ? Sunken eyes. ? Cold hands and feet. ? Confusion. ? Lack of energy (lethargy). ? Trouble waking up from sleep. ? Short-term weight loss. ? Unconsciousness. Follow these instructions at home:   If told by your doctor, drink an ORS: ? Make an ORS by using instructions on the package. ? Start by drinking small amounts, about  cup (120 mL) every 5-10 minutes. ? Slowly drink more until you have had the amount that your doctor said to have.  Drink enough clear fluid to keep your pee clear or pale yellow. If you were told to drink an ORS, finish the  ORS first, then start slowly drinking clear fluids. Drink fluids such as: ? Water. Do not drink only water by itself. Doing that can make the salt (sodium) level in your body get too low (hyponatremia). ? Ice chips. ? Fruit juice that you have added water to (diluted). ? Low-calorie sports drinks.  Avoid: ? Alcohol. ? Drinks that have a lot of sugar. These include high-calorie sports drinks, fruit juice that does not have water added, and soda. ? Caffeine. ? Foods that are greasy or have a lot of fat or sugar.  Take over-the-counter and prescription medicines only as told by your doctor.  Do not take salt tablets. Doing that can make the salt level in your body get too high (hypernatremia).  Eat foods that have minerals (electrolytes). Examples include bananas, oranges, potatoes, tomatoes, and spinach.  Keep all follow-up visits as told by your doctor. This is important. Contact a doctor if:  You have belly (abdominal) pain that: ? Gets worse. ? Stays in one area (localizes).  You have a rash.  You have a stiff neck.  You get angry or annoyed more easily than normal (irritability).  You are more sleepy than normal.  You have a harder time waking up than normal.  You feel: ? Weak. ? Dizzy. ? Very thirsty.  You have peed (urinated) only a small amount of very dark pee during 6-8 hours. Get help right away if:  You have   symptoms of very bad dehydration.  You cannot drink fluids without throwing up (vomiting).  Your symptoms get worse with treatment.  You have a fever.  You have a very bad headache.  You are throwing up or having watery poop (diarrhea) and it: ? Gets worse. ? Does not go away.  You have blood or something green (bile) in your throw-up.  You have blood in your poop (stool). This may cause poop to look black and tarry.  You have not peed in 6-8 hours.  You pass out (faint).  Your heart rate when you are sitting still is more than 100 beats a  minute.  You have trouble breathing. This information is not intended to replace advice given to you by your health care provider. Make sure you discuss any questions you have with your health care provider. Document Released: 05/08/2009 Document Revised: 06/24/2017 Document Reviewed: 09/05/2015 [employer] Patient Education  2020 [employer]  

## 2019-03-21 ENCOUNTER — Inpatient Hospital Stay: Payer: 59

## 2019-03-21 ENCOUNTER — Inpatient Hospital Stay (HOSPITAL_BASED_OUTPATIENT_CLINIC_OR_DEPARTMENT_OTHER): Payer: 59 | Admitting: Oncology

## 2019-03-21 ENCOUNTER — Other Ambulatory Visit: Payer: Self-pay

## 2019-03-21 VITALS — BP 115/71 | HR 90 | Temp 98.3°F | Resp 18 | Ht 64.0 in | Wt 135.6 lb

## 2019-03-21 DIAGNOSIS — C50812 Malignant neoplasm of overlapping sites of left female breast: Secondary | ICD-10-CM

## 2019-03-21 DIAGNOSIS — Z170 Estrogen receptor positive status [ER+]: Secondary | ICD-10-CM

## 2019-03-21 DIAGNOSIS — Z95828 Presence of other vascular implants and grafts: Secondary | ICD-10-CM

## 2019-03-21 DIAGNOSIS — Z5111 Encounter for antineoplastic chemotherapy: Secondary | ICD-10-CM | POA: Diagnosis not present

## 2019-03-21 MED ORDER — LEVOTHYROXINE SODIUM 25 MCG PO TABS: 25.0000 ug | ORAL_TABLET | Freq: Every day | ORAL | 1 refills | Status: DC

## 2019-03-21 MED ORDER — METOCLOPRAMIDE HCL 5 MG PO TABS: 5.0000 mg | ORAL_TABLET | Freq: Three times a day (TID) | ORAL | 3 refills | Status: DC

## 2019-03-21 MED ORDER — PROMETHAZINE HCL 25 MG RE SUPP: 25.0000 mg | Freq: Three times a day (TID) | RECTAL | 0 refills | Status: DC | PRN

## 2019-03-21 MED ORDER — SODIUM CHLORIDE 0.9% FLUSH
10.0000 mL | INTRAVENOUS | Status: DC | PRN
  Filled 2019-03-21: qty 10

## 2019-03-21 MED ORDER — CIPROFLOXACIN HCL 500 MG PO TABS: ORAL_TABLET | ORAL | 1 refills | Status: DC

## 2019-03-21 NOTE — Progress Notes (Signed)
Spoke with Dr Jana Hakim and he stated that the PT didn't need labs today and would not be getting treatment so she wasn't accessed. Sent PT to her appointment.

## 2019-03-21 NOTE — Addendum Note (Signed)
Addended by: Chauncey Cruel on: 03/21/2019 09:12 AM   Modules accepted: Orders

## 2019-03-23 ENCOUNTER — Telehealth: Payer: Self-pay | Admitting: Oncology

## 2019-03-23 NOTE — Telephone Encounter (Signed)
I talk with patient regarding schedule and she cancelled 9/5

## 2019-03-26 NOTE — Progress Notes (Signed)
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 She was last seen here on 03/21/2019.   She continues on adjuvant chemotherapy consiting of doxorubicin and cyclophosphamide x4.  Because of significant problems with dehydration and nausea cycle 1, cycle 2 is being given on day 22 instead of day 15.  We have also added intravenous fluids stage III 4 and 5.  Today is day 1 cycle 2   Since her last visit here, she has not undergone any additional studies.     REVIEW OF SYSTEMS: Tonya Whitehead continues on metoclopramide low-dose 3 times daily which she says helps her slight lingering sense of nausea.  Over the past week however she has felt much better.  She has had excellent exercise, work full-time the whole week, did some yard work and some walking.  Her appetite is better.  She has no taste change.  Her hair started to fill out and her husband went ahead and shaved it off.  She does not have mouth sores but her fillings are very sensitive.  She has had no intercurrent fever and she tolerated the antibiotics without any diarrhea problems.  A detailed review of systems today was otherwise stable   HISTORY OF CURRENT ILLNESS: From the original intake  note:  Tonya Whitehead notes a history of reconstructive left breast surgery in 2002 while living in [place], [place]. That was for symmetry. The left breast accordingly had some scar tissue and she thought that was what she was noting until mid March, when she felt the left breast was changing more and the nipple seemed to be sinking subtly.  She brought this to Dr Dahlia Bailiff attention and underwent bilateral diagnostic mammography with tomography and bilateral breast ultrasonography at [facility] on 12/01/2018 showing: breast density category C; findings concerning for extensive malignancy throughout the left breast predominately involving the upper- and lower-outer quadrants.  The mass was palpable within the upper outer left breast and there was left nipple retraction as well as indentation along the inferior margin of the left breast.  Targeted ultrasound showed three masses measuring 5.6 cm (2 o'clock position), 4.0 cm (close to the nipple and contiguous to the prior mass), and 3.3 cm (6 o'clock position.).  Also noted was a borderline left axillary lymph node adjacent to the left axillary artery (and so not easy to biopsy).  In the right breast was noted a 1.3 cm cyst.  On 12/08/2018 Tonya Whitehead proceeded to biopsy of 2 of the left breast areas in question. The pathology from this procedure ([id number]) showed: invasive mammary carcinoma, grade 2, in two cores (at 2 o'clock and 6 o'clock) with identical histomorphology; e-cadherin is negative, consistent with a lobular phenotype. Prognostic indicators significant for: estrogen receptor, 70% positive and progesterone receptor, 70% positive, both with strong staining intensity. Proliferation marker Ki67 at 3%. HER2 negative by immunohistochemistry (1+).  Note  also the patient had a thyroid ultrasound 07/18/2018 showing bilateral solitary nodules, which were biopsied 08/09/2018.  Both showed follicular nodules, Bethesda category 2 (benign).  The patient's  subsequent history is as detailed below.   PAST MEDICAL HISTORY: Past Medical History:  Diagnosis Date   Breast cancer ([place]) 12/08/2018   invasive lobular   Family history of bladder cancer    Family history of colon cancer    Family history of lung cancer    Family history of ovarian cancer     PAST SURGICAL HISTORY: Past Surgical History:  Procedure Laterality Date   ADENOIDECTOMY     as a child   BREAST EXCISIONAL BIOPSY Left 2002   reconstructive surgery on nipple to match right breast   IR IMAGING GUIDED PORT INSERTION  03/08/2019  Adenoidectomy as a child.   FAMILY HISTORY: Family History  Problem Relation Age of Onset   Lung cancer Mother 110       smoker   Ovarian cancer Mother 82   Colon cancer Maternal Aunt        62s   Bladder Cancer Paternal Grandfather        death was unrelated   Cancer Paternal Aunt        through [facility], blood cancer   As of May 2020 patient's father is alive at age 69. Patient's mother is also living at age 106. She has ovarian cancer, diagnosed in 2019, and lung cancer (stage IV), diagnosed in 2016. She tested negative for BRCA.  A maternal aunt had colon cancer in her 76s.  3 other siblings of the patient's mother have not had any cancer.  On the paternal side the paternal grandmother had bladder cancer.  A paternal great aunt died from cancer of the pancreas.     GYNECOLOGIC HISTORY:  No LMP recorded. Menarche: 43 years old Age at first live birth: 43 years old GX P 3 LMP every month, 5 total days with 2 heavy Contraceptive no, husband with vasectomy. HRT n/a  Hysterectomy? no BSO? 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 Husband [name] is a physical therapist. He is biologically [place] and was adopted by a [place] family.  The patient lives at home with her husband and their 3 children: [name] is 57, [name] is 80, and  [name], 56.  The patient attends a [employer].    ADVANCED DIRECTIVES: Husband [name] is her HCPOA   HEALTH MAINTENANCE: Social History   Tobacco Use   Smoking status: Never Smoker   Smokeless tobacco: Never Used  Substance Use Topics   Alcohol use: Not on file   Drug use: Never     Colonoscopy: never done  PAP: not on file  Bone density: never done   Allergies  Allergen Reactions   Gluten Meal Hives    Joint pain, upset stomach   Sulfa Antibiotics     Unknown reaction " I was told I had a reaction when I was a baby "    Current Outpatient Medications  Medication Sig Dispense Refill   ciprofloxacin (CIPRO) 500 MG tablet Take one tablet twice daily for 5 days starting day 8 after each chemotherapy treatment 40 tablet 1   dexamethasone (DECADRON) 4 MG tablet Take 2 tablets by mouth once a day on the day after chemotherapy and then take 2 tablets two times a day for 2 days. Take with food. 30 tablet 1   levothyroxine (SYNTHROID)  25 MCG tablet Take 1 tablet (25 mcg total) by mouth daily before breakfast. 60 tablet 1   lidocaine-prilocaine (EMLA) cream Apply to affected area once 30 g 3   loratadine (CLARITIN) 10 MG tablet Take 10 mg by mouth daily.     LORazepam (ATIVAN) 0.5 MG tablet Place 1 tablet (0.5 mg total) under the tongue every 6 (six) hours as needed (Nausea). 40 tablet 0   metoCLOPramide (REGLAN) 5 MG tablet Take 1 tablet (5 mg total) by mouth 4 (four) times daily -  before meals and at bedtime. 40 tablet 3   ondansetron (ZOFRAN-ODT) 4 MG disintegrating tablet      polyethylene glycol (MIRALAX / GLYCOLAX) 17 g packet Take 17 g by mouth daily as needed for mild constipation.     prochlorperazine (COMPAZINE) 10 MG tablet Take 1 tablet (10 mg total) by mouth every 6 (six) hours as needed (Nausea or vomiting). 30 tablet 1   promethazine (PHENERGAN) 25 MG suppository Place 1 suppository (25 mg total) rectally every 8 (eight) hours as needed for nausea or vomiting. 12 each 0   promethazine  (PHENERGAN) 25 MG tablet Take 1 tablet (25 mg total) by mouth every 6 (six) hours as needed for nausea or vomiting. 30 tablet 1   No current facility-administered medications for this visit.     OBJECTIVE: Young white woman in no acute distress  Vitals:   03/27/19 0830  BP: (!) 126/91  Pulse: 84  Resp: 18  Temp: 98.5 F (36.9 C)  SpO2: 99%   Wt Readings from Last 3 Encounters:  03/27/19 135 lb 3.2 oz (61.3 kg)  03/21/19 135 lb 9.6 oz (61.5 kg)  03/20/19 135 lb 6.4 oz (61.4 kg)   Body mass index is 23.21 kg/m.    ECOG FS:1 - Symptomatic but completely ambulatory  Ocular: Sclerae unicteric, pupils round and equal Ear-nose-throat: Wearing a mask Lymphatic: No cervical or supraclavicular adenopathy Lungs no rales or rhonchi Heart regular rate and rhythm Abd soft, nontender, positive bowel sounds MSK no focal spinal tenderness, no joint edema Neuro: non-focal, well-oriented, appropriate affect Breasts: The right breast is benign.  The left breast is status post mastectomy.  There is no evidence of chest wall recurrence.  Both axillae are benign  LAB RESULTS:  CMP     Component Value Date/Time   NA 140 03/27/2019 0805   K 4.0 03/27/2019 0805   CL 107 03/27/2019 0805   CO2 24 03/27/2019 0805   GLUCOSE 113 (H) 03/27/2019 0805   BUN 10 03/27/2019 0805   CREATININE 0.74 03/27/2019 0805   CREATININE 0.85 03/20/2019 1022   CALCIUM 9.0 03/27/2019 0805   PROT 6.9 03/27/2019 0805   ALBUMIN 3.9 03/27/2019 0805   AST 16 03/27/2019 0805   AST 13 (L) 03/20/2019 1022   ALT 35 03/27/2019 0805   ALT 40 03/20/2019 1022   ALKPHOS 83 03/27/2019 0805   BILITOT <0.2 (L) 03/27/2019 0805   BILITOT 0.7 03/20/2019 1022   GFRNONAA >60 03/27/2019 0805   GFRNONAA >60 03/20/2019 1022   GFRAA >60 03/27/2019 0805   GFRAA >60 03/20/2019 1022    No results found for: TOTALPROTELP, ALBUMINELP, A1GS, A2GS, BETS, BETA2SER, GAMS, MSPIKE, SPEI  No results found for: KPAFRELGTCHN, LAMBDASER,  KAPLAMBRATIO  Lab Results  Component Value Date   WBC 10.4 03/27/2019   NEUTROABS 7.9 (H) 03/27/2019   HGB 12.1 03/27/2019   HCT 37.2 03/27/2019   MCV 91.2 03/27/2019   PLT 373 03/27/2019    @  LASTCHEMISTRY@  No results found for: LABCA2  No components found for: [id number]  No results for input(s): INR in the last 168 hours.  No results found for: LABCA2  No results found for: NFA213  No results found for: YQM578  No results found for: ION629  No results found for: CA2729  No components found for: HGQUANT  No results found for: CEA1 / No results found for: CEA1   No results found for: AFPTUMOR  No results found for: CHROMOGRNA  No results found for: PSA1  Appointment on 03/27/2019  Component Date Value Ref Range Status   Sodium 03/27/2019 140  135 - 145 mmol/L Final   Potassium 03/27/2019 4.0  3.5 - 5.1 mmol/L Final   Chloride 03/27/2019 107  98 - 111 mmol/L Final   CO2 03/27/2019 24  22 - 32 mmol/L Final   Glucose, Bld 03/27/2019 113* 70 - 99 mg/dL Final   BUN 03/27/2019 10  6 - 20 mg/dL Final   Creatinine, Ser 03/27/2019 0.74  0.44 - 1.00 mg/dL Final   Calcium 03/27/2019 9.0  8.9 - 10.3 mg/dL Final   Total Protein 03/27/2019 6.9  6.5 - 8.1 g/dL Final   Albumin 03/27/2019 3.9  3.5 - 5.0 g/dL Final   AST 03/27/2019 16  15 - 41 U/L Final   ALT 03/27/2019 35  0 - 44 U/L Final   Alkaline Phosphatase 03/27/2019 83  38 - 126 U/L Final   Total Bilirubin 03/27/2019 <0.2* 0.3 - 1.2 mg/dL Final   GFR calc non Af Amer 03/27/2019 >60  >60 mL/min Final   GFR calc Af Amer 03/27/2019 >60  >60 mL/min Final   Anion gap 03/27/2019 9  5 - 15 Final   Performed at [facility], [place] [address] [place], [place] [address]   WBC 03/27/2019 10.4  4.0 - 10.5 K/uL Final   RBC 03/27/2019 4.08  3.87 - 5.11 MIL/uL Final   Hemoglobin 03/27/2019 12.1  12.0 - 15.0 g/dL Final   HCT 03/27/2019 37.2  36.0 - 46.0 % Final   MCV 03/27/2019 91.2   80.0 - 100.0 fL Final   MCH 03/27/2019 29.7  26.0 - 34.0 pg Final   MCHC 03/27/2019 32.5  30.0 - 36.0 g/dL Final   RDW 03/27/2019 13.0  11.5 - 15.5 % Final   Platelets 03/27/2019 373  150 - 400 K/uL Final   nRBC 03/27/2019 0.0  0.0 - 0.2 % Final   Neutrophils Relative % 03/27/2019 75  % Final   Neutro Abs 03/27/2019 7.9* 1.7 - 7.7 K/uL Final   Lymphocytes Relative 03/27/2019 12  % Final   Lymphs Abs 03/27/2019 1.2  0.7 - 4.0 K/uL Final   Monocytes Relative 03/27/2019 6  % Final   Monocytes Absolute 03/27/2019 0.6  0.1 - 1.0 K/uL Final   Eosinophils Relative 03/27/2019 0  % Final   Eosinophils Absolute 03/27/2019 0.0  0.0 - 0.5 K/uL Final   Basophils Relative 03/27/2019 1  % Final   Basophils Absolute 03/27/2019 0.1  0.0 - 0.1 K/uL Final   Immature Granulocytes 03/27/2019 6  % Final   Increased IG's, likely caused by Bone Marrow Colony Stimulating Factor received within 30 days.   Abs Immature Granulocytes 03/27/2019 0.61* 0.00 - 0.07 K/uL Final   Performed at [facility], [place] [address], [place], [place] [address]    (this displays the last labs from the last 3 days)  No results found for: TOTALPROTELP, ALBUMINELP,  A1GS, A2GS, BETS, BETA2SER, GAMS, MSPIKE, SPEI (this displays SPEP labs)  No results found for: KPAFRELGTCHN, LAMBDASER, KAPLAMBRATIO (kappa/lambda light chains)  No results found for: HGBA, HGBA2QUANT, HGBFQUANT, HGBSQUAN (Hemoglobinopathy evaluation)   No results found for: LDH  No results found for: IRON, TIBC, IRONPCTSAT (Iron and TIBC)  No results found for: FERRITIN  Urinalysis No results found for: COLORURINE, APPEARANCEUR, LABSPEC, PHURINE, GLUCOSEU, HGBUR, BILIRUBINUR, KETONESUR, PROTEINUR, UROBILINOGEN, NITRITE, LEUKOCYTESUR   STUDIES: Ir Imaging Guided Port Insertion  Result Date: 03/08/2019 INDICATION: 43 year old female with left-sided breast cancer. She presents for port catheter placement. EXAM:  IMPLANTED PORT A CATH PLACEMENT WITH ULTRASOUND AND FLUOROSCOPIC GUIDANCE MEDICATIONS: 2 g Ancef; The antibiotic was administered within an appropriate time interval prior to skin puncture. ANESTHESIA/SEDATION: Versed 4 mg IV; Fentanyl 100 mcg IV; Moderate Sedation Time:  23 minutes The patient was continuously monitored during the procedure by the interventional radiology nurse under my direct supervision. FLUOROSCOPY TIME:  0 minutes, 18 seconds (2 mGy) COMPLICATIONS: None immediate. PROCEDURE: The right neck and chest was prepped with chlorhexidine, and draped in the usual sterile fashion using maximum barrier technique (cap and mask, sterile gown, sterile gloves, large sterile sheet, hand hygiene and cutaneous antiseptic). Local anesthesia was attained by infiltration with 1% lidocaine with epinephrine. Ultrasound demonstrated patency of the right internal jugular vein, and this was documented with an image. Under real-time ultrasound guidance, this vein was accessed with a 21 gauge micropuncture needle and image documentation was performed. A small dermatotomy was made at the access site with an 11 scalpel. A 0.018" wire was advanced into the SVC and the access needle exchanged for a 88F micropuncture vascular sheath. The 0.018" wire was then removed and a 0.035" wire advanced into the IVC. An appropriate location for the subcutaneous reservoir was selected below the clavicle and an incision was made through the skin and underlying soft tissues. The subcutaneous tissues were then dissected using a combination of blunt and sharp surgical technique and a pocket was formed. A single lumen power injectable portacatheter was then tunneled through the subcutaneous tissues from the pocket to the dermatotomy and the port reservoir placed within the subcutaneous pocket. The venous access site was then serially dilated and a peel away vascular sheath placed over the wire. The wire was removed and the port catheter  advanced into position under fluoroscopic guidance. The catheter tip is positioned in the superior cavoatrial junction. This was documented with a spot image. The portacatheter was then tested and found to flush and aspirate well. The port was flushed with saline followed by 100 units/mL heparinized saline. The pocket was then closed in two layers using first subdermal inverted interrupted absorbable sutures followed by a running subcuticular suture. The epidermis was then sealed with Dermabond. The dermatotomy at the venous access site was also closed with Dermabond. IMPRESSION: Successful placement of a right IJ approach Power Port with ultrasound and fluoroscopic guidance. The catheter is ready for use. Electronically Signed   By: Jacqulynn Cadet M.D.   On: 03/08/2019 14:56     ELIGIBLE FOR AVAILABLE RESEARCH PROTOCOL: no  ASSESSMENT: 43 y.o. [place] woman status post left breast biopsy x2 on 12/08/2018, for multicentric invasive lobular breast cancer, grade 2, estrogen and progesterone receptor positive, HER-2 nonamplified, with an MIB-1 of 3%  (a) bilateral breast MRI 12/20/2018 shows multiple masses throughout the left breast, with nipple retraction, but no evidence of chest wall invasion, no abnormal left axillary lymph nodes, and no findings of concern  in the right breast  (b) chest CT scan and bone scan 12/29/2018 show only an incidental goiter  (1) status post left skin sparing mastectomy at [facility] 01/16/2019 for an mpT3 pN1, stage IIA invasive lobular carcinoma, grade 2; repeat prognostic panel again estrogen and progesterone receptor strongly positive, HER-2 nonamplified.  (a) a total of 8 lymph nodes removed, one with macrometastasis (8 mm, no ECE)  (2) adjuvant chemotherapy will consist of doxorubicin and cyclophosphamide in dose dense fashion x4 starting 03/13/2019, to be followed by weekly paclitaxel x12  (a) echo 03/09/2019 finds normal left ventricular function at 55-60%  (b)  cycle 2 doxorubicin and cyclophosphamide delayed 1 week secondary to nausea problems  (3) adjuvant radiation to follow  (4) antiestrogens to start at the completion of local treatment  (5) genetics testing 01/09/2019 through the [facility] STAT Breast Cancer Panel + Common Hereditary Cancers Panel found no deleterious mutations in ATM, BRCA1, BRCA2, CDH1, CHEK2, PALB2, PTEN, STK11 and TP53, APC, ATM, AXIN2, BARD1, BMPR1A, BRCA1, BRCA2, BRIP1, CDH1, CDKN2A (p14ARF), CDKN2A (p16INK4a), CKD4, CHEK2, CTNNA1, DICER1, EPCAM (Deletion/duplication testing only), GREM1 (promoter region deletion/duplication testing only), KIT, MEN1, MLH1, MSH2, MSH3, MSH6, MUTYH, NBN, NF1, NHTL1, PALB2, PDGFRA, PMS2, POLD1, POLE, PTEN, RAD50, RAD51C, RAD51D SDHB, SDHC, SDHD, SMAD4, SMARCA4. STK11, TP53, TSC1, TSC2, and VHL.  The following genes were evaluated for sequence changes only: SDHA and HOXB13 c.251G>A variant only.   PLAN: Geeta greatly benefited from that extra week and is ready to proceed to cycle 2 today.  Given her prior experience she will start anti-emetics today, and she will come the next 2 days for intravenous fluids and antiemetics as needed.  If she feels she is not in control of things she will let us know and I will pop back in the treatment area to troubleshoot.  Otherwise I am putting her down for an optional visit a week from now.  If she feels everything is fine she will call us and cancel otherwise we will see her on September 8.  Even if we do not see her that day she will start prophylactic antibiotics then.  She will then return to see [place] September 15 and hopefully we can proceed with cycle 3 of her treatment at that time  She knows to call for any other issue that may develop before the next visit.  Devaney Segers, Virgie Dad, MD  03/27/19 9:09 AM Medical Oncology and Hematology [facility] [address] [place], [place] [address] Tel. [phone]    Fax. [phone]  I, Jacqualyn Posey am acting as a Education administrator for Chauncey Cruel, MD.   I, Lurline Del MD, have reviewed the above documentation for accuracy and completeness, and I agree with the above.

## 2019-03-27 ENCOUNTER — Inpatient Hospital Stay: Payer: 59

## 2019-03-27 ENCOUNTER — Inpatient Hospital Stay: Payer: 59 | Attending: Oncology

## 2019-03-27 ENCOUNTER — Encounter: Payer: Self-pay | Admitting: *Deleted

## 2019-03-27 ENCOUNTER — Other Ambulatory Visit: Payer: 59

## 2019-03-27 ENCOUNTER — Other Ambulatory Visit: Payer: Self-pay

## 2019-03-27 ENCOUNTER — Inpatient Hospital Stay (HOSPITAL_BASED_OUTPATIENT_CLINIC_OR_DEPARTMENT_OTHER): Payer: 59 | Admitting: Oncology

## 2019-03-27 VITALS — BP 126/91 | HR 84 | Temp 98.5°F | Resp 18 | Ht 64.0 in | Wt 135.2 lb

## 2019-03-27 DIAGNOSIS — Z5111 Encounter for antineoplastic chemotherapy: Secondary | ICD-10-CM | POA: Diagnosis present

## 2019-03-27 DIAGNOSIS — C50812 Malignant neoplasm of overlapping sites of left female breast: Secondary | ICD-10-CM

## 2019-03-27 DIAGNOSIS — Z90722 Acquired absence of ovaries, bilateral: Secondary | ICD-10-CM | POA: Insufficient documentation

## 2019-03-27 DIAGNOSIS — Z170 Estrogen receptor positive status [ER+]: Secondary | ICD-10-CM | POA: Diagnosis not present

## 2019-03-27 DIAGNOSIS — C50412 Malignant neoplasm of upper-outer quadrant of left female breast: Secondary | ICD-10-CM | POA: Insufficient documentation

## 2019-03-27 DIAGNOSIS — Z79899 Other long term (current) drug therapy: Secondary | ICD-10-CM | POA: Insufficient documentation

## 2019-03-27 DIAGNOSIS — Z9012 Acquired absence of left breast and nipple: Secondary | ICD-10-CM | POA: Diagnosis not present

## 2019-03-27 DIAGNOSIS — Z7689 Persons encountering health services in other specified circumstances: Secondary | ICD-10-CM | POA: Diagnosis not present

## 2019-03-27 LAB — COMPREHENSIVE METABOLIC PANEL
ALT: 35 U/L (ref 0–44)
AST: 16 U/L (ref 15–41)
Albumin: 3.9 g/dL (ref 3.5–5.0)
Alkaline Phosphatase: 83 U/L (ref 38–126)
Anion gap: 9 (ref 5–15)
BUN: 10 mg/dL (ref 6–20)
CO2: 24 mmol/L (ref 22–32)
Calcium: 9 mg/dL (ref 8.9–10.3)
Chloride: 107 mmol/L (ref 98–111)
Creatinine, Ser: 0.74 mg/dL (ref 0.44–1.00)
GFR calc Af Amer: >60 mL/min (ref >60)
GFR calc non Af Amer: >60 mL/min (ref >60)
Glucose, Bld: 113 mg/dL — ABNORMAL HIGH (ref 70–99)
Potassium: 4 mmol/L (ref 3.5–5.1)
Sodium: 140 mmol/L (ref 135–145)
Total Bilirubin: <0.2 mg/dL — ABNORMAL LOW (ref 0.3–1.2)
Total Protein: 6.9 g/dL (ref 6.5–8.1)

## 2019-03-27 LAB — CBC WITH DIFFERENTIAL/PLATELET
Abs Immature Granulocytes: 0.61 10*3/uL — ABNORMAL HIGH (ref 0.00–0.07)
Basophils Absolute: 0.1 10*3/uL (ref 0.0–0.1)
Basophils Relative: 1 %
Eosinophils Absolute: 0 10*3/uL (ref 0.0–0.5)
Eosinophils Relative: 0 %
HCT: 37.2 % (ref 36.0–46.0)
Hemoglobin: 12.1 g/dL (ref 12.0–15.0)
Immature Granulocytes: 6 %
Lymphocytes Relative: 12 %
Lymphs Abs: 1.2 10*3/uL (ref 0.7–4.0)
MCH: 29.7 pg (ref 26.0–34.0)
MCHC: 32.5 g/dL (ref 30.0–36.0)
MCV: 91.2 fL (ref 80.0–100.0)
Monocytes Absolute: 0.6 10*3/uL (ref 0.1–1.0)
Monocytes Relative: 6 %
Neutro Abs: 7.9 10*3/uL — ABNORMAL HIGH (ref 1.7–7.7)
Neutrophils Relative %: 75 %
Platelets: 373 10*3/uL (ref 150–400)
RBC: 4.08 MIL/uL (ref 3.87–5.11)
RDW: 13 % (ref 11.5–15.5)
WBC: 10.4 10*3/uL (ref 4.0–10.5)
nRBC: 0 % (ref 0.0–0.2)

## 2019-03-27 MED ORDER — SODIUM CHLORIDE 0.9% FLUSH
10.0000 mL | INTRAVENOUS | Status: DC | PRN
  Administered 2019-03-27: 10 mL
  Filled 2019-03-27: qty 10

## 2019-03-27 MED ORDER — PEGFILGRASTIM 6 MG/0.6ML ~~LOC~~ PSKT
PREFILLED_SYRINGE | SUBCUTANEOUS | Status: AC
  Filled 2019-03-27: qty 0.6

## 2019-03-27 MED ORDER — HEPARIN SOD (PORK) LOCK FLUSH 100 UNIT/ML IV SOLN
500.0000 [IU] | Freq: Once | INTRAVENOUS | Status: AC | PRN
  Administered 2019-03-27: 500 [IU]
  Filled 2019-03-27: qty 5

## 2019-03-27 MED ORDER — SODIUM CHLORIDE 0.9 % IV SOLN
600.0000 mg/m2 | Freq: Once | INTRAVENOUS | Status: AC
  Administered 2019-03-27: 1040 mg via INTRAVENOUS
  Filled 2019-03-27: qty 52

## 2019-03-27 MED ORDER — PALONOSETRON HCL INJECTION 0.25 MG/5ML
INTRAVENOUS | Status: AC
  Filled 2019-03-27: qty 5

## 2019-03-27 MED ORDER — SODIUM CHLORIDE 0.9 % IV SOLN
Freq: Once | INTRAVENOUS | Status: AC
  Administered 2019-03-27: 10:00:00 via INTRAVENOUS
  Filled 2019-03-27: qty 5

## 2019-03-27 MED ORDER — PEGFILGRASTIM 6 MG/0.6ML ~~LOC~~ PSKT
6.0000 mg | PREFILLED_SYRINGE | Freq: Once | SUBCUTANEOUS | Status: AC
  Administered 2019-03-27: 6 mg via SUBCUTANEOUS

## 2019-03-27 MED ORDER — PALONOSETRON HCL INJECTION 0.25 MG/5ML
0.2500 mg | Freq: Once | INTRAVENOUS | Status: AC
  Administered 2019-03-27: 0.25 mg via INTRAVENOUS

## 2019-03-27 MED ORDER — SODIUM CHLORIDE 0.9 % IV SOLN
Freq: Once | INTRAVENOUS | Status: AC
  Administered 2019-03-27: 09:00:00 via INTRAVENOUS
  Filled 2019-03-27: qty 250

## 2019-03-27 MED ORDER — DOXORUBICIN HCL CHEMO IV INJECTION 2 MG/ML
60.0000 mg/m2 | Freq: Once | INTRAVENOUS | Status: AC
  Administered 2019-03-27: 104 mg via INTRAVENOUS
  Filled 2019-03-27: qty 52

## 2019-03-27 NOTE — Patient Instructions (Signed)

## 2019-03-27 NOTE — Patient Instructions (Signed)
[facility] Discharge Instructions for Patients Receiving Chemotherapy  Today you received the following chemotherapy agents Doxorubicin (ADRIAMYCIN) & Cyclophosphamide (CYTOXAN).  To help prevent nausea and vomiting after your treatment, we encourage you to take your nausea medication as prescribed.   If you develop nausea and vomiting that is not controlled by your nausea medication, call the clinic.   BELOW ARE SYMPTOMS THAT SHOULD BE REPORTED IMMEDIATELY:  *FEVER GREATER THAN 100.5 F  *CHILLS WITH OR WITHOUT FEVER  NAUSEA AND VOMITING THAT IS NOT CONTROLLED WITH YOUR NAUSEA MEDICATION  *UNUSUAL SHORTNESS OF BREATH  *UNUSUAL BRUISING OR BLEEDING  TENDERNESS IN MOUTH AND THROAT WITH OR WITHOUT PRESENCE OF ULCERS  *URINARY PROBLEMS  *BOWEL PROBLEMS  UNUSUAL RASH Items with * indicate a potential emergency and should be followed up as soon as possible.  Feel free to call the clinic should you have any questions or concerns. The clinic phone number is [phone].  Please show the [place] at check-in to the Emergency Department and triage nurse.  Pegfilgrastim injection What is this medicine? PEGFILGRASTIM (PEG fil gra stim) is a long-acting granulocyte colony-stimulating factor that stimulates the growth of neutrophils, a type of white blood cell important in the body's fight against infection. It is used to reduce the incidence of fever and infection in patients with certain types of cancer who are receiving chemotherapy that affects the bone marrow, and to increase survival after being exposed to high doses of radiation. This medicine may be used for other purposes; ask your health care provider or pharmacist if you have questions. COMMON BRAND NAME(S): Steve Rattler, Ziextenzo What should I tell my health care provider before I take this medicine? They need to know if you have any of these conditions:  kidney disease  latex  allergy  ongoing radiation therapy  sickle cell disease  skin reactions to acrylic adhesives (On-Body Injector only)  an unusual or allergic reaction to pegfilgrastim, filgrastim, other medicines, foods, dyes, or preservatives  pregnant or trying to get pregnant  breast-feeding How should I use this medicine? This medicine is for injection under the skin. If you get this medicine at home, you will be taught how to prepare and give the pre-filled syringe or how to use the On-body Injector. Refer to the patient Instructions for Use for detailed instructions. Use exactly as directed. Tell your healthcare provider immediately if you suspect that the On-body Injector may not have performed as intended or if you suspect the use of the On-body Injector resulted in a missed or partial dose. It is important that you put your used needles and syringes in a special sharps container. Do not put them in a trash can. If you do not have a sharps container, call your pharmacist or healthcare provider to get one. Talk to your pediatrician regarding the use of this medicine in children. While this drug may be prescribed for selected conditions, precautions do apply. Overdosage: If you think you have taken too much of this medicine contact a poison control center or emergency room at once. NOTE: This medicine is only for you. Do not share this medicine with others. What if I miss a dose? It is important not to miss your dose. Call your doctor or health care professional if you miss your dose. If you miss a dose due to an On-body Injector failure or leakage, a new dose should be administered as soon as possible using a single prefilled syringe for manual  use. What may interact with this medicine? Interactions have not been studied. Give your health care provider a list of all the medicines, herbs, non-prescription drugs, or dietary supplements you use. Also tell them if you smoke, drink alcohol, or use illegal  drugs. Some items may interact with your medicine. This list may not describe all possible interactions. Give your health care provider a list of all the medicines, herbs, non-prescription drugs, or dietary supplements you use. Also tell them if you smoke, drink alcohol, or use illegal drugs. Some items may interact with your medicine. What should I watch for while using this medicine? You may need blood work done while you are taking this medicine. If you are going to need a MRI, CT scan, or other procedure, tell your doctor that you are using this medicine (On-Body Injector only). What side effects may I notice from receiving this medicine? Side effects that you should report to your doctor or health care professional as soon as possible:  allergic reactions like skin rash, itching or hives, swelling of the face, lips, or tongue  back pain  dizziness  fever  pain, redness, or irritation at site where injected  pinpoint red spots on the skin  red or dark-brown urine  shortness of breath or breathing problems  stomach or side pain, or pain at the shoulder  swelling  tiredness  trouble passing urine or change in the amount of urine Side effects that usually do not require medical attention (report to your doctor or health care professional if they continue or are bothersome):  bone pain  muscle pain This list may not describe all possible side effects. Call your doctor for medical advice about side effects. You may report side effects to FDA at 1-800-FDA-1088. Where should I keep my medicine? Keep out of the reach of children. If you are using this medicine at home, you will be instructed on how to store it. Throw away any unused medicine after the expiration date on the label. NOTE: This sheet is a summary. It may not cover all possible information. If you have questions about this medicine, talk to your doctor, pharmacist, or health care provider.  2020 Elsevier/Gold  Standard (2017-10-17 16:57:08)  Coronavirus (COVID-19) Are you at risk?  Are you at risk for the Coronavirus (COVID-19)?  To be considered HIGH RISK for Coronavirus (COVID-19), you have to meet the following criteria:  . Traveled to [place], [place], [place], [place] or [place]; or in the [place] to [place], [place], [place], or [place]; and have fever, cough, and shortness of breath within the last 2 weeks of travel OR . Been in close contact with a person diagnosed with COVID-19 within the last 2 weeks and have fever, cough, and shortness of breath . IF YOU DO NOT MEET THESE CRITERIA, YOU ARE CONSIDERED LOW RISK FOR COVID-19.  What to do if you are HIGH RISK for COVID-19?  . If you are having a medical emergency, call 911. . Seek medical care right away. Before you go to a doctor's office, urgent care or emergency department, call ahead and tell them about your recent travel, contact with someone diagnosed with COVID-19, and your symptoms. You should receive instructions from your physician's office regarding next steps of care.  . When you arrive at healthcare provider, tell the healthcare staff immediately you have returned from visiting [place], [place], [place], [place] or [place]; or traveled in the [place] to [place], [place], [place],   or [place]; in the last two weeks or you have been in close contact with a person diagnosed with COVID-19 in the last 2 weeks.   . Tell the health care staff about your symptoms: fever, cough and shortness of breath. . After you have been seen by a medical provider, you will be either: o Tested for (COVID-19) and discharged home on quarantine except to seek medical care if symptoms worsen, and asked to  - Stay home and avoid contact with others until you get your results (4-5 days)  - Avoid travel on public transportation if possible (such as bus, train, or airplane) or o Sent to the Emergency Department by EMS for evaluation,  COVID-19 testing, and possible admission depending on your condition and test results.  What to do if you are LOW RISK for COVID-19?  Reduce your risk of any infection by using the same precautions used for avoiding the common cold or flu:  . Wash your hands often with soap and warm water for at least 20 seconds.  If soap and water are not readily available, use an alcohol-based hand sanitizer with at least 60% alcohol.  . If coughing or sneezing, cover your mouth and nose by coughing or sneezing into the elbow areas of your shirt or coat, into a tissue or into your sleeve (not your hands). . Avoid shaking hands with others and consider head nods or verbal greetings only. . Avoid touching your eyes, nose, or mouth with unwashed hands.  . Avoid close contact with people who are sick. . Avoid places or events with large numbers of people in one location, like concerts or sporting events. . Carefully consider travel plans you have or are making. . If you are planning any travel outside or inside the [place], visit the CDC's Travelers' Health webpage for the latest health notices. . If you have some symptoms but not all symptoms, continue to monitor at home and seek medical attention if your symptoms worsen. . If you are having a medical emergency, call 911.   ADDITIONAL HEALTHCARE OPTIONS FOR PATIENTS  [facility] / e-Visit: [email or web address]         [facility]: [phone]  [facility]: [phone]                   [facility]: [phone]    

## 2019-03-28 ENCOUNTER — Inpatient Hospital Stay: Payer: 59

## 2019-03-28 ENCOUNTER — Other Ambulatory Visit: Payer: Self-pay

## 2019-03-28 ENCOUNTER — Telehealth: Payer: Self-pay | Admitting: Adult Health

## 2019-03-28 VITALS — BP 92/69 | HR 88 | Temp 98.5°F | Resp 18

## 2019-03-28 DIAGNOSIS — Z170 Estrogen receptor positive status [ER+]: Secondary | ICD-10-CM

## 2019-03-28 DIAGNOSIS — Z5111 Encounter for antineoplastic chemotherapy: Secondary | ICD-10-CM | POA: Diagnosis not present

## 2019-03-28 DIAGNOSIS — C50812 Malignant neoplasm of overlapping sites of left female breast: Secondary | ICD-10-CM

## 2019-03-28 MED ORDER — SODIUM CHLORIDE 0.9 % IV SOLN
Freq: Once | INTRAVENOUS | Status: AC
  Administered 2019-03-28: 09:00:00 via INTRAVENOUS
  Filled 2019-03-28: qty 250

## 2019-03-28 MED ORDER — ONDANSETRON HCL 4 MG/2ML IJ SOLN
4.0000 mg | Freq: Once | INTRAMUSCULAR | Status: AC
  Administered 2019-03-28: 4 mg via INTRAVENOUS

## 2019-03-28 MED ORDER — SODIUM CHLORIDE 0.9% FLUSH
10.0000 mL | Freq: Once | INTRAVENOUS | Status: AC | PRN
  Administered 2019-03-28: 10 mL
  Filled 2019-03-28: qty 10

## 2019-03-28 MED ORDER — ONDANSETRON HCL 4 MG/2ML IJ SOLN
INTRAMUSCULAR | Status: AC
  Filled 2019-03-28: qty 2

## 2019-03-28 MED ORDER — HEPARIN SOD (PORK) LOCK FLUSH 100 UNIT/ML IV SOLN
500.0000 [IU] | Freq: Once | INTRAVENOUS | Status: AC | PRN
  Administered 2019-03-28: 500 [IU]
  Filled 2019-03-28: qty 5

## 2019-03-28 NOTE — Telephone Encounter (Signed)
Scheduled appt per 9/2 sch message - pt aware of new appt

## 2019-03-28 NOTE — Patient Instructions (Signed)

## 2019-03-28 NOTE — Progress Notes (Signed)
Pt having slight nausea this morning, compazine order in treatment plan. Pt took phenergan suppository, Reglan, & Decadron at home this morning.    Informed Dr. Jana Hakim of meds pt has taken this a.m., zofran order received.  Compazine not to be given.

## 2019-03-29 ENCOUNTER — Inpatient Hospital Stay: Payer: 59

## 2019-03-30 ENCOUNTER — Inpatient Hospital Stay: Payer: 59

## 2019-03-30 ENCOUNTER — Other Ambulatory Visit: Payer: Self-pay

## 2019-03-30 ENCOUNTER — Other Ambulatory Visit: Payer: Self-pay | Admitting: Medical

## 2019-03-30 VITALS — BP 99/74 | HR 68 | Temp 98.7°F | Resp 18

## 2019-03-30 DIAGNOSIS — Z170 Estrogen receptor positive status [ER+]: Secondary | ICD-10-CM

## 2019-03-30 DIAGNOSIS — Z5111 Encounter for antineoplastic chemotherapy: Secondary | ICD-10-CM | POA: Diagnosis not present

## 2019-03-30 DIAGNOSIS — C50812 Malignant neoplasm of overlapping sites of left female breast: Secondary | ICD-10-CM

## 2019-03-30 MED ORDER — HEPARIN SOD (PORK) LOCK FLUSH 100 UNIT/ML IV SOLN
500.0000 [IU] | Freq: Once | INTRAVENOUS | Status: DC | PRN
  Filled 2019-03-30: qty 5

## 2019-03-30 MED ORDER — ONDANSETRON HCL 4 MG/2ML IJ SOLN
INTRAMUSCULAR | Status: AC
  Filled 2019-03-30: qty 2

## 2019-03-30 MED ORDER — SODIUM CHLORIDE 0.9% FLUSH
10.0000 mL | Freq: Once | INTRAVENOUS | Status: DC | PRN
  Filled 2019-03-30: qty 10

## 2019-03-30 MED ORDER — SODIUM CHLORIDE 0.9 % IV SOLN
INTRAVENOUS | Status: DC
  Administered 2019-03-30: 09:00:00 via INTRAVENOUS
  Filled 2019-03-30 (×2): qty 250

## 2019-03-30 MED ORDER — ONDANSETRON HCL 4 MG/2ML IJ SOLN
4.0000 mg | Freq: Once | INTRAMUSCULAR | Status: AC
  Administered 2019-03-30: 4 mg via INTRAVENOUS

## 2019-03-31 ENCOUNTER — Ambulatory Visit: Payer: 59

## 2019-04-03 ENCOUNTER — Telehealth: Payer: Self-pay

## 2019-04-03 ENCOUNTER — Inpatient Hospital Stay: Payer: 59

## 2019-04-03 ENCOUNTER — Inpatient Hospital Stay: Payer: 59 | Admitting: Adult Health

## 2019-04-03 ENCOUNTER — Telehealth: Payer: Self-pay | Admitting: *Deleted

## 2019-04-03 NOTE — Telephone Encounter (Signed)
Attempted to call patient about missed appt but went to voice mail. Nurse left a message for her to call us to reschedule.

## 2019-04-03 NOTE — Telephone Encounter (Signed)
Pt return email. Relate she no longer has fever and started taking abx as instructed after her 1st AC from Dr. Jana Hakim. Scheduled and confirmed new nadir check for 9/9 at 1100.

## 2019-04-03 NOTE — Telephone Encounter (Signed)
Pt's husband called on call service over the weekend reporting fever, chills and body aches.  Pt was instructed to go to the ED. RN called to check on pt.  Received vm.  LVM on pt's number and spouses number for a return call to update [place] on how she is doing.

## 2019-04-03 NOTE — Telephone Encounter (Signed)
Left vm for pt to return call regarding missed appt on 9/8 for nadir check.  Pt did send email that she was instructed to go to the ED for fever after [place] physician[place]. Pt informed by time she returned home that the fever was in the low 100s.

## 2019-04-04 ENCOUNTER — Other Ambulatory Visit: Payer: Self-pay

## 2019-04-04 ENCOUNTER — Inpatient Hospital Stay: Payer: 59

## 2019-04-04 ENCOUNTER — Encounter: Payer: Self-pay | Admitting: Adult Health

## 2019-04-04 ENCOUNTER — Inpatient Hospital Stay (HOSPITAL_BASED_OUTPATIENT_CLINIC_OR_DEPARTMENT_OTHER): Payer: 59 | Admitting: Adult Health

## 2019-04-04 VITALS — BP 117/58 | HR 101 | Temp 98.3°F | Resp 17 | Ht 64.0 in | Wt 135.4 lb

## 2019-04-04 DIAGNOSIS — Z170 Estrogen receptor positive status [ER+]: Secondary | ICD-10-CM | POA: Diagnosis not present

## 2019-04-04 DIAGNOSIS — C50812 Malignant neoplasm of overlapping sites of left female breast: Secondary | ICD-10-CM | POA: Diagnosis not present

## 2019-04-04 DIAGNOSIS — Z5111 Encounter for antineoplastic chemotherapy: Secondary | ICD-10-CM | POA: Diagnosis not present

## 2019-04-04 LAB — CBC WITH DIFFERENTIAL/PLATELET
Abs Immature Granulocytes: 0.21 10*3/uL — ABNORMAL HIGH (ref 0.00–0.07)
Basophils Absolute: 0.1 10*3/uL (ref 0.0–0.1)
Basophils Relative: 3 %
Eosinophils Absolute: 0 10*3/uL (ref 0.0–0.5)
Eosinophils Relative: 1 %
HCT: 37.9 % (ref 36.0–46.0)
Hemoglobin: 12.2 g/dL (ref 12.0–15.0)
Immature Granulocytes: 11 %
Lymphocytes Relative: 30 %
Lymphs Abs: 0.6 10*3/uL — ABNORMAL LOW (ref 0.7–4.0)
MCH: 29.7 pg (ref 26.0–34.0)
MCHC: 32.2 g/dL (ref 30.0–36.0)
MCV: 92.2 fL (ref 80.0–100.0)
Monocytes Absolute: 0.5 10*3/uL (ref 0.1–1.0)
Monocytes Relative: 25 %
Neutro Abs: 0.6 10*3/uL — ABNORMAL LOW (ref 1.7–7.7)
Neutrophils Relative %: 30 %
Platelets: 172 10*3/uL (ref 150–400)
RBC: 4.11 MIL/uL (ref 3.87–5.11)
RDW: 13.3 % (ref 11.5–15.5)
WBC: 2 10*3/uL — ABNORMAL LOW (ref 4.0–10.5)
nRBC: 0 % (ref 0.0–0.2)

## 2019-04-04 LAB — COMPREHENSIVE METABOLIC PANEL
ALT: 141 U/L — ABNORMAL HIGH (ref 0–44)
AST: 28 U/L (ref 15–41)
Albumin: 3.8 g/dL (ref 3.5–5.0)
Alkaline Phosphatase: 103 U/L (ref 38–126)
Anion gap: 9 (ref 5–15)
BUN: 8 mg/dL (ref 6–20)
CO2: 29 mmol/L (ref 22–32)
Calcium: 9.4 mg/dL (ref 8.9–10.3)
Chloride: 100 mmol/L (ref 98–111)
Creatinine, Ser: 0.77 mg/dL (ref 0.44–1.00)
GFR calc Af Amer: >60 mL/min (ref >60)
GFR calc non Af Amer: >60 mL/min (ref >60)
Glucose, Bld: 105 mg/dL — ABNORMAL HIGH (ref 70–99)
Potassium: 3.8 mmol/L (ref 3.5–5.1)
Sodium: 138 mmol/L (ref 135–145)
Total Bilirubin: 0.2 mg/dL — ABNORMAL LOW (ref 0.3–1.2)
Total Protein: 7.1 g/dL (ref 6.5–8.1)

## 2019-04-04 NOTE — Progress Notes (Signed)
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 She was last seen here on 03/21/2019.   She continues on adjuvant chemotherapy consiting of doxorubicin and cyclophosphamide x4.  Because of significant problems with dehydration and nausea cycle 1, cycle 2 is being given on day 22 instead of day 15. She received extra IV fluids, and did much better with tolerating it.  The anti nausea medication change was also helpful.  She did not vomit.     REVIEW OF SYSTEMS: Tonya Whitehead had a fever over the weekend.  She was recommended to go the ER, however was traveling at [employer] and decided against going.  She started Cipro on Monday and is tolerating it well.  Her last fever was on Sunday at it was from 6pm to midnight.  Her temperature has been normal since Monday morning waking up.  She had no associated symptoms with the fever.    Tonya Whitehead has started feeling well.  She has not felt as dehydrated with this cycle.  She denies bowel/bladder changes.  She has no cough, shortness of breath, chest pain, palpitations, skin lesions, or any other potential infection sites.  A detailed ROS was otherwise non contributory.     HISTORY OF CURRENT ILLNESS: From the original  intake note:  Tonya Whitehead notes a history of reconstructive left breast surgery in 2002 while living in [place], [place]. That was for symmetry. The left breast accordingly had some scar tissue and she thought that was what she was noting until mid March, when she felt the left breast was changing more and the nipple seemed to be sinking subtly.  She brought this to Dr Dahlia Bailiff attention and underwent bilateral diagnostic mammography with tomography and bilateral breast ultrasonography at [facility] on 12/01/2018 showing: breast density category C; findings concerning for extensive malignancy throughout the left breast predominately involving the upper- and lower-outer quadrants.  The mass was palpable within the upper outer left breast and there was left nipple retraction as well as indentation along the inferior margin of the left breast.  Targeted ultrasound showed three masses measuring 5.6 cm (2 o'clock position), 4.0 cm (close to the nipple and contiguous to the prior mass), and 3.3 cm (6 o'clock position.).  Also noted was a borderline left axillary lymph node adjacent to the left axillary artery (and so not easy to biopsy).  In the right breast was noted a 1.3 cm cyst.  On 12/08/2018 Danijah proceeded to biopsy of 2 of the left breast areas in question. The pathology from this procedure ([id number]) showed: invasive mammary carcinoma, grade 2, in two cores (at 2 o'clock and 6 o'clock) with identical histomorphology; e-cadherin is negative, consistent with a lobular phenotype. Prognostic indicators significant for: estrogen receptor, 70% positive and progesterone receptor, 70% positive, both with strong staining intensity. Proliferation marker Ki67 at 3%. HER2 negative by immunohistochemistry (1+).  Note also the patient had a thyroid ultrasound 07/18/2018 showing bilateral solitary nodules, which were biopsied 08/09/2018.  Both showed follicular nodules, Bethesda category 2 (benign).  The  patient's subsequent history is as detailed below.   PAST MEDICAL HISTORY: Past Medical History:  Diagnosis Date   Breast cancer ([facility] [place]) 12/08/2018   invasive lobular   Family history of bladder cancer    Family history of colon cancer    Family history of lung cancer    Family history of ovarian cancer     PAST SURGICAL HISTORY: Past Surgical History:  Procedure Laterality Date   ADENOIDECTOMY     as a child   BREAST EXCISIONAL BIOPSY Left 2002   reconstructive surgery on nipple to match right breast   IR IMAGING GUIDED PORT INSERTION  03/08/2019  Adenoidectomy as a child.   FAMILY HISTORY: Family History  Problem Relation Age of Onset   Lung cancer Mother 84       smoker   Ovarian cancer Mother 29   Colon cancer Maternal Aunt        69s   Bladder Cancer Paternal Grandfather        death was unrelated   Cancer Paternal Aunt        through [facility], blood cancer   As of May 2020 patient's father is alive at age 44. Patient's mother is also living at age 36. She has ovarian cancer, diagnosed in 2019, and lung cancer (stage IV), diagnosed in 2016. She tested negative for BRCA.  A maternal aunt had colon cancer in her 56s.  3 other siblings of the patient's mother have not had any cancer.  On the paternal side the paternal grandmother had bladder cancer.  A paternal great aunt died from cancer of the pancreas.     GYNECOLOGIC HISTORY:  No LMP recorded. Menarche: 43 years old Age at first live birth: 43 years old GX P 3 LMP every month, 5 total days with 2 heavy Contraceptive no, husband with vasectomy. HRT n/a  Hysterectomy? no BSO? 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 Husband [name] is a physical therapist. He is biologically [place] and was adopted by a [place] family.  The patient lives at home with her husband and their 3 children: [name] is 63, [name] is 73, and  [name], 54.  The patient attends a [employer].    ADVANCED DIRECTIVES: Husband [name] is her HCPOA   HEALTH MAINTENANCE: Social History   Tobacco Use   Smoking status: Never Smoker   Smokeless tobacco: Never Used  Substance Use Topics   Alcohol use: Not on file   Drug use: Never     Colonoscopy: never done  PAP: not on file  Bone density: never done   Allergies  Allergen Reactions   Gluten Meal Hives    Joint pain, upset stomach   Sulfa Antibiotics     Unknown reaction " I was told I had a reaction when I was a baby "    Current Outpatient Medications  Medication Sig Dispense Refill   ciprofloxacin (CIPRO) 500 MG tablet Take one tablet twice daily for 5 days starting day 8 after each chemotherapy treatment 40 tablet 1   dexamethasone (DECADRON) 4 MG tablet Take 2 tablets by mouth once a day on the day after chemotherapy and then take 2 tablets two times a day for 2 days. Take with food. 30 tablet 1   levothyroxine (  SYNTHROID) 25 MCG tablet Take 1 tablet (25 mcg total) by mouth daily before breakfast. 60 tablet 1   lidocaine-prilocaine (EMLA) cream Apply to affected area once 30 g 3   loratadine (CLARITIN) 10 MG tablet Take 10 mg by mouth daily.     LORazepam (ATIVAN) 0.5 MG tablet Place 1 tablet (0.5 mg total) under the tongue every 6 (six) hours as needed (Nausea). 40 tablet 0   metoCLOPramide (REGLAN) 5 MG tablet Take 1 tablet (5 mg total) by mouth 4 (four) times daily -  before meals and at bedtime. 40 tablet 3   ondansetron (ZOFRAN-ODT) 4 MG disintegrating tablet      polyethylene glycol (MIRALAX / GLYCOLAX) 17 g packet Take 17 g by mouth daily as needed for mild constipation.     prochlorperazine (COMPAZINE) 10 MG tablet Take 1 tablet (10 mg total) by mouth every 6 (six) hours as needed (Nausea or vomiting). 30 tablet 1   promethazine (PHENERGAN) 25 MG suppository Place 1 suppository (25 mg total) rectally every 8 (eight) hours as needed for nausea or vomiting. 12 each 0    promethazine (PHENERGAN) 25 MG tablet Take 1 tablet (25 mg total) by mouth every 6 (six) hours as needed for nausea or vomiting. 30 tablet 1   No current facility-administered medications for this visit.     OBJECTIVE:   Vitals:   04/04/19 1138  BP: (!) 117/58  Pulse: (!) 101  Resp: 17  Temp: 98.3 F (36.8 C)  SpO2: 100%   Wt Readings from Last 3 Encounters:  04/04/19 135 lb 6.4 oz (61.4 kg)  03/27/19 135 lb 3.2 oz (61.3 kg)  03/21/19 135 lb 9.6 oz (61.5 kg)   Body mass index is 23.24 kg/m.   ECOG FS:1 - Symptomatic but completely ambulatory GENERAL: Patient is a well appearing female in no acute distress HEENT:  Sclerae anicteric.  Oropharynx clear and moist. No ulcerations or evidence of oropharyngeal candidiasis. Neck is supple.  NODES:  No cervical, supraclavicular, or axillary lymphadenopathy palpated.  BREAST EXAM:  Deferred. LUNGS:  Clear to auscultation bilaterally.  No wheezes or rhonchi. HEART:  Regular rate and rhythm. No murmur appreciated. ABDOMEN:  Soft, nontender.  Positive, normoactive bowel sounds. No organomegaly palpated. MSK:  No focal spinal tenderness to palpation. Full range of motion bilaterally in the upper extremities. EXTREMITIES:  No peripheral edema.   SKIN:  Clear with no obvious rashes or skin changes. No nail dyscrasia. NEURO:  Nonfocal. Well oriented.  Appropriate affect.   LAB RESULTS:  CMP     Component Value Date/Time   NA 138 04/04/2019 1120   K 3.8 04/04/2019 1120   CL 100 04/04/2019 1120   CO2 29 04/04/2019 1120   GLUCOSE 105 (H) 04/04/2019 1120   BUN 8 04/04/2019 1120   CREATININE 0.77 04/04/2019 1120   CREATININE 0.85 03/20/2019 1022   CALCIUM 9.4 04/04/2019 1120   PROT 7.1 04/04/2019 1120   ALBUMIN 3.8 04/04/2019 1120   AST 28 04/04/2019 1120   AST 13 (L) 03/20/2019 1022   ALT 141 (H) 04/04/2019 1120   ALT 40 03/20/2019 1022   ALKPHOS 103 04/04/2019 1120   BILITOT 0.2 (L) 04/04/2019 1120   BILITOT 0.7 03/20/2019 1022    GFRNONAA >60 04/04/2019 1120   GFRNONAA >60 03/20/2019 1022   GFRAA >60 04/04/2019 1120   GFRAA >60 03/20/2019 1022    No results found for: TOTALPROTELP, ALBUMINELP, A1GS, A2GS, BETS, BETA2SER, GAMS, MSPIKE, SPEI  No results found  for: KPAFRELGTCHN, LAMBDASER, [facility]  Lab Results  Component Value Date   WBC 2.0 (L) 04/04/2019   NEUTROABS PENDING 04/04/2019   HGB 12.2 04/04/2019   HCT 37.9 04/04/2019   MCV 92.2 04/04/2019   PLT 172 04/04/2019    '@LASTCHEMISTRY' @  No results found for: LABCA2  No components found for: HDQQIW979  No results for input(s): INR in the last 168 hours.  No results found for: LABCA2  No results found for: GXQ119  No results found for: ERD408  No results found for: XKG818  No results found for: CA2729  No components found for: HGQUANT  No results found for: CEA1 / No results found for: CEA1   No results found for: AFPTUMOR  No results found for: CHROMOGRNA  No results found for: PSA1  Appointment on 04/04/2019  Component Date Value Ref Range Status   Sodium 04/04/2019 138  135 - 145 mmol/L Final   Potassium 04/04/2019 3.8  3.5 - 5.1 mmol/L Final   Chloride 04/04/2019 100  98 - 111 mmol/L Final   CO2 04/04/2019 29  22 - 32 mmol/L Final   Glucose, Bld 04/04/2019 105* 70 - 99 mg/dL Final   BUN 04/04/2019 8  6 - 20 mg/dL Final   Creatinine, Ser 04/04/2019 0.77  0.44 - 1.00 mg/dL Final   Calcium 04/04/2019 9.4  8.9 - 10.3 mg/dL Final   Total Protein 04/04/2019 7.1  6.5 - 8.1 g/dL Final   Albumin 04/04/2019 3.8  3.5 - 5.0 g/dL Final   AST 04/04/2019 28  15 - 41 U/L Final   ALT 04/04/2019 141* 0 - 44 U/L Final   Alkaline Phosphatase 04/04/2019 103  38 - 126 U/L Final   Total Bilirubin 04/04/2019 0.2* 0.3 - 1.2 mg/dL Final   GFR calc non Af Amer 04/04/2019 >60  >60 mL/min Final   GFR calc Af Amer 04/04/2019 >60  >60 mL/min Final   Anion gap 04/04/2019 9  5 - 15 Final   Performed at [facility], [place] [address] [place], [place] [address]   WBC 04/04/2019 2.0* 4.0 - 10.5 K/uL Final   RBC 04/04/2019 4.11  3.87 - 5.11 MIL/uL Final   Hemoglobin 04/04/2019 12.2  12.0 - 15.0 g/dL Final   HCT 04/04/2019 37.9  36.0 - 46.0 % Final   MCV 04/04/2019 92.2  80.0 - 100.0 fL Final   MCH 04/04/2019 29.7  26.0 - 34.0 pg Final   MCHC 04/04/2019 32.2  30.0 - 36.0 g/dL Final   RDW 04/04/2019 13.3  11.5 - 15.5 % Final   Platelets 04/04/2019 172  150 - 400 K/uL Final   nRBC 04/04/2019 0.0  0.0 - 0.2 % Final   Performed at [facility], [place] [address] [place], [place] [address]   Neutrophils Relative % 04/04/2019 PENDING  % Incomplete   Neutro Abs 04/04/2019 PENDING  1.7 - 7.7 K/uL Incomplete   Band Neutrophils 04/04/2019 PENDING  % Incomplete   Lymphocytes Relative 04/04/2019 PENDING  % Incomplete   Lymphs Abs 04/04/2019 PENDING  0.7 - 4.0 K/uL Incomplete   Monocytes Relative 04/04/2019 PENDING  % Incomplete   Monocytes Absolute 04/04/2019 PENDING  0.1 - 1.0 K/uL Incomplete   Eosinophils Relative 04/04/2019 PENDING  % Incomplete   Eosinophils Absolute 04/04/2019 PENDING  0.0 - 0.5 K/uL Incomplete   Basophils Relative 04/04/2019 PENDING  % Incomplete   Basophils Absolute 04/04/2019 PENDING  0.0 - 0.1 K/uL Incomplete   WBC Morphology  04/04/2019 PENDING   Incomplete   RBC Morphology 04/04/2019 PENDING   Incomplete   Smear Review 04/04/2019 PENDING   Incomplete   Other 04/04/2019 PENDING  % Incomplete   nRBC 04/04/2019 PENDING  0 /100 WBC Incomplete   Metamyelocytes Relative 04/04/2019 PENDING  % Incomplete   Myelocytes 04/04/2019 PENDING  % Incomplete   Promyelocytes Relative 04/04/2019 PENDING  % Incomplete   Blasts 04/04/2019 PENDING  % Incomplete    (this displays the last labs from the last 3 days)  No results found for: TOTALPROTELP, ALBUMINELP, A1GS, A2GS, BETS, BETA2SER, GAMS, MSPIKE, SPEI (this displays SPEP labs)  No  results found for: KPAFRELGTCHN, LAMBDASER, KAPLAMBRATIO (kappa/lambda light chains)  No results found for: HGBA, HGBA2QUANT, HGBFQUANT, HGBSQUAN (Hemoglobinopathy evaluation)   No results found for: LDH  No results found for: IRON, TIBC, IRONPCTSAT (Iron and TIBC)  No results found for: FERRITIN  Urinalysis No results found for: COLORURINE, APPEARANCEUR, LABSPEC, PHURINE, GLUCOSEU, HGBUR, BILIRUBINUR, KETONESUR, PROTEINUR, UROBILINOGEN, NITRITE, LEUKOCYTESUR   STUDIES: Ir Imaging Guided Port Insertion  Result Date: 03/08/2019 INDICATION: 43 year old female with left-sided breast cancer. She presents for port catheter placement. EXAM: IMPLANTED PORT A CATH PLACEMENT WITH ULTRASOUND AND FLUOROSCOPIC GUIDANCE MEDICATIONS: 2 g Ancef; The antibiotic was administered within an appropriate time interval prior to skin puncture. ANESTHESIA/SEDATION: Versed 4 mg IV; Fentanyl 100 mcg IV; Moderate Sedation Time:  23 minutes The patient was continuously monitored during the procedure by the interventional radiology nurse under my direct supervision. FLUOROSCOPY TIME:  0 minutes, 18 seconds (2 mGy) COMPLICATIONS: None immediate. PROCEDURE: The right neck and chest was prepped with chlorhexidine, and draped in the usual sterile fashion using maximum barrier technique (cap and mask, sterile gown, sterile gloves, large sterile sheet, hand hygiene and cutaneous antiseptic). Local anesthesia was attained by infiltration with 1% lidocaine with epinephrine. Ultrasound demonstrated patency of the right internal jugular vein, and this was documented with an image. Under real-time ultrasound guidance, this vein was accessed with a 21 gauge micropuncture needle and image documentation was performed. A small dermatotomy was made at the access site with an 11 scalpel. A 0.018" wire was advanced into the SVC and the access needle exchanged for a 95F micropuncture vascular sheath. The 0.018" wire was then removed and a  0.035" wire advanced into the IVC. An appropriate location for the subcutaneous reservoir was selected below the clavicle and an incision was made through the skin and underlying soft tissues. The subcutaneous tissues were then dissected using a combination of blunt and sharp surgical technique and a pocket was formed. A single lumen power injectable portacatheter was then tunneled through the subcutaneous tissues from the pocket to the dermatotomy and the port reservoir placed within the subcutaneous pocket. The venous access site was then serially dilated and a peel away vascular sheath placed over the wire. The wire was removed and the port catheter advanced into position under fluoroscopic guidance. The catheter tip is positioned in the superior cavoatrial junction. This was documented with a spot image. The portacatheter was then tested and found to flush and aspirate well. The port was flushed with saline followed by 100 units/mL heparinized saline. The pocket was then closed in two layers using first subdermal inverted interrupted absorbable sutures followed by a running subcuticular suture. The epidermis was then sealed with Dermabond. The dermatotomy at the venous access site was also closed with Dermabond. IMPRESSION: Successful placement of a right IJ approach Power Port with ultrasound and fluoroscopic guidance. The catheter is  ready for use. Electronically Signed   By: Jacqulynn Cadet M.D.   On: 03/08/2019 14:56     ELIGIBLE FOR AVAILABLE RESEARCH PROTOCOL: no  ASSESSMENT: 43 y.o. Tonya Whitehead status post left breast biopsy x2 on 12/08/2018, for multicentric invasive lobular breast cancer, grade 2, estrogen and progesterone receptor positive, HER-2 nonamplified, with an MIB-1 of 3%  (a) bilateral breast MRI 12/20/2018 shows multiple masses throughout the left breast, with nipple retraction, but no evidence of chest wall invasion, no abnormal left axillary lymph nodes, and no findings of  concern in the right breast  (b) chest CT scan and bone scan 12/29/2018 show only an incidental goiter  (1) status post left skin sparing mastectomy at [facility] 01/16/2019 for an mpT3 pN1, stage IIA invasive lobular carcinoma, grade 2; repeat prognostic panel again estrogen and progesterone receptor strongly positive, HER-2 nonamplified.  (a) a total of 8 lymph nodes removed, one with macrometastasis (8 mm, no ECE)  (2) adjuvant chemotherapy will consist of doxorubicin and cyclophosphamide in dose dense fashion x4 starting 03/13/2019, to be followed by weekly paclitaxel x12  (a) echo 03/09/2019 finds normal left ventricular function at 55-60%  (b) cycle 2 doxorubicin and cyclophosphamide delayed 1 week secondary to nausea problems  (3) adjuvant radiation to follow  (4) antiestrogens to start at the completion of local treatment  (5) genetics testing 01/09/2019 through the [facility] STAT Breast Cancer Panel + Common Hereditary Cancers Panel found no deleterious mutations in ATM, BRCA1, BRCA2, CDH1, CHEK2, PALB2, PTEN, STK11 and TP53, APC, ATM, AXIN2, BARD1, BMPR1A, BRCA1, BRCA2, BRIP1, CDH1, CDKN2A (p14ARF), CDKN2A (p16INK4a), CKD4, CHEK2, CTNNA1, DICER1, EPCAM (Deletion/duplication testing only), GREM1 (promoter region deletion/duplication testing only), KIT, MEN1, MLH1, MSH2, MSH3, MSH6, MUTYH, NBN, NF1, NHTL1, PALB2, PDGFRA, PMS2, POLD1, POLE, PTEN, RAD50, RAD51C, RAD51D SDHB, SDHC, SDHD, SMAD4, SMARCA4. STK11, TP53, TSC1, TSC2, and VHL.  The following genes were evaluated for sequence changes only: SDHA and HOXB13 c.251G>A variant only.   PLAN: Kavita has done better with this treatment.  Her anc is 600 and she has started cipro.  I am not sure what to make of the fever, however she does understand if she gets another one she really does need to call us, as it could be the sign of an infection and febrile neutropenia is an emergency in the cancer patient. However, we will not do any cultures, or any  other work up since her fever was last more than 48 hours ago and she is asymptomatic.    She will continue on IV fluids on days 3 and 4 of her treatment week since this helped her so much with this cycle.    Khalie will return in 1 week for labs, f/u with Dr. Jana Hakim, and her next treatment.  She was recommended to continue with the appropriate pandemic precautions. She knows to call for any other issue that may develop before the next visit.  I reviewed the above with him and he is in agreement.  I sent a scheduling message request regarding getting her appointments updated.    A total of (30) minutes of face-to-face time was spent with this patient with greater than 50% of that time in counseling and care-coordination.   Wilber Bihari, NP  04/04/19 12:07 PM Medical Oncology and Hematology [facility] [place], [place] [address] Tel. [phone]    Fax. [phone]

## 2019-04-05 ENCOUNTER — Encounter: Payer: Self-pay | Admitting: Adult Health

## 2019-04-09 ENCOUNTER — Telehealth: Payer: Self-pay | Admitting: Oncology

## 2019-04-09 NOTE — Progress Notes (Signed)
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 She was last seen here on 04/04/2019.   She continues on adjuvant chemotherapy consiting of doxorubicin and cyclophosphamide x4. Today is day 1 cycle 3.   She felt she did better with the second cycle, but she had a temperature September 6, up to 101 degrees.  She was actually out of town when this happened.  They packed up and return to town and by the time they were home at midnight the temperature had subsided.  She did call us and she was directed to the emergency room but she was reluctant to do that.  She was supposed to start Cipro anyway so she started that.  The fever did not recur.  Since her last visit here, she has not undergone any additional studies.    REVIEW OF SYSTEMS: Tonya Whitehead tells me that the confusion that she had after the first cycle did not occur after the second cycle.  Her port is working well.  She has had no nausea or vomiting and no mouth sores.  She had a period just before starting chemo but no further menstruation.  She is not having any hot flashes although she generally feels a little warmer than before.  A detailed review of systems today was otherwise stable   HISTORY OF  CURRENT ILLNESS: From the original intake note:  Tonya Whitehead notes a history of reconstructive left breast surgery in 2002 while living in [place], [place]. That was for symmetry. The left breast accordingly had some scar tissue and she thought that was what she was noting until mid March, when she felt the left breast was changing more and the nipple seemed to be sinking subtly.  She brought this to Tonya Whitehead attention and underwent bilateral diagnostic mammography with tomography and bilateral breast ultrasonography at [facility] on 12/01/2018 showing: breast density category C; findings concerning for extensive malignancy throughout the left breast predominately involving the upper- and lower-outer quadrants.  The mass was palpable within the upper outer left breast and there was left nipple retraction as well as indentation along the inferior margin of the left breast.  Targeted ultrasound showed three masses measuring 5.6 cm (2 o'clock position), 4.0 cm (close to the nipple and contiguous to the prior mass), and 3.3 cm (6 o'clock position.).  Also noted was a borderline left axillary lymph node adjacent to the left axillary artery (and so not easy to biopsy).  In the right breast was noted a 1.3 cm cyst.  On 12/08/2018 Tonya Whitehead proceeded to biopsy of 2 of the left breast areas in question. The pathology from this procedure (Tonya Whitehead) showed: invasive mammary carcinoma, grade 2, in two cores (at 2 o'clock and 6 o'clock) with identical histomorphology; e-cadherin is negative, consistent with a lobular phenotype. Prognostic indicators significant for: estrogen receptor, 70% positive and progesterone receptor, 70%  positive, both with strong staining intensity. Proliferation marker Ki67 at 3%. HER2 negative by immunohistochemistry (1+).  Note also the patient had a thyroid ultrasound 07/18/2018 showing bilateral solitary nodules, which were biopsied 08/09/2018.  Both showed follicular nodules,  Tonya Whitehead category 2 (benign).  The patient's subsequent history is as detailed below.   PAST MEDICAL HISTORY: Past Medical History:  Diagnosis Date  . Breast cancer ([place]) 12/08/2018   invasive lobular  . Family history of bladder cancer   . Family history of colon cancer   . Family history of lung cancer   . Family history of ovarian cancer     PAST SURGICAL HISTORY: Past Surgical History:  Procedure Laterality Date  . ADENOIDECTOMY     as a child  . BREAST EXCISIONAL BIOPSY Left 2002   reconstructive surgery on nipple to match right breast  . IR IMAGING GUIDED PORT INSERTION  03/08/2019  Adenoidectomy as a child.   FAMILY HISTORY: Family History  Problem Relation Age of Onset  . Lung cancer Mother 59       smoker  . Ovarian cancer Mother 94  . Colon cancer Maternal Aunt        6s  . Bladder Cancer Paternal Grandfather        death was unrelated  . Cancer Paternal Aunt        through [facility], blood cancer   As of May 2020 patient's father is alive at age 25. Patient's mother is also living at age 30. She has ovarian cancer, diagnosed in 2019, and lung cancer (stage IV), diagnosed in 2016. She tested negative for BRCA.  A maternal aunt had colon cancer in her 48s.  3 other siblings of the patient's mother have not had any cancer.  On the paternal side the paternal grandmother had bladder cancer.  A paternal great aunt died from cancer of the pancreas.     GYNECOLOGIC HISTORY:  No LMP recorded. Menarche: 43 years old Age at first live birth: 43 years old GX P 3 LMP every month, 5 total days with 2 heavy Contraceptive no, husband with vasectomy. HRT n/a  Hysterectomy? no BSO? 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 Husband Tonya Whitehead is a physical therapist. He is biologically [place] and was adopted by a [place] family.  The patient lives at home with her husband and their 3 children: Tonya Whitehead is 55, Tonya Whitehead is 70, and  Tonya Whitehead,  63.  The patient attends a [employer].    ADVANCED DIRECTIVES: Husband Tonya Whitehead is her HCPOA   HEALTH MAINTENANCE: Social History   Tobacco Use  . Smoking status: Never Smoker  . Smokeless tobacco: Never Used  Substance Use Topics  . Alcohol use: Not on file  . Drug use: Never     Colonoscopy: never done  PAP: not on file  Bone density: never done   Allergies  Allergen Reactions  . Gluten Meal Hives    Joint pain, upset stomach  . Sulfa Antibiotics     Unknown reaction " I was told I had a reaction when I was a baby "    Current Outpatient Medications  Medication Sig Dispense Refill  . ciprofloxacin (CIPRO) 500 MG tablet Take one tablet twice daily for 5 days starting day 8 after each chemotherapy treatment 40 tablet 1  . dexamethasone (DECADRON) 4 MG tablet Take 2 tablets by mouth once a day on the day after chemotherapy and then take 2  tablets two times a day for 2 days. Take with food. 30 tablet 1  . levothyroxine (SYNTHROID) 25 MCG tablet Take 1 tablet (25 mcg total) by mouth daily before breakfast. 60 tablet 1  . lidocaine-prilocaine (EMLA) cream Apply to affected area once 30 g 3  . loratadine (CLARITIN) 10 MG tablet Take 10 mg by mouth daily.    Marland Kitchen LORazepam (ATIVAN) 0.5 MG tablet Place 1 tablet (0.5 mg total) under the tongue every 6 (six) hours as needed (Nausea). 40 tablet 0  . metoCLOPramide (REGLAN) 5 MG tablet Take 1 tablet (5 mg total) by mouth 4 (four) times daily -  before meals and at bedtime. 40 tablet 3  . ondansetron (ZOFRAN-ODT) 4 MG disintegrating tablet     . polyethylene glycol (MIRALAX / GLYCOLAX) 17 g packet Take 17 g by mouth daily as needed for mild constipation.    . prochlorperazine (COMPAZINE) 10 MG tablet Take 1 tablet (10 mg total) by mouth every 6 (six) hours as needed (Nausea or vomiting). 30 tablet 1  . promethazine (PHENERGAN) 25 MG suppository Place 1 suppository (25 mg total) rectally every 8 (eight) hours as needed for nausea  or vomiting. 12 each 0  . promethazine (PHENERGAN) 25 MG tablet Take 1 tablet (25 mg total) by mouth every 6 (six) hours as needed for nausea or vomiting. 30 tablet 1   No current facility-administered medications for this visit.     OBJECTIVE: Young white woman in no acute distress  Vitals:   04/10/19 0855  BP: 105/77  Pulse: 74  Resp: 18  Temp: 98 F (36.7 C)  SpO2: 100%   Wt Readings from Last 3 Encounters:  04/10/19 136 lb 14.4 oz (62.1 kg)  04/04/19 135 lb 6.4 oz (61.4 kg)  03/27/19 135 lb 3.2 oz (61.3 kg)   Body mass index is 23.5 kg/m.    ECOG FS:1 - Symptomatic but completely ambulatory  Ocular: Sclerae unicteric, pupils round and equal Ear-nose-throat: Wearing a mask Lymphatic: No cervical or supraclavicular adenopathy Lungs no rales or rhonchi Heart regular rate and rhythm Abd soft, nontender, positive bowel sounds MSK no focal spinal tenderness, no joint edema Neuro: non-focal, well-oriented, appropriate affect Breasts: Deferred    LAB RESULTS:  CMP     Component Value Date/Time   NA 138 04/04/2019 1120   K 3.8 04/04/2019 1120   CL 100 04/04/2019 1120   CO2 29 04/04/2019 1120   GLUCOSE 105 (H) 04/04/2019 1120   BUN 8 04/04/2019 1120   CREATININE 0.77 04/04/2019 1120   CREATININE 0.85 03/20/2019 1022   CALCIUM 9.4 04/04/2019 1120   PROT 7.1 04/04/2019 1120   ALBUMIN 3.8 04/04/2019 1120   AST 28 04/04/2019 1120   AST 13 (L) 03/20/2019 1022   ALT 141 (H) 04/04/2019 1120   ALT 40 03/20/2019 1022   ALKPHOS 103 04/04/2019 1120   BILITOT 0.2 (L) 04/04/2019 1120   BILITOT 0.7 03/20/2019 1022   GFRNONAA >60 04/04/2019 1120   GFRNONAA >60 03/20/2019 1022   GFRAA >60 04/04/2019 1120   GFRAA >60 03/20/2019 1022    No results found for: TOTALPROTELP, ALBUMINELP, A1GS, A2GS, BETS, BETA2SER, GAMS, MSPIKE, SPEI  No results found for: KPAFRELGTCHN, LAMBDASER, KAPLAMBRATIO  Lab Results  Component Value Date   WBC 12.8 (H) 04/10/2019   NEUTROABS  PENDING 04/10/2019   HGB 11.3 (L) 04/10/2019   HCT 35.3 (L) 04/10/2019   MCV 92.7 04/10/2019   PLT 245 04/10/2019    '@LASTCHEMISTRY' @  No  results found for: LABCA2  No components found for: SVXBLT903  No results for input(s): INR in the last 168 hours.  No results found for: LABCA2  No results found for: ESP233  No results found for: AQT622  No results found for: QJF354  No results found for: CA2729  No components found for: HGQUANT  No results found for: CEA1 / No results found for: CEA1   No results found for: AFPTUMOR  No results found for: CHROMOGRNA  No results found for: PSA1  Appointment on 04/10/2019  Component Date Value Ref Range Status  . WBC 04/10/2019 12.8* 4.0 - 10.5 K/uL Final  . RBC 04/10/2019 3.81* 3.87 - 5.11 MIL/uL Final  . Hemoglobin 04/10/2019 11.3* 12.0 - 15.0 g/dL Final  . HCT 04/10/2019 35.3* 36.0 - 46.0 % Final  . MCV 04/10/2019 92.7  80.0 - 100.0 fL Final  . MCH 04/10/2019 29.7  26.0 - 34.0 pg Final  . MCHC 04/10/2019 32.0  30.0 - 36.0 g/dL Final  . RDW 04/10/2019 14.2  11.5 - 15.5 % Final  . Platelets 04/10/2019 245  150 - 400 K/uL Final  . nRBC 04/10/2019 0.0  0.0 - 0.2 % Final   Performed at [facility], [place] [address] [place], [place] [address]  . Neutrophils Relative % 04/10/2019 PENDING  % Incomplete  . Neutro Abs 04/10/2019 PENDING  1.7 - 7.7 K/uL Incomplete  . Band Neutrophils 04/10/2019 PENDING  % Incomplete  . Lymphocytes Relative 04/10/2019 PENDING  % Incomplete  . Lymphs Abs 04/10/2019 PENDING  0.7 - 4.0 K/uL Incomplete  . Monocytes Relative 04/10/2019 PENDING  % Incomplete  . Monocytes Absolute 04/10/2019 PENDING  0.1 - 1.0 K/uL Incomplete  . Eosinophils Relative 04/10/2019 PENDING  % Incomplete  . Eosinophils Absolute 04/10/2019 PENDING  0.0 - 0.5 K/uL Incomplete  . Basophils Relative 04/10/2019 PENDING  % Incomplete  . Basophils Absolute 04/10/2019 PENDING  0.0 - 0.1 K/uL Incomplete  . WBC  Morphology 04/10/2019 PENDING   Incomplete  . RBC Morphology 04/10/2019 PENDING   Incomplete  . Smear Review 04/10/2019 PENDING   Incomplete  . Other 04/10/2019 PENDING  % Incomplete  . nRBC 04/10/2019 PENDING  0 /100 WBC Incomplete  . Metamyelocytes Relative 04/10/2019 PENDING  % Incomplete  . Myelocytes 04/10/2019 PENDING  % Incomplete  . Promyelocytes Relative 04/10/2019 PENDING  % Incomplete  . Blasts 04/10/2019 PENDING  % Incomplete    (this displays the last labs from the last 3 days)  No results found for: TOTALPROTELP, ALBUMINELP, A1GS, A2GS, BETS, BETA2SER, GAMS, MSPIKE, SPEI (this displays SPEP labs)  No results found for: KPAFRELGTCHN, LAMBDASER, KAPLAMBRATIO (kappa/lambda light chains)  No results found for: HGBA, HGBA2QUANT, HGBFQUANT, HGBSQUAN (Hemoglobinopathy evaluation)   No results found for: LDH  No results found for: IRON, TIBC, IRONPCTSAT (Iron and TIBC)  No results found for: FERRITIN  Urinalysis No results found for: COLORURINE, APPEARANCEUR, LABSPEC, PHURINE, GLUCOSEU, HGBUR, BILIRUBINUR, KETONESUR, PROTEINUR, UROBILINOGEN, NITRITE, LEUKOCYTESUR   STUDIES: No results found.   ELIGIBLE FOR AVAILABLE RESEARCH PROTOCOL: no  ASSESSMENT: 43 y.o. [place] woman status post left breast biopsy x2 on 12/08/2018, for multicentric invasive lobular breast cancer, grade 2, estrogen and progesterone receptor positive, HER-2 nonamplified, with an MIB-1 of 3%  (a) bilateral breast MRI 12/20/2018 shows multiple masses throughout the left breast, with nipple retraction, but no evidence of chest wall invasion, no abnormal left axillary lymph nodes, and no findings of concern in the right breast  (b) chest  CT scan and bone scan 12/29/2018 show only an incidental goiter  (1) status post left skin sparing mastectomy at [facility] 01/16/2019 for an mpT3 pN1, stage IIA invasive lobular carcinoma, grade 2; repeat prognostic panel again estrogen and progesterone receptor strongly  positive, HER-2 nonamplified.  (a) a total of 8 lymph nodes removed, one with macrometastasis (8 mm, no ECE)  (2) adjuvant chemotherapy will consist of doxorubicin and cyclophosphamide in dose dense fashion x4 starting 03/13/2019, to be followed by weekly paclitaxel x12  (a) echo 03/09/2019 finds normal left ventricular function at 55-60%  (b) cycle 2 doxorubicin and cyclophosphamide delayed 1 week secondary to nausea problems  (3) adjuvant radiation to follow  (4) antiestrogens to start at the completion of local treatment  (5) genetics testing 01/09/2019 through the [facility] STAT Breast Cancer Panel + Common Hereditary Cancers Panel found no deleterious mutations in ATM, BRCA1, BRCA2, CDH1, CHEK2, PALB2, PTEN, STK11 and TP53, APC, ATM, AXIN2, BARD1, BMPR1A, BRCA1, BRCA2, BRIP1, CDH1, CDKN2A (p14ARF), CDKN2A (p16INK4a), CKD4, CHEK2, CTNNA1, DICER1, EPCAM (Deletion/duplication testing only), GREM1 (promoter region deletion/duplication testing only), KIT, MEN1, MLH1, MSH2, MSH3, MSH6, MUTYH, NBN, NF1, NHTL1, PALB2, PDGFRA, PMS2, POLD1, POLE, PTEN, RAD50, RAD51C, RAD51D SDHB, SDHC, SDHD, SMAD4, SMARCA4. STK11, TP53, TSC1, TSC2, and VHL.  The following genes were evaluated for sequence changes only: SDHA and HOXB13 c.251G>A variant only.   PLAN: Toyia is ready to proceed to cycle 3 of 4 of her doxorubicin and cyclophosphamide today.  She will receive fluids 2 and 3 days from now, and again next week as needed.  We discussed what to do if she develops a fever again.  She definitely needs to call us.  She does have Cipro and she will resume Cipro on day 7 of this cycle as she does with every cycle.  I have encouraged her to continue to walk for exercise.  She is walking about 15 minutes once or twice a day at present.  Overall I think she is doing much better with her treatments and after the last 1, 2 weeks from now, life will become much easier once she starts the weekly paclitaxel  She knows to  call for any other issue that may develop before the next visit.  Whitehead, Tonya Dad, MD  04/10/19 9:16 AM Medical Oncology and Hematology [facility] [place], [place] [address] Tel. [phone]    Fax. [phone]  I, Jacqualyn Posey am acting as a Education administrator for Tonya Cruel, MD.   I, Lurline Del MD, have reviewed the above documentation for accuracy and completeness, and I agree with the above.

## 2019-04-09 NOTE — Telephone Encounter (Signed)
Scheduled appt per 9/10 sch message - pt to get an updated schedule next visit 9/15

## 2019-04-10 ENCOUNTER — Inpatient Hospital Stay: Payer: 59

## 2019-04-10 ENCOUNTER — Inpatient Hospital Stay (HOSPITAL_BASED_OUTPATIENT_CLINIC_OR_DEPARTMENT_OTHER): Payer: 59 | Admitting: Oncology

## 2019-04-10 ENCOUNTER — Encounter: Payer: Self-pay | Admitting: *Deleted

## 2019-04-10 ENCOUNTER — Other Ambulatory Visit: Payer: Self-pay

## 2019-04-10 VITALS — BP 105/77 | HR 74 | Temp 98.0°F | Resp 18 | Ht 64.0 in | Wt 136.9 lb

## 2019-04-10 DIAGNOSIS — Z170 Estrogen receptor positive status [ER+]: Secondary | ICD-10-CM | POA: Diagnosis not present

## 2019-04-10 DIAGNOSIS — Z5111 Encounter for antineoplastic chemotherapy: Secondary | ICD-10-CM | POA: Diagnosis not present

## 2019-04-10 DIAGNOSIS — C50812 Malignant neoplasm of overlapping sites of left female breast: Secondary | ICD-10-CM | POA: Diagnosis not present

## 2019-04-10 LAB — COMPREHENSIVE METABOLIC PANEL
ALT: 100 U/L — ABNORMAL HIGH (ref 0–44)
AST: 45 U/L — ABNORMAL HIGH (ref 15–41)
Albumin: 3.9 g/dL (ref 3.5–5.0)
Alkaline Phosphatase: 91 U/L (ref 38–126)
Anion gap: 7 (ref 5–15)
BUN: 10 mg/dL (ref 6–20)
CO2: 25 mmol/L (ref 22–32)
Calcium: 8.8 mg/dL — ABNORMAL LOW (ref 8.9–10.3)
Chloride: 109 mmol/L (ref 98–111)
Creatinine, Ser: 0.69 mg/dL (ref 0.44–1.00)
GFR calc Af Amer: >60 mL/min (ref >60)
GFR calc non Af Amer: >60 mL/min (ref >60)
Glucose, Bld: 121 mg/dL — ABNORMAL HIGH (ref 70–99)
Potassium: 4.3 mmol/L (ref 3.5–5.1)
Sodium: 141 mmol/L (ref 135–145)
Total Bilirubin: <0.2 mg/dL — ABNORMAL LOW (ref 0.3–1.2)
Total Protein: 6.9 g/dL (ref 6.5–8.1)

## 2019-04-10 LAB — CBC WITH DIFFERENTIAL/PLATELET
Abs Immature Granulocytes: 0.95 10*3/uL — ABNORMAL HIGH (ref 0.00–0.07)
Basophils Absolute: 0.1 10*3/uL (ref 0.0–0.1)
Basophils Relative: 1 %
Eosinophils Absolute: 0.1 10*3/uL (ref 0.0–0.5)
Eosinophils Relative: 0 %
HCT: 35.3 % — ABNORMAL LOW (ref 36.0–46.0)
Hemoglobin: 11.3 g/dL — ABNORMAL LOW (ref 12.0–15.0)
Immature Granulocytes: 7 %
Lymphocytes Relative: 8 %
Lymphs Abs: 1 10*3/uL (ref 0.7–4.0)
MCH: 29.7 pg (ref 26.0–34.0)
MCHC: 32 g/dL (ref 30.0–36.0)
MCV: 92.7 fL (ref 80.0–100.0)
Monocytes Absolute: 0.6 10*3/uL (ref 0.1–1.0)
Monocytes Relative: 5 %
Neutro Abs: 10.1 10*3/uL — ABNORMAL HIGH (ref 1.7–7.7)
Neutrophils Relative %: 79 %
Platelets: 245 10*3/uL (ref 150–400)
RBC: 3.81 MIL/uL — ABNORMAL LOW (ref 3.87–5.11)
RDW: 14.2 % (ref 11.5–15.5)
WBC: 12.8 10*3/uL — ABNORMAL HIGH (ref 4.0–10.5)
nRBC: 0 % (ref 0.0–0.2)

## 2019-04-10 MED ORDER — PEGFILGRASTIM 6 MG/0.6ML ~~LOC~~ PSKT
PREFILLED_SYRINGE | SUBCUTANEOUS | Status: AC
  Filled 2019-04-10: qty 0.6

## 2019-04-10 MED ORDER — SODIUM CHLORIDE 0.9 % IV SOLN
600.0000 mg/m2 | Freq: Once | INTRAVENOUS | Status: AC
  Administered 2019-04-10: 1040 mg via INTRAVENOUS
  Filled 2019-04-10: qty 52

## 2019-04-10 MED ORDER — SODIUM CHLORIDE 0.9% FLUSH
10.0000 mL | INTRAVENOUS | Status: DC | PRN
  Administered 2019-04-10: 10 mL
  Filled 2019-04-10: qty 10

## 2019-04-10 MED ORDER — LORAZEPAM 2 MG/ML IJ SOLN
0.5000 mg | Freq: Once | INTRAMUSCULAR | Status: AC
  Administered 2019-04-10: 0.5 mg via INTRAVENOUS

## 2019-04-10 MED ORDER — SODIUM CHLORIDE 0.9 % IV SOLN
Freq: Once | INTRAVENOUS | Status: AC
  Administered 2019-04-10: 10:00:00 via INTRAVENOUS
  Filled 2019-04-10: qty 250

## 2019-04-10 MED ORDER — LORAZEPAM 2 MG/ML IJ SOLN
INTRAMUSCULAR | Status: AC
  Filled 2019-04-10: qty 1

## 2019-04-10 MED ORDER — PALONOSETRON HCL INJECTION 0.25 MG/5ML
INTRAVENOUS | Status: AC
  Filled 2019-04-10: qty 5

## 2019-04-10 MED ORDER — DOXORUBICIN HCL CHEMO IV INJECTION 2 MG/ML
60.0000 mg/m2 | Freq: Once | INTRAVENOUS | Status: AC
  Administered 2019-04-10: 104 mg via INTRAVENOUS
  Filled 2019-04-10: qty 52

## 2019-04-10 MED ORDER — SODIUM CHLORIDE 0.9 % IV SOLN
Freq: Once | INTRAVENOUS | Status: AC
  Administered 2019-04-10: 10:00:00 via INTRAVENOUS
  Filled 2019-04-10: qty 5

## 2019-04-10 MED ORDER — PALONOSETRON HCL INJECTION 0.25 MG/5ML
0.2500 mg | Freq: Once | INTRAVENOUS | Status: AC
  Administered 2019-04-10: 0.25 mg via INTRAVENOUS

## 2019-04-10 MED ORDER — HEPARIN SOD (PORK) LOCK FLUSH 100 UNIT/ML IV SOLN
500.0000 [IU] | Freq: Once | INTRAVENOUS | Status: AC | PRN
  Administered 2019-04-10: 500 [IU]
  Filled 2019-04-10: qty 5

## 2019-04-10 MED ORDER — PEGFILGRASTIM 6 MG/0.6ML ~~LOC~~ PSKT
6.0000 mg | PREFILLED_SYRINGE | Freq: Once | SUBCUTANEOUS | Status: AC
  Administered 2019-04-10: 6 mg via SUBCUTANEOUS

## 2019-04-10 NOTE — Progress Notes (Signed)
Per Dr. Jana Hakim patient is OK to treat with ALT of 100

## 2019-04-10 NOTE — Patient Instructions (Signed)
[facility] Discharge Instructions for Patients Receiving Chemotherapy  Today you received the following chemotherapy agents Doxorubicin (ADRIAMYCIN) & Cyclophosphamide (CYTOXAN).  To help prevent nausea and vomiting after your treatment, we encourage you to take your nausea medication as prescribed.   If you develop nausea and vomiting that is not controlled by your nausea medication, call the clinic.   BELOW ARE SYMPTOMS THAT SHOULD BE REPORTED IMMEDIATELY:  *FEVER GREATER THAN 100.5 F  *CHILLS WITH OR WITHOUT FEVER  NAUSEA AND VOMITING THAT IS NOT CONTROLLED WITH YOUR NAUSEA MEDICATION  *UNUSUAL SHORTNESS OF BREATH  *UNUSUAL BRUISING OR BLEEDING  TENDERNESS IN MOUTH AND THROAT WITH OR WITHOUT PRESENCE OF ULCERS  *URINARY PROBLEMS  *BOWEL PROBLEMS  UNUSUAL RASH Items with * indicate a potential emergency and should be followed up as soon as possible.  Feel free to call the clinic should you have any questions or concerns. The clinic phone number is [phone].  Please show the [place] at check-in to the Emergency Department and triage nurse.  Pegfilgrastim injection What is this medicine? PEGFILGRASTIM (PEG fil gra stim) is a long-acting granulocyte colony-stimulating factor that stimulates the growth of neutrophils, a type of white blood cell important in the body's fight against infection. It is used to reduce the incidence of fever and infection in patients with certain types of cancer who are receiving chemotherapy that affects the bone marrow, and to increase survival after being exposed to high doses of radiation. This medicine may be used for other purposes; ask your health care provider or pharmacist if you have questions. COMMON BRAND NAME(S): Steve Rattler, Ziextenzo What should I tell my health care provider before I take this medicine? They need to know if you have any of these conditions:  kidney disease  latex  allergy  ongoing radiation therapy  sickle cell disease  skin reactions to acrylic adhesives (On-Body Injector only)  an unusual or allergic reaction to pegfilgrastim, filgrastim, other medicines, foods, dyes, or preservatives  pregnant or trying to get pregnant  breast-feeding How should I use this medicine? This medicine is for injection under the skin. If you get this medicine at home, you will be taught how to prepare and give the pre-filled syringe or how to use the On-body Injector. Refer to the patient Instructions for Use for detailed instructions. Use exactly as directed. Tell your healthcare provider immediately if you suspect that the On-body Injector may not have performed as intended or if you suspect the use of the On-body Injector resulted in a missed or partial dose. It is important that you put your used needles and syringes in a special sharps container. Do not put them in a trash can. If you do not have a sharps container, call your pharmacist or healthcare provider to get one. Talk to your pediatrician regarding the use of this medicine in children. While this drug may be prescribed for selected conditions, precautions do apply. Overdosage: If you think you have taken too much of this medicine contact a poison control center or emergency room at once. NOTE: This medicine is only for you. Do not share this medicine with others. What if I miss a dose? It is important not to miss your dose. Call your doctor or health care professional if you miss your dose. If you miss a dose due to an On-body Injector failure or leakage, a new dose should be administered as soon as possible using a single prefilled syringe for manual  use. What may interact with this medicine? Interactions have not been studied. Give your health care provider a list of all the medicines, herbs, non-prescription drugs, or dietary supplements you use. Also tell them if you smoke, drink alcohol, or use illegal  drugs. Some items may interact with your medicine. This list may not describe all possible interactions. Give your health care provider a list of all the medicines, herbs, non-prescription drugs, or dietary supplements you use. Also tell them if you smoke, drink alcohol, or use illegal drugs. Some items may interact with your medicine. What should I watch for while using this medicine? You may need blood work done while you are taking this medicine. If you are going to need a MRI, CT scan, or other procedure, tell your doctor that you are using this medicine (On-Body Injector only). What side effects may I notice from receiving this medicine? Side effects that you should report to your doctor or health care professional as soon as possible:  allergic reactions like skin rash, itching or hives, swelling of the face, lips, or tongue  back pain  dizziness  fever  pain, redness, or irritation at site where injected  pinpoint red spots on the skin  red or dark-brown urine  shortness of breath or breathing problems  stomach or side pain, or pain at the shoulder  swelling  tiredness  trouble passing urine or change in the amount of urine Side effects that usually do not require medical attention (report to your doctor or health care professional if they continue or are bothersome):  bone pain  muscle pain This list may not describe all possible side effects. Call your doctor for medical advice about side effects. You may report side effects to FDA at 1-800-FDA-1088. Where should I keep my medicine? Keep out of the reach of children. If you are using this medicine at home, you will be instructed on how to store it. Throw away any unused medicine after the expiration date on the label. NOTE: This sheet is a summary. It may not cover all possible information. If you have questions about this medicine, talk to your doctor, pharmacist, or health care provider.  2020 Elsevier/Gold  Standard (2017-10-17 16:57:08)  Coronavirus (COVID-19) Are you at risk?  Are you at risk for the Coronavirus (COVID-19)?  To be considered HIGH RISK for Coronavirus (COVID-19), you have to meet the following criteria:  . Traveled to [place], [place], [place], [place] or [place]; or in the [place] to [place], [place], [place], or [place]; and have fever, cough, and shortness of breath within the last 2 weeks of travel OR . Been in close contact with a person diagnosed with COVID-19 within the last 2 weeks and have fever, cough, and shortness of breath . IF YOU DO NOT MEET THESE CRITERIA, YOU ARE CONSIDERED LOW RISK FOR COVID-19.  What to do if you are HIGH RISK for COVID-19?  . If you are having a medical emergency, call 911. . Seek medical care right away. Before you go to a doctor's office, urgent care or emergency department, call ahead and tell them about your recent travel, contact with someone diagnosed with COVID-19, and your symptoms. You should receive instructions from your physician's office regarding next steps of care.  . When you arrive at healthcare provider, tell the healthcare staff immediately you have returned from visiting [place], [place], [place], [place] or [place]; or traveled in the [place] to [place], [place], [place],   or [place]; in the last two weeks or you have been in close contact with a person diagnosed with COVID-19 in the last 2 weeks.   . Tell the health care staff about your symptoms: fever, cough and shortness of breath. . After you have been seen by a medical provider, you will be either: o Tested for (COVID-19) and discharged home on quarantine except to seek medical care if symptoms worsen, and asked to  - Stay home and avoid contact with others until you get your results (4-5 days)  - Avoid travel on public transportation if possible (such as bus, train, or airplane) or o Sent to the Emergency Department by EMS for evaluation,  COVID-19 testing, and possible admission depending on your condition and test results.  What to do if you are LOW RISK for COVID-19?  Reduce your risk of any infection by using the same precautions used for avoiding the common cold or flu:  . Wash your hands often with soap and warm water for at least 20 seconds.  If soap and water are not readily available, use an alcohol-based hand sanitizer with at least 60% alcohol.  . If coughing or sneezing, cover your mouth and nose by coughing or sneezing into the elbow areas of your shirt or coat, into a tissue or into your sleeve (not your hands). . Avoid shaking hands with others and consider head nods or verbal greetings only. . Avoid touching your eyes, nose, or mouth with unwashed hands.  . Avoid close contact with people who are sick. . Avoid places or events with large numbers of people in one location, like concerts or sporting events. . Carefully consider travel plans you have or are making. . If you are planning any travel outside or inside the [place], visit the CDC's Travelers' Health webpage for the latest health notices. . If you have some symptoms but not all symptoms, continue to monitor at home and seek medical attention if your symptoms worsen. . If you are having a medical emergency, call 911.   ADDITIONAL HEALTHCARE OPTIONS FOR PATIENTS  [facility] / e-Visit: [email or web address]         [facility]: [phone]  [facility]: [phone]                   [facility]: [phone]    

## 2019-04-10 NOTE — Progress Notes (Signed)
Shortly after cyclophosphamide initiated, patient began c/o nausea. Infusion paused. Sandi Mealy, PA-C notified. Orders received, repeated, and confirmed. Medicated per MAR. Patient verbalized near-immediate total relief of nausea. Notified Dr. Jana Hakim of event. Advised to have patient take lorazepam 0.5 mg po prior to each treatment. Patient aware and verbalizes understanding.

## 2019-04-11 ENCOUNTER — Telehealth: Payer: Self-pay

## 2019-04-11 NOTE — Telephone Encounter (Signed)
Pt lvm stating that her Onpro device went off at 1547 as it should have but then had a series of beeps about 15 minutes later and started flashing red.  Pt states she attempted to contact customer service but was not successful.  She removed the device. Per Dr Jana Hakim pt will need to have another Onpro placed or receive a Neulasta injection when she comes in for IVFs on 04/12/19. In basket msg sent to [facility], chemo PA specialist, to obtain PA for additional dose to be given when she comes in for IVFs. Called pt and lvm with above information

## 2019-04-12 ENCOUNTER — Other Ambulatory Visit: Payer: Self-pay

## 2019-04-12 ENCOUNTER — Inpatient Hospital Stay: Payer: 59

## 2019-04-12 VITALS — BP 102/71 | HR 78 | Temp 98.2°F | Resp 16

## 2019-04-12 DIAGNOSIS — Z170 Estrogen receptor positive status [ER+]: Secondary | ICD-10-CM

## 2019-04-12 DIAGNOSIS — C50812 Malignant neoplasm of overlapping sites of left female breast: Secondary | ICD-10-CM

## 2019-04-12 DIAGNOSIS — Z5111 Encounter for antineoplastic chemotherapy: Secondary | ICD-10-CM | POA: Diagnosis not present

## 2019-04-12 MED ORDER — PEGFILGRASTIM INJECTION 6 MG/0.6ML ~~LOC~~
6.0000 mg | PREFILLED_SYRINGE | Freq: Once | SUBCUTANEOUS | Status: AC
  Administered 2019-04-12: 6 mg via SUBCUTANEOUS

## 2019-04-12 MED ORDER — SODIUM CHLORIDE 0.9% FLUSH
10.0000 mL | Freq: Once | INTRAVENOUS | Status: DC | PRN
  Filled 2019-04-12: qty 10

## 2019-04-12 MED ORDER — PROCHLORPERAZINE EDISYLATE 10 MG/2ML IJ SOLN
INTRAMUSCULAR | Status: AC
  Filled 2019-04-12: qty 2

## 2019-04-12 MED ORDER — HEPARIN SOD (PORK) LOCK FLUSH 100 UNIT/ML IV SOLN
500.0000 [IU] | Freq: Once | INTRAVENOUS | Status: DC | PRN
  Filled 2019-04-12: qty 5

## 2019-04-12 MED ORDER — PROCHLORPERAZINE EDISYLATE 10 MG/2ML IJ SOLN
10.0000 mg | Freq: Once | INTRAMUSCULAR | Status: AC | PRN
  Administered 2019-04-12: 10 mg via INTRAVENOUS
  Filled 2019-04-12: qty 2

## 2019-04-12 MED ORDER — SODIUM CHLORIDE 0.9 % IV SOLN
INTRAVENOUS | Status: AC
  Administered 2019-04-12: 08:00:00 via INTRAVENOUS
  Filled 2019-04-12 (×2): qty 250

## 2019-04-12 MED ORDER — PEGFILGRASTIM INJECTION 6 MG/0.6ML ~~LOC~~
PREFILLED_SYRINGE | SUBCUTANEOUS | Status: AC
  Filled 2019-04-12: qty 0.6

## 2019-04-12 NOTE — Patient Instructions (Signed)
Nausea and Vomiting, Adult Nausea is the feeling that you have an upset stomach or that you are about to vomit. Vomiting is when stomach contents are thrown up and out of the mouth as a result of nausea. Vomiting can make you feel weak and cause you to become dehydrated. Dehydration can make you feel tired and thirsty, cause you to have a dry mouth, and decrease how often you urinate. Older adults and people with other diseases or a weak disease-fighting system (immune system) are at higher risk for dehydration. It is important to treat your nausea and vomiting as told by your health care provider. Follow these instructions at home: Watch your symptoms for any changes. Tell your health care provider about them. Follow these instructions to care for yourself at home. Eating and drinking      Take an oral rehydration solution (ORS). This is a drink that is sold at pharmacies and retail stores.  Drink clear fluids slowly and in small amounts as you are able. Clear fluids include water, ice chips, low-calorie sports drinks, and fruit juice that has water added (diluted fruit juice).  Eat bland, easy-to-digest foods in small amounts as you are able. These foods include bananas, applesauce, rice, lean meats, toast, and crackers.  Avoid fluids that contain a lot of sugar or caffeine, such as energy drinks, sports drinks, and soda.  Avoid alcohol.  Avoid spicy or fatty foods. General instructions  Take over-the-counter and prescription medicines only as told by your health care provider.  Drink enough fluid to keep your urine pale yellow.  Wash your hands often using soap and water. If soap and water are not available, use hand sanitizer.  Make sure that all people in your household wash their hands well and often.  Rest at home while you recover.  Watch your condition for any changes.  Breathe slowly and deeply when you feel nauseated.  Keep all follow-up visits as told by your health  care provider. This is important. Contact a health care provider if:  Your symptoms get worse.  You have new symptoms.  You have a fever.  You cannot drink fluids without vomiting.  Your nausea does not go away after 2 days.  You feel light-headed or dizzy.  You have a headache.  You have muscle cramps.  You have a rash.  You have pain while urinating. Get help right away if:  You have pain in your chest, neck, arm, or jaw.  You feel extremely weak or you faint.  You have persistent vomiting.  You have vomit that is bright red or looks like black coffee grounds.  You have bloody or black stools or stools that look like tar.  You have a severe headache, a stiff neck, or both.  You have severe pain, cramping, or bloating in your abdomen.  You have difficulty breathing, or you are breathing very quickly.  Your heart is beating very quickly.  Your skin feels cold and clammy.  You feel confused.  You have signs of dehydration, such as: ? Dark urine, very little urine, or no urine. ? Cracked lips. ? Dry mouth. ? Sunken eyes. ? Sleepiness. ? Weakness. These symptoms may represent a serious problem that is an emergency. Do not wait to see if the symptoms will go away. Get medical help right away. Call your local emergency services (911 in the [place] Do not drive yourself to the hospital. Summary  Nausea is the feeling that you have an upset stomach  or that you are about to vomit. As nausea gets worse, it can lead to vomiting. Vomiting can make you feel weak and cause you to become dehydrated.  Follow instructions from your health care provider about eating and drinking to prevent dehydration.  Take over-the-counter and prescription medicines only as told by your health care provider.  Contact your health care provider if your symptoms get worse, or you have new symptoms.  Keep all follow-up visits as told by your health care provider. This is important. This  information is not intended to replace advice given to you by your health care provider. Make sure you discuss any questions you have with your health care provider. Document Released: 07/12/2005 Document Revised: 11/03/2018 Document Reviewed: 12/20/2017 Elsevier Patient Education  [place].   Rehydration, Adult Rehydration is the replacement of body fluids and salts and minerals (electrolytes) that are lost during dehydration. Dehydration is when there is not enough fluid or water in the body. This happens when you lose more fluids than you take in. Common causes of dehydration include:  Vomiting.  Diarrhea.  Excessive sweating, such as from heat exposure or exercise.  Taking medicines that cause the body to lose excess fluid (diuretics).  Impaired kidney function.  Not drinking enough fluid.  Certain illnesses or infections.  Certain poorly controlled long-term (chronic) illnesses, such as diabetes, heart disease, and kidney disease.  Symptoms of mild dehydration may include thirst, dry lips and mouth, dry skin, and dizziness. Symptoms of severe dehydration may include increased heart rate, confusion, fainting, and not urinating. You can rehydrate by drinking certain fluids or getting fluids through an IV tube, as told by your health care provider. What are the risks? Generally, rehydration is safe. However, one problem that can happen is taking in too much fluid (overhydration). This is rare. If overhydration happens, it can cause an electrolyte imbalance, kidney failure, or a decrease in salt (sodium) levels in the body. How to rehydrate Follow instructions from your health care provider for rehydration. The kind of fluid you should drink and the amount you should drink depend on your condition.  If directed by your health care provider, drink an oral rehydration solution (ORS). This is a drink designed to treat dehydration that is found in pharmacies and retail stores.  ? Make an ORS by following instructions on the package. ? Start by drinking small amounts, about  cup (120 mL) every 5-10 minutes. ? Slowly increase how much you drink until you have taken the amount recommended by your health care provider.  Drink enough clear fluids to keep your urine clear or pale yellow. If you were instructed to drink an ORS, finish the ORS first, then start slowly drinking other clear fluids. Drink fluids such as: ? Water. Do not drink only water. Doing that can lead to having too little sodium in your body (hyponatremia). ? Ice chips. ? Fruit juice that you have added water to (diluted juice). ? Low-calorie sports drinks.  If you are severely dehydrated, your health care provider may recommend that you receive fluids through an IV tube in the hospital.  Do not take sodium tablets. Doing that can lead to the condition of having too much sodium in your body (hypernatremia). Eating while you rehydrate Follow instructions from your health care provider about what to eat while you rehydrate. Your health care provider may recommend that you slowly begin eating regular foods in small amounts.  Eat foods that contain a healthy balance of  electrolytes, such as bananas, oranges, potatoes, tomatoes, and spinach.  Avoid foods that are greasy or contain a lot of fat or sugar.  In some cases, you may get nutrition through a feeding tube that is passed through your nose and into your stomach (nasogastric tube, or NG tube). This may be done if you have uncontrolled vomiting or diarrhea. Beverages to avoid Certain beverages may make dehydration worse. While you rehydrate, avoid:  Alcohol.  Caffeine.  Drinks that contain a lot of sugar. These include: ? High-calorie sports drinks. ? Fruit juice that is not diluted. ? Soda.  Check nutrition labels to see how much sugar or caffeine a beverage contains. Signs of dehydration recovery You may be recovering from dehydration if:   You are urinating more often than before you started rehydrating.  Your urine is clear or pale yellow.  Your energy level improves.  You vomit less frequently.  You have diarrhea less frequently.  Your appetite improves or returns to normal.  You feel less dizzy or less light-headed.  Your skin tone and color start to look more normal. Contact a health care provider if:  You continue to have symptoms of mild dehydration, such as: ? Thirst. ? Dry lips. ? Slightly dry mouth. ? Dry, warm skin. ? Dizziness.  You continue to vomit or have diarrhea. Get help right away if:  You have symptoms of dehydration that get worse.  You feel: ? Confused. ? Weak. ? Like you are going to faint.  You have not urinated in 6-8 hours.  You have very dark urine.  You have trouble breathing.  Your heart rate while sitting still is over 100 beats a minute.  You cannot drink fluids without vomiting.  You have vomiting or diarrhea that: ? Gets worse. ? Does not go away.  You have a fever. This information is not intended to replace advice given to you by your health care provider. Make sure you discuss any questions you have with your health care provider. Document Released: 10/04/2011 Document Revised: 06/24/2017 Document Reviewed: 09/05/2015 Elsevier Patient Education  2020 [employer].

## 2019-04-12 NOTE — Progress Notes (Signed)
Patient's Neulasta Onpro device malfunctioned and patient did not received injection via Onpro device. Neulasta injection x1 ordered. Patient will bring device in so attempt can be made to get drug replacement.   Demetrius Charity, PharmD, [facility] Pharmacist Pharmacy Phone: [phone] 04/12/2019

## 2019-04-13 ENCOUNTER — Inpatient Hospital Stay: Payer: 59

## 2019-04-13 ENCOUNTER — Telehealth: Payer: Self-pay | Admitting: *Deleted

## 2019-04-13 NOTE — Telephone Encounter (Signed)
Pt called to inform she does not need IVF today.

## 2019-04-16 ENCOUNTER — Telehealth: Payer: Self-pay | Admitting: Oncology

## 2019-04-16 NOTE — Telephone Encounter (Signed)
[facility] PAL 9/22 moved f/u to [facility] per [place]. D/t per LC?CH/ left message for patient.

## 2019-04-17 ENCOUNTER — Inpatient Hospital Stay: Payer: 59 | Admitting: Physician Assistant

## 2019-04-17 ENCOUNTER — Telehealth: Payer: Self-pay

## 2019-04-17 ENCOUNTER — Inpatient Hospital Stay: Payer: 59

## 2019-04-17 NOTE — Telephone Encounter (Signed)
Pt requesting if she can cancel lab/flush/PA appointment today due to work related issues.    Pt is s/p D1C3 A/C.  Pt reports no issues with being able to hydrate at home, has antibiotic Cipro. RN reviewed with MD.  MD OK with patient cancelling if she feels well.  Pt updated, will notify clinic if any changes occur.

## 2019-04-18 ENCOUNTER — Other Ambulatory Visit: Payer: Self-pay | Admitting: Oncology

## 2019-04-24 ENCOUNTER — Encounter: Payer: Self-pay | Admitting: *Deleted

## 2019-04-24 ENCOUNTER — Inpatient Hospital Stay: Payer: 59

## 2019-04-24 ENCOUNTER — Telehealth: Payer: Self-pay | Admitting: *Deleted

## 2019-04-24 ENCOUNTER — Other Ambulatory Visit: Payer: Self-pay

## 2019-04-24 ENCOUNTER — Inpatient Hospital Stay (HOSPITAL_BASED_OUTPATIENT_CLINIC_OR_DEPARTMENT_OTHER): Payer: 59 | Admitting: Adult Health

## 2019-04-24 ENCOUNTER — Encounter: Payer: Self-pay | Admitting: Adult Health

## 2019-04-24 VITALS — BP 115/79 | HR 84 | Temp 98.7°F | Resp 18 | Ht 64.0 in | Wt 138.0 lb

## 2019-04-24 DIAGNOSIS — Z170 Estrogen receptor positive status [ER+]: Secondary | ICD-10-CM

## 2019-04-24 DIAGNOSIS — C50812 Malignant neoplasm of overlapping sites of left female breast: Secondary | ICD-10-CM

## 2019-04-24 DIAGNOSIS — Z5111 Encounter for antineoplastic chemotherapy: Secondary | ICD-10-CM | POA: Diagnosis not present

## 2019-04-24 LAB — CBC WITH DIFFERENTIAL/PLATELET
Abs Immature Granulocytes: 1.53 10*3/uL — ABNORMAL HIGH (ref 0.00–0.07)
Basophils Absolute: 0.2 10*3/uL — ABNORMAL HIGH (ref 0.0–0.1)
Basophils Relative: 1 %
Eosinophils Absolute: 0 10*3/uL (ref 0.0–0.5)
Eosinophils Relative: 0 %
HCT: 33.2 % — ABNORMAL LOW (ref 36.0–46.0)
Hemoglobin: 11 g/dL — ABNORMAL LOW (ref 12.0–15.0)
Immature Granulocytes: 9 %
Lymphocytes Relative: 5 %
Lymphs Abs: 0.9 10*3/uL (ref 0.7–4.0)
MCH: 30.7 pg (ref 26.0–34.0)
MCHC: 33.1 g/dL (ref 30.0–36.0)
MCV: 92.7 fL (ref 80.0–100.0)
Monocytes Absolute: 0.9 10*3/uL (ref 0.1–1.0)
Monocytes Relative: 5 %
Neutro Abs: 13.6 10*3/uL — ABNORMAL HIGH (ref 1.7–7.7)
Neutrophils Relative %: 80 %
Platelets: 252 10*3/uL (ref 150–400)
RBC: 3.58 MIL/uL — ABNORMAL LOW (ref 3.87–5.11)
RDW: 16.3 % — ABNORMAL HIGH (ref 11.5–15.5)
WBC: 17.2 10*3/uL — ABNORMAL HIGH (ref 4.0–10.5)
nRBC: 0.3 % — ABNORMAL HIGH (ref 0.0–0.2)

## 2019-04-24 LAB — COMPREHENSIVE METABOLIC PANEL
ALT: 96 U/L — ABNORMAL HIGH (ref 0–44)
AST: 37 U/L (ref 15–41)
Albumin: 4.1 g/dL (ref 3.5–5.0)
Alkaline Phosphatase: 101 U/L (ref 38–126)
Anion gap: 8 (ref 5–15)
BUN: 10 mg/dL (ref 6–20)
CO2: 27 mmol/L (ref 22–32)
Calcium: 9.2 mg/dL (ref 8.9–10.3)
Chloride: 105 mmol/L (ref 98–111)
Creatinine, Ser: 0.69 mg/dL (ref 0.44–1.00)
GFR calc Af Amer: >60 mL/min (ref >60)
GFR calc non Af Amer: >60 mL/min (ref >60)
Glucose, Bld: 107 mg/dL — ABNORMAL HIGH (ref 70–99)
Potassium: 4.2 mmol/L (ref 3.5–5.1)
Sodium: 140 mmol/L (ref 135–145)
Total Bilirubin: <0.2 mg/dL — ABNORMAL LOW (ref 0.3–1.2)
Total Protein: 6.9 g/dL (ref 6.5–8.1)

## 2019-04-24 MED ORDER — DOXORUBICIN HCL CHEMO IV INJECTION 2 MG/ML
60.0000 mg/m2 | Freq: Once | INTRAVENOUS | Status: AC
  Administered 2019-04-24: 104 mg via INTRAVENOUS
  Filled 2019-04-24: qty 52

## 2019-04-24 MED ORDER — HEPARIN SOD (PORK) LOCK FLUSH 100 UNIT/ML IV SOLN
500.0000 [IU] | Freq: Once | INTRAVENOUS | Status: AC | PRN
  Administered 2019-04-24: 15:00:00 500 [IU]
  Filled 2019-04-24: qty 5

## 2019-04-24 MED ORDER — PEGFILGRASTIM 6 MG/0.6ML ~~LOC~~ PSKT
6.0000 mg | PREFILLED_SYRINGE | Freq: Once | SUBCUTANEOUS | Status: AC
  Administered 2019-04-24: 6 mg via SUBCUTANEOUS

## 2019-04-24 MED ORDER — PEGFILGRASTIM 6 MG/0.6ML ~~LOC~~ PSKT
PREFILLED_SYRINGE | SUBCUTANEOUS | Status: AC
  Filled 2019-04-24: qty 0.6

## 2019-04-24 MED ORDER — SODIUM CHLORIDE 0.9% FLUSH
10.0000 mL | Freq: Once | INTRAVENOUS | Status: AC | PRN
  Administered 2019-04-24: 11:00:00 10 mL
  Filled 2019-04-24: qty 10

## 2019-04-24 MED ORDER — SODIUM CHLORIDE 0.9 % IV SOLN
Freq: Once | INTRAVENOUS | Status: AC
  Administered 2019-04-24: 13:00:00 via INTRAVENOUS
  Filled 2019-04-24: qty 250

## 2019-04-24 MED ORDER — PALONOSETRON HCL INJECTION 0.25 MG/5ML
0.2500 mg | Freq: Once | INTRAVENOUS | Status: AC
  Administered 2019-04-24: 0.25 mg via INTRAVENOUS

## 2019-04-24 MED ORDER — PALONOSETRON HCL INJECTION 0.25 MG/5ML
INTRAVENOUS | Status: AC
  Filled 2019-04-24: qty 5

## 2019-04-24 MED ORDER — SODIUM CHLORIDE 0.9 % IV SOLN
Freq: Once | INTRAVENOUS | Status: AC
  Administered 2019-04-24: 13:00:00 via INTRAVENOUS
  Filled 2019-04-24: qty 5

## 2019-04-24 MED ORDER — SODIUM CHLORIDE 0.9 % IV SOLN
600.0000 mg/m2 | Freq: Once | INTRAVENOUS | Status: AC
  Administered 2019-04-24: 1040 mg via INTRAVENOUS
  Filled 2019-04-24: qty 52

## 2019-04-24 MED ORDER — SODIUM CHLORIDE 0.9% FLUSH
10.0000 mL | INTRAVENOUS | Status: DC | PRN
  Administered 2019-04-24: 10 mL
  Filled 2019-04-24: qty 10

## 2019-04-24 NOTE — Patient Instructions (Signed)
[facility] Discharge Instructions for Patients Receiving Chemotherapy  Today you received the following chemotherapy agents: Adriamycin, Cytoxan  To help prevent nausea and vomiting after your treatment, we encourage you to take your nausea medication as directed.   If you develop nausea and vomiting that is not controlled by your nausea medication, call the clinic.   BELOW ARE SYMPTOMS THAT SHOULD BE REPORTED IMMEDIATELY:  *FEVER GREATER THAN 100.5 F  *CHILLS WITH OR WITHOUT FEVER  NAUSEA AND VOMITING THAT IS NOT CONTROLLED WITH YOUR NAUSEA MEDICATION  *UNUSUAL SHORTNESS OF BREATH  *UNUSUAL BRUISING OR BLEEDING  TENDERNESS IN MOUTH AND THROAT WITH OR WITHOUT PRESENCE OF ULCERS  *URINARY PROBLEMS  *BOWEL PROBLEMS  UNUSUAL RASH Items with * indicate a potential emergency and should be followed up as soon as possible.  Feel free to call the clinic should you have any questions or concerns. The clinic phone number is [phone].  Please show the CHEMO ALERT CARD at check-in to the Emergency Department and triage nurse.   

## 2019-04-24 NOTE — Telephone Encounter (Signed)
Ok to treat with ALT of 96 per MD review.

## 2019-04-24 NOTE — Progress Notes (Signed)
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 She was last seen here on 04/04/2019.   She continues on adjuvant chemotherapy consiting of doxorubicin and cyclophosphamide x4. Today is day 1 cycle 4.   She felt slightly more nauseated after the last cycle, but otherwise did well.  She notes that she did vomit once.     REVIEW OF SYSTEMS: Tonya Whitehead denies any fever or chills.  She is fatigued, though doing well to manage it.  She remains active with her children.  She wants to know if she should have her ovaries removed because her cousin said it could help prevent her cancer from coming back. She wants to know more about this.    Tonya Whitehead has no bowel/bladder changes, headaches, vision issues, chest pain, cough, shortness of breath, or any other concerns.  A detailed ROS was otherwise non contributory.     HISTORY OF CURRENT ILLNESS: From the original intake note:  Tonya Whitehead notes a history of reconstructive left breast surgery in 2002 while living in [place], [place]. That was for symmetry. The left breast accordingly had some scar tissue and she thought that was what she was noting until mid March, when she felt the left breast was changing more  and the nipple seemed to be sinking subtly.  She brought this to Dr Dahlia Bailiff attention and underwent bilateral diagnostic mammography with tomography and bilateral breast ultrasonography at [facility] on 12/01/2018 showing: breast density category C; findings concerning for extensive malignancy throughout the left breast predominately involving the upper- and lower-outer quadrants.  The mass was palpable within the upper outer left breast and there was left nipple retraction as well as indentation along the inferior margin of the left breast.  Targeted ultrasound showed three masses measuring 5.6 cm (2 o'clock position), 4.0 cm (close to the nipple and contiguous to the prior mass), and 3.3 cm (6 o'clock position.).  Also noted was a borderline left axillary lymph node adjacent to the left axillary artery (and so not easy to biopsy).  In the right breast was noted a 1.3 cm cyst.  On 12/08/2018 Tonya Whitehead proceeded to biopsy of 2 of the left breast areas in question. The pathology from this procedure ([id number]) showed: invasive mammary carcinoma, grade 2, in two cores (at 2 o'clock and 6 o'clock) with identical histomorphology; e-cadherin is negative, consistent with a lobular phenotype. Prognostic indicators significant for: estrogen receptor, 70% positive and progesterone receptor, 70% positive, both with strong staining intensity. Proliferation marker Ki67 at 3%. HER2 negative by immunohistochemistry (1+).  Note also the patient had a thyroid ultrasound 07/18/2018 showing bilateral solitary nodules, which were biopsied 08/09/2018.  Both showed follicular nodules, Bethesda category 2 (benign).  The patient's subsequent history is as detailed below.   PAST MEDICAL HISTORY: Past Medical History:  Diagnosis Date   Breast cancer ([place]) 12/08/2018   invasive  lobular   Family history of bladder cancer    Family history of colon cancer    Family history of lung cancer    Family history of  ovarian cancer     PAST SURGICAL HISTORY: Past Surgical History:  Procedure Laterality Date   ADENOIDECTOMY     as a child   BREAST EXCISIONAL BIOPSY Left 2002   reconstructive surgery on nipple to match right breast   IR IMAGING GUIDED PORT INSERTION  03/08/2019  Adenoidectomy as a child.   FAMILY HISTORY: Family History  Problem Relation Age of Onset   Lung cancer Mother 42       smoker   Ovarian cancer Mother 56   Colon cancer Maternal Aunt        32s   Bladder Cancer Paternal Grandfather        death was unrelated   Cancer Paternal Aunt        through [facility], blood cancer   As of May 2020 patient's father is alive at age 16. Patient's mother is also living at age 35. She has ovarian cancer, diagnosed in 2019, and lung cancer (stage IV), diagnosed in 2016. She tested negative for BRCA.  A maternal aunt had colon cancer in her 56s.  3 other siblings of the patient's mother have not had any cancer.  On the paternal side the paternal grandmother had bladder cancer.  A paternal great aunt died from cancer of the pancreas.     GYNECOLOGIC HISTORY:  No LMP recorded. Menarche: 43 years old Age at first live birth: 43 years old GX P 3 LMP every month, 5 total days with 2 heavy Contraceptive no, husband with vasectomy. HRT n/a  Hysterectomy? no BSO? 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 Husband Tonya Whitehead is a physical therapist. He is biologically [place] and was adopted by a [place] family.  The patient lives at home with her husband and their 3 children: Tonya Whitehead is 11, Tonya Whitehead is 38, and  Tonya Whitehead, 38.  The patient attends a [employer].    ADVANCED DIRECTIVES: Husband Tonya Whitehead is her HCPOA   HEALTH MAINTENANCE: Social History   Tobacco Use   Smoking status: Never Smoker   Smokeless tobacco: Never Used  Substance Use Topics   Alcohol use: Not on file   Drug use: Never     Colonoscopy: never done  PAP:  not on file  Bone density: never done   Allergies  Allergen Reactions   Gluten Meal Hives    Joint pain, upset stomach   Sulfa Antibiotics     Unknown reaction " I was told I had a reaction when I was a baby "    Current Outpatient Medications  Medication Sig Dispense Refill   ciprofloxacin (CIPRO) 500 MG tablet Take one tablet twice daily for 5 days starting day 8 after each chemotherapy treatment 40 tablet 1   dexamethasone (DECADRON) 4 MG tablet Take 2 tablets by mouth once a day on the day after chemotherapy and then take 2 tablets two times a day for 2 days. Take with food. 30 tablet 1   levothyroxine (SYNTHROID) 25 MCG tablet Take 1 tablet (25 mcg total) by mouth daily before breakfast. 60 tablet 1   lidocaine-prilocaine (EMLA) cream Apply to affected area once 30 g 3   loratadine (CLARITIN) 10 MG tablet Take 10 mg by mouth daily.     LORazepam (ATIVAN) 0.5 MG tablet Place 1  tablet (0.5 mg total) under the tongue every 6 (six) hours as needed (Nausea). 40 tablet 0   metoCLOPramide (REGLAN) 5 MG tablet Take 1 tablet (5 mg total) by mouth 4 (four) times daily -  before meals and at bedtime. 40 tablet 3   ondansetron (ZOFRAN-ODT) 4 MG disintegrating tablet      polyethylene glycol (MIRALAX / GLYCOLAX) 17 g packet Take 17 g by mouth daily as needed for mild constipation.     prochlorperazine (COMPAZINE) 10 MG tablet Take 1 tablet (10 mg total) by mouth every 6 (six) hours as needed (Nausea or vomiting). 30 tablet 1   promethazine (PHENERGAN) 25 MG tablet Take 1 tablet (25 mg total) by mouth every 6 (six) hours as needed for nausea or vomiting. 30 tablet 1   PROMETHEGAN 25 MG suppository INSERT 1 SUPPOSITORY RECTALLY EVERY 6 HOURS AS NEEDED FOR NAUSEA OR VOMITING *USE IF ACTIVELY VOMITING* 12 suppository 0   No current facility-administered medications for this visit.     OBJECTIVE: Young white woman in no acute distress  Vitals:   04/24/19 1127  BP: 115/79  Pulse: 84   Resp: 18  Temp: 98.7 F (37.1 C)  SpO2: 100%   Wt Readings from Last 3 Encounters:  04/24/19 138 lb (62.6 kg)  04/10/19 136 lb 14.4 oz (62.1 kg)  04/04/19 135 lb 6.4 oz (61.4 kg)   Body mass index is 23.69 kg/m.    ECOG FS:1 - Symptomatic but completely ambulatory GENERAL: Patient is a well appearing female in no acute distress HEENT:  Sclerae anicteric.  Oropharynx clear and moist. No ulcerations or evidence of oropharyngeal candidiasis. Neck is supple.  NODES:  No cervical, supraclavicular, or axillary lymphadenopathy palpated.  BREAST EXAM:  Deferred. LUNGS:  Clear to auscultation bilaterally.  No wheezes or rhonchi. HEART:  Regular rate and rhythm. No murmur appreciated. ABDOMEN:  Soft, nontender.  Positive, normoactive bowel sounds. No organomegaly palpated. MSK:  No focal spinal tenderness to palpation. Full range of motion bilaterally in the upper extremities. EXTREMITIES:  No peripheral edema.   SKIN:  Clear with no obvious rashes or skin changes. No nail dyscrasia. NEURO:  Nonfocal. Well oriented.  Appropriate affect.      LAB RESULTS:  CMP     Component Value Date/Time   NA 141 04/10/2019 0835   K 4.3 04/10/2019 0835   CL 109 04/10/2019 0835   CO2 25 04/10/2019 0835   GLUCOSE 121 (H) 04/10/2019 0835   BUN 10 04/10/2019 0835   CREATININE 0.69 04/10/2019 0835   CREATININE 0.85 03/20/2019 1022   CALCIUM 8.8 (L) 04/10/2019 0835   PROT 6.9 04/10/2019 0835   ALBUMIN 3.9 04/10/2019 0835   AST 45 (H) 04/10/2019 0835   AST 13 (L) 03/20/2019 1022   ALT 100 (H) 04/10/2019 0835   ALT 40 03/20/2019 1022   ALKPHOS 91 04/10/2019 0835   BILITOT <0.2 (L) 04/10/2019 0835   BILITOT 0.7 03/20/2019 1022   GFRNONAA >60 04/10/2019 0835   GFRNONAA >60 03/20/2019 1022   GFRAA >60 04/10/2019 0835   GFRAA >60 03/20/2019 1022    No results found for: TOTALPROTELP, ALBUMINELP, A1GS, A2GS, BETS, BETA2SER, GAMS, MSPIKE, SPEI  No results found for: KPAFRELGTCHN, LAMBDASER,  KAPLAMBRATIO  Lab Results  Component Value Date   WBC 17.2 (H) 04/24/2019   NEUTROABS PENDING 04/24/2019   HGB 11.0 (L) 04/24/2019   HCT 33.2 (L) 04/24/2019   MCV 92.7 04/24/2019   PLT 252 04/24/2019    _0 @  No results found for: LABCA2  No components found for: LKGMWN027  No results for input(s): INR in the last 168 hours.  No results found for: LABCA2  No results found for: OZD664  No results found for: QIH474  No results found for: QVZ563  No results found for: CA2729  No components found for: HGQUANT  No results found for: CEA1 / No results found for: CEA1   No results found for: AFPTUMOR  No results found for: CHROMOGRNA  No results found for: PSA1  Appointment on 04/24/2019  Component Date Value Ref Range Status   WBC 04/24/2019 17.2* 4.0 - 10.5 K/uL Final   RBC 04/24/2019 3.58* 3.87 - 5.11 MIL/uL Final   Hemoglobin 04/24/2019 11.0* 12.0 - 15.0 g/dL Final   HCT 04/24/2019 33.2* 36.0 - 46.0 % Final   MCV 04/24/2019 92.7  80.0 - 100.0 fL Final   MCH 04/24/2019 30.7  26.0 - 34.0 pg Final   MCHC 04/24/2019 33.1  30.0 - 36.0 g/dL Final   RDW 04/24/2019 16.3* 11.5 - 15.5 % Final   Platelets 04/24/2019 252  150 - 400 K/uL Final   nRBC 04/24/2019 0.3* 0.0 - 0.2 % Final   Performed at [facility], [address] [place], [place] [address]   Neutrophils Relative % 04/24/2019 PENDING  % Incomplete   Neutro Abs 04/24/2019 PENDING  1.7 - 7.7 K/uL Incomplete   Band Neutrophils 04/24/2019 PENDING  % Incomplete   Lymphocytes Relative 04/24/2019 PENDING  % Incomplete   Lymphs Abs 04/24/2019 PENDING  0.7 - 4.0 K/uL Incomplete   Monocytes Relative 04/24/2019 PENDING  % Incomplete   Monocytes Absolute 04/24/2019 PENDING  0.1 - 1.0 K/uL Incomplete   Eosinophils Relative 04/24/2019 PENDING  % Incomplete   Eosinophils Absolute 04/24/2019 PENDING  0.0 - 0.5 K/uL Incomplete   Basophils Relative 04/24/2019 PENDING  %  Incomplete   Basophils Absolute 04/24/2019 PENDING  0.0 - 0.1 K/uL Incomplete   WBC Morphology 04/24/2019 PENDING   Incomplete   RBC Morphology 04/24/2019 PENDING   Incomplete   Smear Review 04/24/2019 PENDING   Incomplete   Other 04/24/2019 PENDING  % Incomplete   nRBC 04/24/2019 PENDING  0 /100 WBC Incomplete   Metamyelocytes Relative 04/24/2019 PENDING  % Incomplete   Myelocytes 04/24/2019 PENDING  % Incomplete   Promyelocytes Relative 04/24/2019 PENDING  % Incomplete   Blasts 04/24/2019 PENDING  % Incomplete   Immature Granulocytes 04/24/2019 PENDING  % Incomplete   Abs Immature Granulocytes 04/24/2019 PENDING  0.00 - 0.07 K/uL Incomplete    (this displays the last labs from the last 3 days)  No results found for: TOTALPROTELP, ALBUMINELP, A1GS, A2GS, BETS, BETA2SER, GAMS, MSPIKE, SPEI (this displays SPEP labs)  No results found for: KPAFRELGTCHN, LAMBDASER, KAPLAMBRATIO (kappa/lambda light chains)  No results found for: HGBA, HGBA2QUANT, HGBFQUANT, HGBSQUAN (Hemoglobinopathy evaluation)   No results found for: LDH  No results found for: IRON, TIBC, IRONPCTSAT (Iron and TIBC)  No results found for: FERRITIN  Urinalysis No results found for: COLORURINE, APPEARANCEUR, LABSPEC, PHURINE, GLUCOSEU, HGBUR, BILIRUBINUR, KETONESUR, PROTEINUR, UROBILINOGEN, NITRITE, LEUKOCYTESUR   STUDIES: No results found.   ELIGIBLE FOR AVAILABLE RESEARCH PROTOCOL: no  ASSESSMENT: 43 y.o. [place] woman status post left breast biopsy x2 on 12/08/2018, for multicentric invasive lobular breast cancer, grade 2, estrogen and progesterone receptor positive, HER-2 nonamplified, with an MIB-1 of 3%  (a) bilateral breast MRI 12/20/2018 shows multiple masses throughout the left breast, with nipple retraction, but no evidence of  chest wall invasion, no abnormal left axillary lymph nodes, and no findings of concern in the right breast  (b) chest CT scan and bone scan 12/29/2018 show only  an incidental goiter  (1) status post left skin sparing mastectomy at [facility] 01/16/2019 for an mpT3 pN1, stage IIA invasive lobular carcinoma, grade 2; repeat prognostic panel again estrogen and progesterone receptor strongly positive, HER-2 nonamplified.  (a) a total of 8 lymph nodes removed, one with macrometastasis (8 mm, no ECE)  (2) adjuvant chemotherapy will consist of doxorubicin and cyclophosphamide in dose dense fashion x4 starting 03/13/2019, to be followed by weekly paclitaxel x12  (a) echo 03/09/2019 finds normal left ventricular function at 55-60%  (b) cycle 2 doxorubicin and cyclophosphamide delayed 1 week secondary to nausea problems  (3) adjuvant radiation to follow  (4) antiestrogens to start at the completion of local treatment  (5) genetics testing 01/09/2019 through the [facility] STAT Breast Cancer Panel + Common Hereditary Cancers Panel found no deleterious mutations in ATM, BRCA1, BRCA2, CDH1, CHEK2, PALB2, PTEN, STK11 and TP53, APC, ATM, AXIN2, BARD1, BMPR1A, BRCA1, BRCA2, BRIP1, CDH1, CDKN2A (p14ARF), CDKN2A (p16INK4a), CKD4, CHEK2, CTNNA1, DICER1, EPCAM (Deletion/duplication testing only), GREM1 (promoter region deletion/duplication testing only), KIT, MEN1, MLH1, MSH2, MSH3, MSH6, MUTYH, NBN, NF1, NHTL1, PALB2, PDGFRA, PMS2, POLD1, POLE, PTEN, RAD50, RAD51C, RAD51D SDHB, SDHC, SDHD, SMAD4, SMARCA4. STK11, TP53, TSC1, TSC2, and VHL.  The following genes were evaluated for sequence changes only: SDHA and HOXB13 c.251G>A variant only.   PLAN: Elwyn is doing well today.  Her CBC is stable and she is ready to finish out #4 of her doxorubicin and cyclophsophamide treatments.  We reviewed her liver enzymes and them being elevated, however that is possibly irritation from the chemo, and will likely decrease.    Camellia and I reviewed the issue of her ovaries.  I reviewed with her the difference between ovarian suppression and anastrozole versus tamoxifen in premenopausal women.  We  discussed surgical ovarian suppression, and pharmacologic ovarian suppression.  After discussion, I encouraged her to do some research on the issues, so that when it is time to discuss with Dr. Jana Hakim she will have a better understanding.    Keyasia will return in two weeks for labs, f/u, and to start her weekly Paclitaxel.  I recommended that so long is she is receiving chemotherapy, that she work from home if possible during the [place] pandemic.  She understands.  She was recommended to continue with the appropriate pandemic precautions. She knows to call for any questions that may arise between now and her next appointment.  We are happy to see her sooner if needed.  A total of (30) minutes of face-to-face time was spent with this patient with greater than 50% of that time in counseling and care-coordination.   Wilber Bihari, NP  04/24/19 11:41 AM Medical Oncology and Hematology [facility] [place], [place] [address] Tel. [phone]    Fax. [phone]

## 2019-04-26 ENCOUNTER — Inpatient Hospital Stay: Payer: 59 | Attending: Oncology

## 2019-04-26 ENCOUNTER — Other Ambulatory Visit: Payer: Self-pay

## 2019-04-26 VITALS — BP 97/65 | HR 94 | Temp 97.8°F | Resp 18

## 2019-04-26 DIAGNOSIS — Z90722 Acquired absence of ovaries, bilateral: Secondary | ICD-10-CM | POA: Diagnosis not present

## 2019-04-26 DIAGNOSIS — Z79899 Other long term (current) drug therapy: Secondary | ICD-10-CM | POA: Insufficient documentation

## 2019-04-26 DIAGNOSIS — C50412 Malignant neoplasm of upper-outer quadrant of left female breast: Secondary | ICD-10-CM | POA: Insufficient documentation

## 2019-04-26 DIAGNOSIS — Z5111 Encounter for antineoplastic chemotherapy: Secondary | ICD-10-CM | POA: Diagnosis present

## 2019-04-26 DIAGNOSIS — Z9012 Acquired absence of left breast and nipple: Secondary | ICD-10-CM | POA: Insufficient documentation

## 2019-04-26 DIAGNOSIS — Z170 Estrogen receptor positive status [ER+]: Secondary | ICD-10-CM | POA: Insufficient documentation

## 2019-04-26 DIAGNOSIS — C50812 Malignant neoplasm of overlapping sites of left female breast: Secondary | ICD-10-CM

## 2019-04-26 MED ORDER — HEPARIN SOD (PORK) LOCK FLUSH 100 UNIT/ML IV SOLN
500.0000 [IU] | Freq: Once | INTRAVENOUS | Status: AC | PRN
  Administered 2019-04-26: 500 [IU]
  Filled 2019-04-26: qty 5

## 2019-04-26 MED ORDER — PROCHLORPERAZINE EDISYLATE 10 MG/2ML IJ SOLN
INTRAMUSCULAR | Status: AC
  Filled 2019-04-26: qty 2

## 2019-04-26 MED ORDER — SODIUM CHLORIDE 0.9% FLUSH
10.0000 mL | Freq: Once | INTRAVENOUS | Status: AC | PRN
  Administered 2019-04-26: 10 mL
  Filled 2019-04-26: qty 10

## 2019-04-26 MED ORDER — SODIUM CHLORIDE 0.9 % IV SOLN
INTRAVENOUS | Status: AC
  Administered 2019-04-26: 09:00:00 via INTRAVENOUS
  Filled 2019-04-26 (×2): qty 250

## 2019-04-26 MED ORDER — PROCHLORPERAZINE EDISYLATE 10 MG/2ML IJ SOLN
10.0000 mg | Freq: Once | INTRAMUSCULAR | Status: AC | PRN
  Administered 2019-04-26: 10 mg via INTRAVENOUS
  Filled 2019-04-26: qty 2

## 2019-04-26 NOTE — Patient Instructions (Signed)
Nausea and Vomiting, Adult Nausea is the feeling that you have an upset stomach or that you are about to vomit. Vomiting is when stomach contents are thrown up and out of the mouth as a result of nausea. Vomiting can make you feel weak and cause you to become dehydrated. Dehydration can make you feel tired and thirsty, cause you to have a dry mouth, and decrease how often you urinate. Older adults and people with other diseases or a weak disease-fighting system (immune system) are at higher risk for dehydration. It is important to treat your nausea and vomiting as told by your health care provider. Follow these instructions at home: Watch your symptoms for any changes. Tell your health care provider about them. Follow these instructions to care for yourself at home. Eating and drinking      Take an oral rehydration solution (ORS). This is a drink that is sold at pharmacies and retail stores.  Drink clear fluids slowly and in small amounts as you are able. Clear fluids include water, ice chips, low-calorie sports drinks, and fruit juice that has water added (diluted fruit juice).  Eat bland, easy-to-digest foods in small amounts as you are able. These foods include bananas, applesauce, rice, lean meats, toast, and crackers.  Avoid fluids that contain a lot of sugar or caffeine, such as energy drinks, sports drinks, and soda.  Avoid alcohol.  Avoid spicy or fatty foods. General instructions  Take over-the-counter and prescription medicines only as told by your health care provider.  Drink enough fluid to keep your urine pale yellow.  Wash your hands often using soap and water. If soap and water are not available, use hand sanitizer.  Make sure that all people in your household wash their hands well and often.  Rest at home while you recover.  Watch your condition for any changes.  Breathe slowly and deeply when you feel nauseated.  Keep all follow-up visits as told by your health  care provider. This is important. Contact a health care provider if:  Your symptoms get worse.  You have new symptoms.  You have a fever.  You cannot drink fluids without vomiting.  Your nausea does not go away after 2 days.  You feel light-headed or dizzy.  You have a headache.  You have muscle cramps.  You have a rash.  You have pain while urinating. Get help right away if:  You have pain in your chest, neck, arm, or jaw.  You feel extremely weak or you faint.  You have persistent vomiting.  You have vomit that is bright red or looks like black coffee grounds.  You have bloody or black stools or stools that look like tar.  You have a severe headache, a stiff neck, or both.  You have severe pain, cramping, or bloating in your abdomen.  You have difficulty breathing, or you are breathing very quickly.  Your heart is beating very quickly.  Your skin feels cold and clammy.  You feel confused.  You have signs of dehydration, such as: ? Dark urine, very little urine, or no urine. ? Cracked lips. ? Dry mouth. ? Sunken eyes. ? Sleepiness. ? Weakness. These symptoms may represent a serious problem that is an emergency. Do not wait to see if the symptoms will go away. Get medical help right away. Call your local emergency services (911 in the [place] Do not drive yourself to the hospital. Summary  Nausea is the feeling that you have an upset stomach  or that you are about to vomit. As nausea gets worse, it can lead to vomiting. Vomiting can make you feel weak and cause you to become dehydrated.  Follow instructions from your health care provider about eating and drinking to prevent dehydration.  Take over-the-counter and prescription medicines only as told by your health care provider.  Contact your health care provider if your symptoms get worse, or you have new symptoms.  Keep all follow-up visits as told by your health care provider. This is important. This  information is not intended to replace advice given to you by your health care provider. Make sure you discuss any questions you have with your health care provider. Document Released: 07/12/2005 Document Revised: 11/03/2018 Document Reviewed: 12/20/2017 Elsevier Patient Education  [place].   Rehydration, Adult Rehydration is the replacement of body fluids and salts and minerals (electrolytes) that are lost during dehydration. Dehydration is when there is not enough fluid or water in the body. This happens when you lose more fluids than you take in. Common causes of dehydration include:  Vomiting.  Diarrhea.  Excessive sweating, such as from heat exposure or exercise.  Taking medicines that cause the body to lose excess fluid (diuretics).  Impaired kidney function.  Not drinking enough fluid.  Certain illnesses or infections.  Certain poorly controlled long-term (chronic) illnesses, such as diabetes, heart disease, and kidney disease.  Symptoms of mild dehydration may include thirst, dry lips and mouth, dry skin, and dizziness. Symptoms of severe dehydration may include increased heart rate, confusion, fainting, and not urinating. You can rehydrate by drinking certain fluids or getting fluids through an IV tube, as told by your health care provider. What are the risks? Generally, rehydration is safe. However, one problem that can happen is taking in too much fluid (overhydration). This is rare. If overhydration happens, it can cause an electrolyte imbalance, kidney failure, or a decrease in salt (sodium) levels in the body. How to rehydrate Follow instructions from your health care provider for rehydration. The kind of fluid you should drink and the amount you should drink depend on your condition.  If directed by your health care provider, drink an oral rehydration solution (ORS). This is a drink designed to treat dehydration that is found in pharmacies and retail stores.  ? Make an ORS by following instructions on the package. ? Start by drinking small amounts, about  cup (120 mL) every 5-10 minutes. ? Slowly increase how much you drink until you have taken the amount recommended by your health care provider.  Drink enough clear fluids to keep your urine clear or pale yellow. If you were instructed to drink an ORS, finish the ORS first, then start slowly drinking other clear fluids. Drink fluids such as: ? Water. Do not drink only water. Doing that can lead to having too little sodium in your body (hyponatremia). ? Ice chips. ? Fruit juice that you have added water to (diluted juice). ? Low-calorie sports drinks.  If you are severely dehydrated, your health care provider may recommend that you receive fluids through an IV tube in the hospital.  Do not take sodium tablets. Doing that can lead to the condition of having too much sodium in your body (hypernatremia). Eating while you rehydrate Follow instructions from your health care provider about what to eat while you rehydrate. Your health care provider may recommend that you slowly begin eating regular foods in small amounts.  Eat foods that contain a healthy balance of  electrolytes, such as bananas, oranges, potatoes, tomatoes, and spinach.  Avoid foods that are greasy or contain a lot of fat or sugar.  In some cases, you may get nutrition through a feeding tube that is passed through your nose and into your stomach (nasogastric tube, or NG tube). This may be done if you have uncontrolled vomiting or diarrhea. Beverages to avoid Certain beverages may make dehydration worse. While you rehydrate, avoid:  Alcohol.  Caffeine.  Drinks that contain a lot of sugar. These include: ? High-calorie sports drinks. ? Fruit juice that is not diluted. ? Soda.  Check nutrition labels to see how much sugar or caffeine a beverage contains. Signs of dehydration recovery You may be recovering from dehydration if:   You are urinating more often than before you started rehydrating.  Your urine is clear or pale yellow.  Your energy level improves.  You vomit less frequently.  You have diarrhea less frequently.  Your appetite improves or returns to normal.  You feel less dizzy or less light-headed.  Your skin tone and color start to look more normal. Contact a health care provider if:  You continue to have symptoms of mild dehydration, such as: ? Thirst. ? Dry lips. ? Slightly dry mouth. ? Dry, warm skin. ? Dizziness.  You continue to vomit or have diarrhea. Get help right away if:  You have symptoms of dehydration that get worse.  You feel: ? Confused. ? Weak. ? Like you are going to faint.  You have not urinated in 6-8 hours.  You have very dark urine.  You have trouble breathing.  Your heart rate while sitting still is over 100 beats a minute.  You cannot drink fluids without vomiting.  You have vomiting or diarrhea that: ? Gets worse. ? Does not go away.  You have a fever. This information is not intended to replace advice given to you by your health care provider. Make sure you discuss any questions you have with your health care provider. Document Released: 10/04/2011 Document Revised: 06/24/2017 Document Reviewed: 09/05/2015 Elsevier Patient Education  2020 [employer].

## 2019-04-27 ENCOUNTER — Ambulatory Visit: Payer: 59

## 2019-04-28 ENCOUNTER — Inpatient Hospital Stay: Payer: 59

## 2019-04-30 ENCOUNTER — Ambulatory Visit: Payer: 59

## 2019-05-09 ENCOUNTER — Inpatient Hospital Stay (HOSPITAL_BASED_OUTPATIENT_CLINIC_OR_DEPARTMENT_OTHER): Payer: 59 | Admitting: Adult Health

## 2019-05-09 ENCOUNTER — Other Ambulatory Visit: Payer: Self-pay | Admitting: Oncology

## 2019-05-09 ENCOUNTER — Encounter: Payer: Self-pay | Admitting: Adult Health

## 2019-05-09 ENCOUNTER — Other Ambulatory Visit: Payer: Self-pay

## 2019-05-09 ENCOUNTER — Inpatient Hospital Stay: Payer: 59

## 2019-05-09 ENCOUNTER — Encounter: Payer: Self-pay | Admitting: *Deleted

## 2019-05-09 VITALS — BP 98/63 | HR 81 | Temp 97.8°F | Resp 18 | Ht 64.0 in | Wt 137.8 lb

## 2019-05-09 VITALS — BP 102/70 | HR 69 | Temp 98.8°F | Resp 18

## 2019-05-09 DIAGNOSIS — Z170 Estrogen receptor positive status [ER+]: Secondary | ICD-10-CM

## 2019-05-09 DIAGNOSIS — C50812 Malignant neoplasm of overlapping sites of left female breast: Secondary | ICD-10-CM | POA: Diagnosis not present

## 2019-05-09 DIAGNOSIS — Z5111 Encounter for antineoplastic chemotherapy: Secondary | ICD-10-CM | POA: Diagnosis not present

## 2019-05-09 LAB — COMPREHENSIVE METABOLIC PANEL
ALT: 73 U/L — ABNORMAL HIGH (ref 0–44)
AST: 28 U/L (ref 15–41)
Albumin: 3.8 g/dL (ref 3.5–5.0)
Alkaline Phosphatase: 98 U/L (ref 38–126)
Anion gap: 9 (ref 5–15)
BUN: 11 mg/dL (ref 6–20)
CO2: 27 mmol/L (ref 22–32)
Calcium: 9.1 mg/dL (ref 8.9–10.3)
Chloride: 104 mmol/L (ref 98–111)
Creatinine, Ser: 0.7 mg/dL (ref 0.44–1.00)
GFR calc Af Amer: >60 mL/min (ref >60)
GFR calc non Af Amer: >60 mL/min (ref >60)
Glucose, Bld: 114 mg/dL — ABNORMAL HIGH (ref 70–99)
Potassium: 4.1 mmol/L (ref 3.5–5.1)
Sodium: 140 mmol/L (ref 135–145)
Total Bilirubin: 0.2 mg/dL — ABNORMAL LOW (ref 0.3–1.2)
Total Protein: 6.7 g/dL (ref 6.5–8.1)

## 2019-05-09 LAB — CBC WITH DIFFERENTIAL/PLATELET
Abs Immature Granulocytes: 0.53 10*3/uL — ABNORMAL HIGH (ref 0.00–0.07)
Basophils Absolute: 0.1 10*3/uL (ref 0.0–0.1)
Basophils Relative: 1 %
Eosinophils Absolute: 0 10*3/uL (ref 0.0–0.5)
Eosinophils Relative: 0 %
HCT: 32.1 % — ABNORMAL LOW (ref 36.0–46.0)
Hemoglobin: 10.6 g/dL — ABNORMAL LOW (ref 12.0–15.0)
Immature Granulocytes: 5 %
Lymphocytes Relative: 5 %
Lymphs Abs: 0.5 10*3/uL — ABNORMAL LOW (ref 0.7–4.0)
MCH: 31.2 pg (ref 26.0–34.0)
MCHC: 33 g/dL (ref 30.0–36.0)
MCV: 94.4 fL (ref 80.0–100.0)
Monocytes Absolute: 0.8 10*3/uL (ref 0.1–1.0)
Monocytes Relative: 8 %
Neutro Abs: 8.2 10*3/uL — ABNORMAL HIGH (ref 1.7–7.7)
Neutrophils Relative %: 81 %
Platelets: 198 10*3/uL (ref 150–400)
RBC: 3.4 MIL/uL — ABNORMAL LOW (ref 3.87–5.11)
RDW: 18.2 % — ABNORMAL HIGH (ref 11.5–15.5)
WBC: 10 10*3/uL (ref 4.0–10.5)
nRBC: 0 % (ref 0.0–0.2)

## 2019-05-09 LAB — URINALYSIS, COMPLETE (UACMP) WITH MICROSCOPIC
Bilirubin Urine: NEGATIVE
Glucose, UA: NEGATIVE mg/dL
Hgb urine dipstick: NEGATIVE
Ketones, ur: NEGATIVE mg/dL
Leukocytes,Ua: NEGATIVE
Nitrite: NEGATIVE
Protein, ur: NEGATIVE mg/dL
Specific Gravity, Urine: 1.003 — ABNORMAL LOW (ref 1.005–1.030)
pH: 6 (ref 5.0–8.0)

## 2019-05-09 MED ORDER — SODIUM CHLORIDE 0.9% FLUSH
10.0000 mL | Freq: Once | INTRAVENOUS | Status: AC | PRN
  Administered 2019-05-09: 10 mL
  Filled 2019-05-09: qty 10

## 2019-05-09 MED ORDER — FAMOTIDINE IN NACL 20-0.9 MG/50ML-% IV SOLN
20.0000 mg | Freq: Once | INTRAVENOUS | Status: AC
  Administered 2019-05-09: 20 mg via INTRAVENOUS

## 2019-05-09 MED ORDER — SODIUM CHLORIDE 0.9% FLUSH
10.0000 mL | INTRAVENOUS | Status: DC | PRN
  Administered 2019-05-09: 10 mL
  Filled 2019-05-09: qty 10

## 2019-05-09 MED ORDER — HEPARIN SOD (PORK) LOCK FLUSH 100 UNIT/ML IV SOLN
500.0000 [IU] | Freq: Once | INTRAVENOUS | Status: AC | PRN
  Administered 2019-05-09: 15:00:00 500 [IU]
  Filled 2019-05-09: qty 5

## 2019-05-09 MED ORDER — SODIUM CHLORIDE 0.9 % IV SOLN
80.0000 mg/m2 | Freq: Once | INTRAVENOUS | Status: AC
  Administered 2019-05-09: 138 mg via INTRAVENOUS
  Filled 2019-05-09: qty 23

## 2019-05-09 MED ORDER — SODIUM CHLORIDE 0.9 % IV SOLN
Freq: Once | INTRAVENOUS | Status: AC
  Administered 2019-05-09: 11:00:00 via INTRAVENOUS
  Filled 2019-05-09: qty 250

## 2019-05-09 MED ORDER — DIPHENHYDRAMINE HCL 50 MG/ML IJ SOLN
50.0000 mg | Freq: Once | INTRAMUSCULAR | Status: AC
  Administered 2019-05-09: 50 mg via INTRAVENOUS

## 2019-05-09 MED ORDER — SODIUM CHLORIDE 0.9 % IV SOLN
20.0000 mg | Freq: Once | INTRAVENOUS | Status: AC
  Administered 2019-05-09: 20 mg via INTRAVENOUS
  Filled 2019-05-09: qty 20

## 2019-05-09 MED ORDER — PALONOSETRON HCL INJECTION 0.25 MG/5ML
0.2500 mg | Freq: Once | INTRAVENOUS | Status: AC
  Administered 2019-05-09: 0.25 mg via INTRAVENOUS

## 2019-05-09 MED ORDER — DIPHENHYDRAMINE HCL 50 MG/ML IJ SOLN
INTRAMUSCULAR | Status: AC
  Filled 2019-05-09: qty 1

## 2019-05-09 MED ORDER — FAMOTIDINE IN NACL 20-0.9 MG/50ML-% IV SOLN
INTRAVENOUS | Status: AC
  Filled 2019-05-09: qty 50

## 2019-05-09 MED ORDER — PALONOSETRON HCL INJECTION 0.25 MG/5ML
INTRAVENOUS | Status: AC
  Filled 2019-05-09: qty 5

## 2019-05-09 NOTE — Progress Notes (Signed)
Cryotherapy applied to feet and hands for First time Taxol.

## 2019-05-09 NOTE — Progress Notes (Signed)
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   She has completed four cycles of adjuvant Doxorubicin and Cyclophosphamide, and is due to start Paclitaxel today.  Levita is feeling well and has no issues today.    REVIEW OF SYSTEMS: Chariah is looking forward to being one step closer to completing her adjvuant chemotherapy.  She has recovered well from the Doxorubicin and Cyclosphosphamide and has no complaints.  She is without any fever or chills.  She has no cough, shortness of breath, palpitations.  She is without nausea, vomiting, bowel/bladder changes.  A detailed ROS was non contributory.    HISTORY OF CURRENT ILLNESS: From the original intake note:  Maddix Barbar notes a history of reconstructive left breast surgery in 2002 while living in [place], [place]. That was for symmetry. The left breast accordingly had some scar tissue and she thought that was what she was noting until mid March, when she felt the left breast was changing more and the nipple seemed to be sinking subtly.  She brought this to Dr Dahlia Bailiff attention and underwent bilateral diagnostic mammography with tomography and bilateral breast ultrasonography at [facility] on  12/01/2018 showing: breast density category C; findings concerning for extensive malignancy throughout the left breast predominately involving the upper- and lower-outer quadrants.  The mass was palpable within the upper outer left breast and there was left nipple retraction as well as indentation along the inferior margin of the left breast.  Targeted ultrasound showed three masses measuring 5.6 cm (2 o'clock position), 4.0 cm (close to the nipple and contiguous to the prior mass), and 3.3 cm (6 o'clock position.).  Also noted was a borderline left axillary lymph node adjacent to the left axillary artery (and so not easy to biopsy).  In the right breast was noted a 1.3 cm cyst.  On 12/08/2018 Starlynn proceeded to biopsy of 2 of the left breast areas in question. The pathology from this procedure ([id number]) showed: invasive mammary carcinoma, grade 2, in two cores (at 2 o'clock and 6 o'clock) with identical histomorphology; e-cadherin is negative, consistent with a lobular phenotype. Prognostic indicators significant for: estrogen receptor, 70% positive and progesterone receptor, 70% positive, both with strong staining intensity. Proliferation marker Ki67 at 3%. HER2 negative by immunohistochemistry (1+).  Note also the patient had a thyroid ultrasound 07/18/2018 showing bilateral solitary nodules, which were biopsied 08/09/2018.  Both showed follicular nodules, Bethesda category 2 (benign).  The patient's subsequent history is as detailed below.   PAST MEDICAL HISTORY: Past Medical History:  Diagnosis Date  . Breast cancer ([place]) 12/08/2018   invasive lobular  . Family history of bladder cancer   . Family history of colon cancer   . Family history of lung cancer   . Family history of ovarian cancer     PAST SURGICAL HISTORY: Past Surgical History:  Procedure Laterality Date  .  ADENOIDECTOMY     as a child  . BREAST EXCISIONAL BIOPSY Left 2002   reconstructive surgery on nipple to match  right breast  . IR IMAGING GUIDED PORT INSERTION  03/08/2019  Adenoidectomy as a child.   FAMILY HISTORY: Family History  Problem Relation Age of Onset  . Lung cancer Mother 49       smoker  . Ovarian cancer Mother 23  . Colon cancer Maternal Aunt        50s  . Bladder Cancer Paternal Grandfather        death was unrelated  . Cancer Paternal Aunt        through [facility], blood cancer   As of May 2020 patient's father is alive at age 28. Patient's mother is also living at age 46. She has ovarian cancer, diagnosed in 2019, and lung cancer (stage IV), diagnosed in 2016. She tested negative for BRCA.  A maternal aunt had colon cancer in her 18s.  3 other siblings of the patient's mother have not had any cancer.  On the paternal side the paternal grandmother had bladder cancer.  A paternal great aunt died from cancer of the pancreas.     GYNECOLOGIC HISTORY:  No LMP recorded. Menarche: 43 years old Age at first live birth: 43 years old GX P 3 LMP every month, 5 total days with 2 heavy Contraceptive no, husband with vasectomy. HRT n/a  Hysterectomy? no BSO? 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 Husband [name] is a physical therapist. He is biologically [place] and was adopted by a [place] family.  The patient lives at home with her husband and their 3 children: [name] is 9, [name] is 64, and  [name], 63.  The patient attends a [employer].    ADVANCED DIRECTIVES: Husband [name] is her HCPOA   HEALTH MAINTENANCE: Social History   Tobacco Use  . Smoking status: Never Smoker  . Smokeless tobacco: Never Used  Substance Use Topics  . Alcohol use: Not on file  . Drug use: Never     Colonoscopy: never done  PAP: not on file  Bone density: never done   Allergies  Allergen Reactions  . Gluten Meal Hives    Joint pain, upset stomach  . Sulfa Antibiotics     Unknown reaction " I was told I had a reaction when I was a  baby "    Current Outpatient Medications  Medication Sig Dispense Refill  . ciprofloxacin (CIPRO) 500 MG tablet Take one tablet twice daily for 5 days starting day 8 after each chemotherapy treatment 40 tablet 1  . dexamethasone (DECADRON) 4 MG tablet Take 2 tablets by mouth once a day on the day after chemotherapy and then take 2 tablets two times a day for 2 days. Take with food. 30 tablet 1  . levothyroxine (SYNTHROID) 25 MCG tablet Take 1 tablet (25 mcg total) by mouth daily before breakfast. 60 tablet 1  . lidocaine-prilocaine (EMLA) cream Apply to affected area once 30 g 3  . loratadine (CLARITIN) 10 MG tablet Take 10 mg by mouth daily.    Marland Kitchen LORazepam (ATIVAN) 0.5 MG tablet Place 1 tablet (0.5 mg total) under the tongue every 6 (six) hours as needed (Nausea). 40 tablet 0  . metoCLOPramide (REGLAN) 5 MG tablet Take 1 tablet (5 mg total) by mouth 4 (four) times daily -  before meals and at bedtime. 40 tablet 3  .  ondansetron (ZOFRAN-ODT) 4 MG disintegrating tablet     . polyethylene glycol (MIRALAX / GLYCOLAX) 17 g packet Take 17 g by mouth daily as needed for mild constipation.    . prochlorperazine (COMPAZINE) 10 MG tablet Take 1 tablet (10 mg total) by mouth every 6 (six) hours as needed (Nausea or vomiting). 30 tablet 1  . promethazine (PHENERGAN) 25 MG tablet Take 1 tablet (25 mg total) by mouth every 6 (six) hours as needed for nausea or vomiting. 30 tablet 1  . PROMETHEGAN 25 MG suppository INSERT 1 SUPPOSITORY RECTALLY EVERY 6 HOURS AS NEEDED FOR NAUSEA OR VOMITING *USE IF ACTIVELY VOMITING* 12 suppository 0   No current facility-administered medications for this visit.     OBJECTIVE:   Vitals:   05/09/19 1024  BP: 98/63  Pulse: 81  Resp: 18  Temp: 97.8 F (36.6 C)  SpO2: 100%   Wt Readings from Last 3 Encounters:  05/09/19 137 lb 12.8 oz (62.5 kg)  04/24/19 138 lb (62.6 kg)  04/10/19 136 lb 14.4 oz (62.1 kg)   Body mass index is 23.65 kg/m.    ECOG FS:1 -  Symptomatic but completely ambulatory GENERAL: Patient is a well appearing female in no acute distress HEENT:  Sclerae anicteric.  Oropharynx clear and moist. No ulcerations or evidence of oropharyngeal candidiasis. Neck is supple.  NODES:  No cervical, supraclavicular, or axillary lymphadenopathy palpated.  BREAST EXAM:  Deferred. LUNGS:  Clear to auscultation bilaterally.  No wheezes or rhonchi. HEART:  Regular rate and rhythm. No murmur appreciated. ABDOMEN:  Soft, nontender.  Positive, normoactive bowel sounds. No organomegaly palpated. MSK:  No focal spinal tenderness to palpation. Full range of motion bilaterally in the upper extremities. EXTREMITIES:  No peripheral edema.   SKIN:  Clear with no obvious rashes or skin changes. No nail dyscrasia. NEURO:  Nonfocal. Well oriented.  Appropriate affect.      LAB RESULTS:  CMP     Component Value Date/Time   NA 140 05/09/2019 1013   K 4.1 05/09/2019 1013   CL 104 05/09/2019 1013   CO2 27 05/09/2019 1013   GLUCOSE 114 (H) 05/09/2019 1013   BUN 11 05/09/2019 1013   CREATININE 0.70 05/09/2019 1013   CREATININE 0.85 03/20/2019 1022   CALCIUM 9.1 05/09/2019 1013   PROT 6.7 05/09/2019 1013   ALBUMIN 3.8 05/09/2019 1013   AST 28 05/09/2019 1013   AST 13 (L) 03/20/2019 1022   ALT 73 (H) 05/09/2019 1013   ALT 40 03/20/2019 1022   ALKPHOS 98 05/09/2019 1013   BILITOT 0.2 (L) 05/09/2019 1013   BILITOT 0.7 03/20/2019 1022   GFRNONAA >60 05/09/2019 1013   GFRNONAA >60 03/20/2019 1022   GFRAA >60 05/09/2019 1013   GFRAA >60 03/20/2019 1022    No results found for: TOTALPROTELP, ALBUMINELP, A1GS, A2GS, BETS, BETA2SER, GAMS, MSPIKE, SPEI  No results found for: KPAFRELGTCHN, LAMBDASER, KAPLAMBRATIO  Lab Results  Component Value Date   WBC 10.0 05/09/2019   NEUTROABS 8.2 (H) 05/09/2019   HGB 10.6 (L) 05/09/2019   HCT 32.1 (L) 05/09/2019   MCV 94.4 05/09/2019   PLT 198 05/09/2019    _0 @  No results found for:  LABCA2  No components found for: ZOXWRU045  No results for input(s): INR in the last 168 hours.  No results found for: LABCA2  No results found for: WUJ811  No results found for: BJY782  No results found for: NFA213  No results found for: CA2729  No components  found for: HGQUANT  No results found for: CEA1 / No results found for: CEA1   No results found for: AFPTUMOR  No results found for: [place]  No results found for: PSA1  Appointment on 05/09/2019  Component Date Value Ref Range Status  . Sodium 05/09/2019 140  135 - 145 mmol/L Final  . Potassium 05/09/2019 4.1  3.5 - 5.1 mmol/L Final  . Chloride 05/09/2019 104  98 - 111 mmol/L Final  . CO2 05/09/2019 27  22 - 32 mmol/L Final  . Glucose, Bld 05/09/2019 114* 70 - 99 mg/dL Final  . BUN 05/09/2019 11  6 - 20 mg/dL Final  . Creatinine, Ser 05/09/2019 0.70  0.44 - 1.00 mg/dL Final  . Calcium 05/09/2019 9.1  8.9 - 10.3 mg/dL Final  . Total Protein 05/09/2019 6.7  6.5 - 8.1 g/dL Final  . Albumin 05/09/2019 3.8  3.5 - 5.0 g/dL Final  . AST 05/09/2019 28  15 - 41 U/L Final  . ALT 05/09/2019 73* 0 - 44 U/L Final  . Alkaline Phosphatase 05/09/2019 98  38 - 126 U/L Final  . Total Bilirubin 05/09/2019 0.2* 0.3 - 1.2 mg/dL Final  . GFR calc non Af Amer 05/09/2019 >60  >60 mL/min Final  . GFR calc Af Amer 05/09/2019 >60  >60 mL/min Final  . Anion gap 05/09/2019 9  5 - 15 Final   Performed at [facility], [place] [address], [place], [place] [address]  . WBC 05/09/2019 10.0  4.0 - 10.5 K/uL Final  . RBC 05/09/2019 3.40* 3.87 - 5.11 MIL/uL Final  . Hemoglobin 05/09/2019 10.6* 12.0 - 15.0 g/dL Final  . HCT 05/09/2019 32.1* 36.0 - 46.0 % Final  . MCV 05/09/2019 94.4  80.0 - 100.0 fL Final  . MCH 05/09/2019 31.2  26.0 - 34.0 pg Final  . MCHC 05/09/2019 33.0  30.0 - 36.0 g/dL Final  . RDW 05/09/2019 18.2* 11.5 - 15.5 % Final  . Platelets 05/09/2019 198  150 - 400 K/uL Final  . nRBC 05/09/2019 0.0  0.0 -  0.2 % Final  . Neutrophils Relative % 05/09/2019 81  % Final  . Neutro Abs 05/09/2019 8.2* 1.7 - 7.7 K/uL Final  . Lymphocytes Relative 05/09/2019 5  % Final  . Lymphs Abs 05/09/2019 0.5* 0.7 - 4.0 K/uL Final  . Monocytes Relative 05/09/2019 8  % Final  . Monocytes Absolute 05/09/2019 0.8  0.1 - 1.0 K/uL Final  . Eosinophils Relative 05/09/2019 0  % Final  . Eosinophils Absolute 05/09/2019 0.0  0.0 - 0.5 K/uL Final  . Basophils Relative 05/09/2019 1  % Final  . Basophils Absolute 05/09/2019 0.1  0.0 - 0.1 K/uL Final  . Immature Granulocytes 05/09/2019 5  % Final  . Abs Immature Granulocytes 05/09/2019 0.53* 0.00 - 0.07 K/uL Final   Performed at [facility], [place] [address] [place], [place] [address]    (this displays the last labs from the last 3 days)  No results found for: TOTALPROTELP, ALBUMINELP, A1GS, A2GS, BETS, BETA2SER, GAMS, MSPIKE, SPEI (this displays SPEP labs)  No results found for: KPAFRELGTCHN, LAMBDASER, KAPLAMBRATIO (kappa/lambda light chains)  No results found for: HGBA, HGBA2QUANT, HGBFQUANT, HGBSQUAN (Hemoglobinopathy evaluation)   No results found for: LDH  No results found for: IRON, TIBC, IRONPCTSAT (Iron and TIBC)  No results found for: FERRITIN  Urinalysis No results found for: COLORURINE, APPEARANCEUR, LABSPEC, PHURINE, GLUCOSEU, HGBUR, BILIRUBINUR, KETONESUR, PROTEINUR, UROBILINOGEN, NITRITE, LEUKOCYTESUR   STUDIES: No  results found.   ELIGIBLE FOR AVAILABLE RESEARCH PROTOCOL: no  ASSESSMENT: 43 y.o. [place] woman status post left breast biopsy x2 on 12/08/2018, for multicentric invasive lobular breast cancer, grade 2, estrogen and progesterone receptor positive, HER-2 nonamplified, with an MIB-1 of 3%  (a) bilateral breast MRI 12/20/2018 shows multiple masses throughout the left breast, with nipple retraction, but no evidence of chest wall invasion, no abnormal left axillary lymph nodes, and no findings of concern in  the right breast  (b) chest CT scan and bone scan 12/29/2018 show only an incidental goiter  (1) status post left skin sparing mastectomy at [facility] 01/16/2019 for an mpT3 pN1, stage IIA invasive lobular carcinoma, grade 2; repeat prognostic panel again estrogen and progesterone receptor strongly positive, HER-2 nonamplified.  (a) a total of 8 lymph nodes removed, one with macrometastasis (8 mm, no ECE)  (2) adjuvant chemotherapy will consist of doxorubicin and cyclophosphamide in dose dense fashion x4 starting 03/13/2019, to be followed by weekly paclitaxel x12  (a) echo 03/09/2019 finds normal left ventricular function at 55-60%  (b) cycle 2 doxorubicin and cyclophosphamide delayed 1 week secondary to nausea problems  (3) adjuvant radiation to follow  (4) antiestrogens to start at the completion of local treatment  (5) genetics testing 01/09/2019 through the [facility] STAT Breast Cancer Panel + Common Hereditary Cancers Panel found no deleterious mutations in ATM, BRCA1, BRCA2, CDH1, CHEK2, PALB2, PTEN, STK11 and TP53, APC, ATM, AXIN2, BARD1, BMPR1A, BRCA1, BRCA2, BRIP1, CDH1, CDKN2A (p14ARF), CDKN2A (p16INK4a), CKD4, CHEK2, CTNNA1, DICER1, EPCAM (Deletion/duplication testing only), GREM1 (promoter region deletion/duplication testing only), KIT, MEN1, MLH1, MSH2, MSH3, MSH6, MUTYH, NBN, NF1, NHTL1, PALB2, PDGFRA, PMS2, POLD1, POLE, PTEN, RAD50, RAD51C, RAD51D SDHB, SDHC, SDHD, SMAD4, SMARCA4. STK11, TP53, TSC1, TSC2, and VHL.  The following genes were evaluated for sequence changes only: SDHA and HOXB13 c.251G>A variant only.   PLAN: Erabella is doing well today.  She continues on adjuvant chemotherapy for her estrogen positive chemotherapy and is tolerating it well.  She will start weekly Paclitaxel today.  Her CBC is stable, CMET is pending.  I reviewed with Athira common adverse effects of the chemotherapy, in particular peripheral neuropathy and cryotherapy of her fingertips and toes to avoid  this adverse effect.  She understands this.  We also reviewed how to take her anti nausea medication, and healthy diet/exercise with her treatment.    We discussed her overall treatment plan, and when scans are indicated for breast cancer care.  She understands this part. She was recommended to continue with the appropriate pandemic precautions. She knows to call for any questions that may arise between now and her next appointment.  We are happy to see her sooner if needed.  A total of (30) minutes of face-to-face time was spent with this patient with greater than 50% of that time in counseling and care-coordination.   Wilber Bihari, NP  05/09/19 10:53 AM Medical Oncology and Hematology [facility] [place], [place] [address] Tel. [phone]    Fax. [phone]

## 2019-05-09 NOTE — Patient Instructions (Signed)

## 2019-05-09 NOTE — Patient Instructions (Signed)
[facility] Discharge Instructions for Patients Receiving Chemotherapy  Today you received the following chemotherapy agents :  Taxol.  To help prevent nausea and vomiting after your treatment, we encourage you to take your nausea medication as prescribed, and as instructed by Dr. Jana Hakim.   If you develop nausea and vomiting that is not controlled by your nausea medication, call the clinic.   BELOW ARE SYMPTOMS THAT SHOULD BE REPORTED IMMEDIATELY:  *FEVER GREATER THAN 100.5 F  *CHILLS WITH OR WITHOUT FEVER  NAUSEA AND VOMITING THAT IS NOT CONTROLLED WITH YOUR NAUSEA MEDICATION  *UNUSUAL SHORTNESS OF BREATH  *UNUSUAL BRUISING OR BLEEDING  TENDERNESS IN MOUTH AND THROAT WITH OR WITHOUT PRESENCE OF ULCERS  *URINARY PROBLEMS  *BOWEL PROBLEMS  UNUSUAL RASH Items with * indicate a potential emergency and should be followed up as soon as possible.  Feel free to call the clinic should you have any questions or concerns. The clinic phone number is [phone].  Please show the [place] at check-in to the Emergency Department and triage nurse.

## 2019-05-10 ENCOUNTER — Telehealth: Payer: Self-pay | Admitting: Adult Health

## 2019-05-10 ENCOUNTER — Telehealth: Payer: Self-pay

## 2019-05-10 LAB — URINE CULTURE: Culture: NO GROWTH

## 2019-05-10 NOTE — Telephone Encounter (Signed)
Patient called back for CHEMO FOLLOW up and she feels well, no issues regarding her new treatment yesterday and no questions at this time.

## 2019-05-10 NOTE — Telephone Encounter (Signed)
-----   Message from Gardenia Phlegm, NP sent at 05/10/2019  8:16 AM EDT ----- Please call patient and tell her that her urine looks good ----- Message ----- From: Interface, Lab In [place] Sent: 05/09/2019  12:58 PM EDT To: Gardenia Phlegm, NP

## 2019-05-10 NOTE — Telephone Encounter (Signed)
No los per 10/14. °

## 2019-05-10 NOTE — Telephone Encounter (Signed)
Left message on personal vm informing of urine results.  Left number to center for call back with any questions.

## 2019-05-16 ENCOUNTER — Encounter: Payer: Self-pay | Admitting: Adult Health

## 2019-05-16 ENCOUNTER — Inpatient Hospital Stay: Payer: 59

## 2019-05-16 ENCOUNTER — Inpatient Hospital Stay (HOSPITAL_BASED_OUTPATIENT_CLINIC_OR_DEPARTMENT_OTHER): Payer: 59 | Admitting: Adult Health

## 2019-05-16 ENCOUNTER — Other Ambulatory Visit: Payer: Self-pay

## 2019-05-16 ENCOUNTER — Other Ambulatory Visit: Payer: Self-pay | Admitting: Lab

## 2019-05-16 VITALS — BP 91/58 | HR 83 | Temp 97.8°F | Resp 18 | Ht 64.0 in | Wt 135.3 lb

## 2019-05-16 DIAGNOSIS — Z170 Estrogen receptor positive status [ER+]: Secondary | ICD-10-CM

## 2019-05-16 DIAGNOSIS — C50812 Malignant neoplasm of overlapping sites of left female breast: Secondary | ICD-10-CM | POA: Diagnosis not present

## 2019-05-16 DIAGNOSIS — Z95828 Presence of other vascular implants and grafts: Secondary | ICD-10-CM

## 2019-05-16 DIAGNOSIS — Z5111 Encounter for antineoplastic chemotherapy: Secondary | ICD-10-CM | POA: Diagnosis not present

## 2019-05-16 LAB — CBC WITH DIFFERENTIAL/PLATELET
Abs Immature Granulocytes: 0.08 10*3/uL — ABNORMAL HIGH (ref 0.00–0.07)
Basophils Absolute: 0.1 10*3/uL (ref 0.0–0.1)
Basophils Relative: 1 %
Eosinophils Absolute: 0 10*3/uL (ref 0.0–0.5)
Eosinophils Relative: 0 %
HCT: 30.6 % — ABNORMAL LOW (ref 36.0–46.0)
Hemoglobin: 10.2 g/dL — ABNORMAL LOW (ref 12.0–15.0)
Immature Granulocytes: 2 %
Lymphocytes Relative: 8 %
Lymphs Abs: 0.4 10*3/uL — ABNORMAL LOW (ref 0.7–4.0)
MCH: 31.1 pg (ref 26.0–34.0)
MCHC: 33.3 g/dL (ref 30.0–36.0)
MCV: 93.3 fL (ref 80.0–100.0)
Monocytes Absolute: 0.6 10*3/uL (ref 0.1–1.0)
Monocytes Relative: 12 %
Neutro Abs: 3.9 10*3/uL (ref 1.7–7.7)
Neutrophils Relative %: 77 %
Platelets: 314 10*3/uL (ref 150–400)
RBC: 3.28 MIL/uL — ABNORMAL LOW (ref 3.87–5.11)
RDW: 17.5 % — ABNORMAL HIGH (ref 11.5–15.5)
WBC: 5.1 10*3/uL (ref 4.0–10.5)
nRBC: 0 % (ref 0.0–0.2)

## 2019-05-16 LAB — COMPREHENSIVE METABOLIC PANEL
ALT: 168 U/L — ABNORMAL HIGH (ref 0–44)
AST: 57 U/L — ABNORMAL HIGH (ref 15–41)
Albumin: 3.8 g/dL (ref 3.5–5.0)
Alkaline Phosphatase: 63 U/L (ref 38–126)
Anion gap: 9 (ref 5–15)
BUN: 9 mg/dL (ref 6–20)
CO2: 25 mmol/L (ref 22–32)
Calcium: 8.9 mg/dL (ref 8.9–10.3)
Chloride: 107 mmol/L (ref 98–111)
Creatinine, Ser: 0.66 mg/dL (ref 0.44–1.00)
GFR calc Af Amer: >60 mL/min (ref >60)
GFR calc non Af Amer: >60 mL/min (ref >60)
Glucose, Bld: 131 mg/dL — ABNORMAL HIGH (ref 70–99)
Potassium: 3.8 mmol/L (ref 3.5–5.1)
Sodium: 141 mmol/L (ref 135–145)
Total Bilirubin: 0.3 mg/dL (ref 0.3–1.2)
Total Protein: 6.4 g/dL — ABNORMAL LOW (ref 6.5–8.1)

## 2019-05-16 MED ORDER — SODIUM CHLORIDE 0.9% FLUSH
10.0000 mL | Freq: Once | INTRAVENOUS | Status: AC
  Administered 2019-05-16: 10 mL via INTRAVENOUS
  Filled 2019-05-16: qty 10

## 2019-05-16 MED ORDER — HEPARIN SOD (PORK) LOCK FLUSH 100 UNIT/ML IV SOLN
500.0000 [IU] | Freq: Once | INTRAVENOUS | Status: DC | PRN
  Filled 2019-05-16: qty 5

## 2019-05-16 MED ORDER — HEPARIN SOD (PORK) LOCK FLUSH 100 UNIT/ML IV SOLN
500.0000 [IU] | Freq: Once | INTRAVENOUS | Status: AC
  Administered 2019-05-16: 500 [IU] via INTRAVENOUS
  Filled 2019-05-16: qty 5

## 2019-05-16 MED ORDER — SODIUM CHLORIDE 0.9% FLUSH
10.0000 mL | Freq: Once | INTRAVENOUS | Status: AC | PRN
  Administered 2019-05-16: 10 mL
  Filled 2019-05-16: qty 10

## 2019-05-16 NOTE — Addendum Note (Signed)
Addended by: Tami Lin on: 05/16/2019 01:02 PM   Modules accepted: Orders

## 2019-05-16 NOTE — Progress Notes (Signed)
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   She has completed four cycles of adjuvant Doxorubicin and Cyclophosphamide, and is due to started weekly Paclitaxel last week.  Today is week 2 of treatment.    REVIEW OF SYSTEMS: Tonya Whitehead is doing well today.  She has no current issues and says the Paclitaxel went well.  She has no fever, chills, chest pain, palpitations, cough, shortness of breath, nausea, vomiting, bowel/bladder changes or any other concerns.  A detailed ROS was otherwise non contributory.    HISTORY OF CURRENT ILLNESS: From the original intake note:  Tonya Whitehead notes a history of reconstructive left breast surgery in 2002 while living in [place], [place]. That was for symmetry. The left breast accordingly had some scar tissue and she thought that was what she was noting until mid March, when she felt the left breast was changing more and the nipple seemed to be sinking subtly.  She brought this to Dr Dahlia Bailiff attention and underwent bilateral diagnostic mammography with tomography and bilateral breast ultrasonography at [facility] on 12/01/2018 showing: breast density category C; findings concerning for extensive malignancy  throughout the left breast predominately involving the upper- and lower-outer quadrants.  The mass was palpable within the upper outer left breast and there was left nipple retraction as well as indentation along the inferior margin of the left breast.  Targeted ultrasound showed three masses measuring 5.6 cm (2 o'clock position), 4.0 cm (close to the nipple and contiguous to the prior mass), and 3.3 cm (6 o'clock position.).  Also noted was a borderline left axillary lymph node adjacent to the left axillary artery (and so not easy to biopsy).  In the right breast was noted a 1.3 cm cyst.  On 12/08/2018 Tonya Whitehead proceeded to biopsy of 2 of the left breast areas in question. The pathology from this procedure ([id number]) showed: invasive mammary carcinoma, grade 2, in two cores (at 2 o'clock and 6 o'clock) with identical histomorphology; e-cadherin is negative, consistent with a lobular phenotype. Prognostic indicators significant for: estrogen receptor, 70% positive and progesterone receptor, 70% positive, both with strong staining intensity. Proliferation marker Ki67 at 3%. HER2 negative by immunohistochemistry (1+).  Note also the patient had a thyroid ultrasound 07/18/2018 showing bilateral solitary nodules, which were biopsied 08/09/2018.  Both showed follicular nodules, Bethesda category 2 (benign).  The patient's subsequent history is as detailed below.   PAST MEDICAL HISTORY: Past Medical History:  Diagnosis Date  . Breast cancer ([place]) 12/08/2018   invasive lobular  . Family history of bladder cancer   . Family history of colon cancer   . Family history of lung cancer   . Family history of ovarian cancer     PAST SURGICAL HISTORY: Past Surgical History:  Procedure Laterality Date  . ADENOIDECTOMY     as a child  . BREAST EXCISIONAL BIOPSY  Left 2002   reconstructive surgery on nipple to match right breast  . IR IMAGING GUIDED PORT INSERTION  03/08/2019  Adenoidectomy as a child.    FAMILY HISTORY: Family History  Problem Relation Age of Onset  . Lung cancer Mother 55       smoker  . Ovarian cancer Mother 91  . Colon cancer Maternal Aunt        36s  . Bladder Cancer Paternal Grandfather        death was unrelated  . Cancer Paternal Aunt        through [facility], blood cancer   As of May 2020 patient's father is alive at age 75. Patient's mother is also living at age 60. She has ovarian cancer, diagnosed in 2019, and lung cancer (stage IV), diagnosed in 2016. She tested negative for BRCA.  A maternal aunt had colon cancer in her 79s.  3 other siblings of the patient's mother have not had any cancer.  On the paternal side the paternal grandmother had bladder cancer.  A paternal great aunt died from cancer of the pancreas.     GYNECOLOGIC HISTORY:  No LMP recorded. Menarche: 43 years old Age at first live birth: 43 years old GX P 3 LMP every month, 5 total days with 2 heavy Contraceptive no, husband with vasectomy. HRT n/a  Hysterectomy? no BSO? 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 Husband [name] is a physical therapist. He is biologically [place] and was adopted by a [place] family.  The patient lives at home with her husband and their 3 children: [name] is 24, [name] is 69, and  [name], 46.  The patient attends a [employer].    ADVANCED DIRECTIVES: Husband [name] is her HCPOA   HEALTH MAINTENANCE: Social History   Tobacco Use  . Smoking status: Never Smoker  . Smokeless tobacco: Never Used  Substance Use Topics  . Alcohol use: Not on file  . Drug use: Never     Colonoscopy: never done  PAP: not on file  Bone density: never done   Allergies  Allergen Reactions  . Gluten Meal Hives    Joint pain, upset stomach  . Sulfa Antibiotics     Unknown reaction " I was told I had a reaction when I was a baby "    Current Outpatient Medications  Medication Sig Dispense Refill  . ciprofloxacin  (CIPRO) 500 MG tablet Take one tablet twice daily for 5 days starting day 8 after each chemotherapy treatment 40 tablet 1  . dexamethasone (DECADRON) 4 MG tablet Take 2 tablets by mouth once a day on the day after chemotherapy and then take 2 tablets two times a day for 2 days. Take with food. 30 tablet 1  . levothyroxine (SYNTHROID) 25 MCG tablet Take 1 tablet (25 mcg total) by mouth daily before breakfast. 60 tablet 1  . lidocaine-prilocaine (EMLA) cream Apply to affected area once 30 g 3  . loratadine (CLARITIN) 10 MG tablet Take 10 mg by mouth daily.    Marland Kitchen LORazepam (ATIVAN) 0.5 MG tablet Place 1 tablet (0.5 mg total) under the tongue every 6 (six) hours as needed (Nausea). 40 tablet 0  . metoCLOPramide (REGLAN) 5 MG tablet Take 1 tablet (5 mg total) by mouth 4 (four) times daily -  before meals and at bedtime. 40 tablet 3  . ondansetron (ZOFRAN-ODT) 4 MG disintegrating tablet     . polyethylene glycol (  MIRALAX / GLYCOLAX) 17 g packet Take 17 g by mouth daily as needed for mild constipation.    . prochlorperazine (COMPAZINE) 10 MG tablet Take 1 tablet (10 mg total) by mouth every 6 (six) hours as needed (Nausea or vomiting). 30 tablet 1  . promethazine (PHENERGAN) 25 MG tablet Take 1 tablet (25 mg total) by mouth every 6 (six) hours as needed for nausea or vomiting. 30 tablet 1  . PROMETHEGAN 25 MG suppository INSERT 1 SUPPOSITORY RECTALLY EVERY 6 HOURS AS NEEDED FOR NAUSEA OR VOMITING *USE IF ACTIVELY VOMITING* 12 suppository 0   No current facility-administered medications for this visit.     OBJECTIVE:   Vitals:   05/16/19 1101  BP: (!) 91/58  Pulse: 83  Resp: 18  Temp: 97.8 F (36.6 C)  SpO2: 100%   Wt Readings from Last 3 Encounters:  05/16/19 135 lb 4.8 oz (61.4 kg)  05/09/19 137 lb 12.8 oz (62.5 kg)  04/24/19 138 lb (62.6 kg)   Body mass index is 23.22 kg/m.    ECOG FS:1 - Symptomatic but completely ambulatory GENERAL: Patient is a well appearing female in no acute  distress HEENT:  Sclerae anicteric.  Oropharynx clear and moist. No ulcerations or evidence of oropharyngeal candidiasis. Neck is supple.  NODES:  No cervical, supraclavicular, or axillary lymphadenopathy palpated.  BREAST EXAM:  Deferred. LUNGS:  Clear to auscultation bilaterally.  No wheezes or rhonchi. HEART:  Regular rate and rhythm. No murmur appreciated. ABDOMEN:  Soft, nontender.  Positive, normoactive bowel sounds. No organomegaly palpated. MSK:  No focal spinal tenderness to palpation. Full range of motion bilaterally in the upper extremities. EXTREMITIES:  No peripheral edema.   SKIN:  Clear with no obvious rashes or skin changes. No nail dyscrasia. NEURO:  Nonfocal. Well oriented.  Appropriate affect.      LAB RESULTS:  CMP     Component Value Date/Time   NA 140 05/09/2019 1013   K 4.1 05/09/2019 1013   CL 104 05/09/2019 1013   CO2 27 05/09/2019 1013   GLUCOSE 114 (H) 05/09/2019 1013   BUN 11 05/09/2019 1013   CREATININE 0.70 05/09/2019 1013   CREATININE 0.85 03/20/2019 1022   CALCIUM 9.1 05/09/2019 1013   PROT 6.7 05/09/2019 1013   ALBUMIN 3.8 05/09/2019 1013   AST 28 05/09/2019 1013   AST 13 (L) 03/20/2019 1022   ALT 73 (H) 05/09/2019 1013   ALT 40 03/20/2019 1022   ALKPHOS 98 05/09/2019 1013   BILITOT 0.2 (L) 05/09/2019 1013   BILITOT 0.7 03/20/2019 1022   GFRNONAA >60 05/09/2019 1013   GFRNONAA >60 03/20/2019 1022   GFRAA >60 05/09/2019 1013   GFRAA >60 03/20/2019 1022    No results found for: TOTALPROTELP, ALBUMINELP, A1GS, A2GS, BETS, BETA2SER, GAMS, MSPIKE, SPEI  No results found for: KPAFRELGTCHN, LAMBDASER, KAPLAMBRATIO  Lab Results  Component Value Date   WBC 5.1 05/16/2019   NEUTROABS 3.9 05/16/2019   HGB 10.2 (L) 05/16/2019   HCT 30.6 (L) 05/16/2019   MCV 93.3 05/16/2019   PLT 314 05/16/2019    _0 @  No results found for: LABCA2  No components found for: SLHTDS287  No results for input(s): INR in the last 168 hours.   No results found for: LABCA2  No results found for: GOT157  No results found for: WIO035  No results found for: DHR416  No results found for: CA2729  No components found for: HGQUANT  No results found for: CEA1 / No results found  for: CEA1   No results found for: AFPTUMOR  No results found for: [place]  No results found for: PSA1  Appointment on 05/16/2019  Component Date Value Ref Range Status  . WBC 05/16/2019 5.1  4.0 - 10.5 K/uL Final  . RBC 05/16/2019 3.28* 3.87 - 5.11 MIL/uL Final  . Hemoglobin 05/16/2019 10.2* 12.0 - 15.0 g/dL Final  . HCT 05/16/2019 30.6* 36.0 - 46.0 % Final  . MCV 05/16/2019 93.3  80.0 - 100.0 fL Final  . MCH 05/16/2019 31.1  26.0 - 34.0 pg Final  . MCHC 05/16/2019 33.3  30.0 - 36.0 g/dL Final  . RDW 05/16/2019 17.5* 11.5 - 15.5 % Final  . Platelets 05/16/2019 314  150 - 400 K/uL Final  . nRBC 05/16/2019 0.0  0.0 - 0.2 % Final  . Neutrophils Relative % 05/16/2019 77  % Final  . Neutro Abs 05/16/2019 3.9  1.7 - 7.7 K/uL Final  . Lymphocytes Relative 05/16/2019 8  % Final  . Lymphs Abs 05/16/2019 0.4* 0.7 - 4.0 K/uL Final  . Monocytes Relative 05/16/2019 12  % Final  . Monocytes Absolute 05/16/2019 0.6  0.1 - 1.0 K/uL Final  . Eosinophils Relative 05/16/2019 0  % Final  . Eosinophils Absolute 05/16/2019 0.0  0.0 - 0.5 K/uL Final  . Basophils Relative 05/16/2019 1  % Final  . Basophils Absolute 05/16/2019 0.1  0.0 - 0.1 K/uL Final  . Immature Granulocytes 05/16/2019 2  % Final  . Abs Immature Granulocytes 05/16/2019 0.08* 0.00 - 0.07 K/uL Final   Performed at [facility], [address] [place], [place] [address]    (this displays the last labs from the last 3 days)  No results found for: TOTALPROTELP, ALBUMINELP, A1GS, A2GS, BETS, BETA2SER, GAMS, MSPIKE, SPEI (this displays SPEP labs)  No results found for: KPAFRELGTCHN, LAMBDASER, KAPLAMBRATIO (kappa/lambda light chains)  No results found for: HGBA,  HGBA2QUANT, HGBFQUANT, HGBSQUAN (Hemoglobinopathy evaluation)   No results found for: LDH  No results found for: IRON, TIBC, IRONPCTSAT (Iron and TIBC)  No results found for: FERRITIN  Urinalysis    Component Value Date/Time   COLORURINE STRAW (A) 05/09/2019 [place] 05/09/2019 1234   LABSPEC 1.003 (L) 05/09/2019 1234   PHURINE 6.0 05/09/2019 [place] 05/09/2019 [place] 05/09/2019 [place] 05/09/2019 [place] 05/09/2019 [place] 05/09/2019 1234   NITRITE NEGATIVE 05/09/2019 [place] 05/09/2019 1234     STUDIES: No results found.   ELIGIBLE FOR AVAILABLE RESEARCH PROTOCOL: no  ASSESSMENT: 43 y.o. Tonya Whitehead woman status post left breast biopsy x2 on 12/08/2018, for multicentric invasive lobular breast cancer, grade 2, estrogen and progesterone receptor positive, HER-2 nonamplified, with an MIB-1 of 3%  (a) bilateral breast MRI 12/20/2018 shows multiple masses throughout the left breast, with nipple retraction, but no evidence of chest wall invasion, no abnormal left axillary lymph nodes, and no findings of concern in the right breast  (b) chest CT scan and bone scan 12/29/2018 show only an incidental goiter  (1) status post left skin sparing mastectomy at [facility] 01/16/2019 for an mpT3 pN1, stage IIA invasive lobular carcinoma, grade 2; repeat prognostic panel again estrogen and progesterone receptor strongly positive, HER-2 nonamplified.  (a) a total of 8 lymph nodes removed, one with macrometastasis (8 mm, no ECE)  (2) adjuvant chemotherapy will consist of doxorubicin and cyclophosphamide in dose dense fashion  x4 starting 03/13/2019, to be followed by weekly paclitaxel x12  (a) echo 03/09/2019 finds normal left ventricular function at 55-60%  (b) cycle 2 doxorubicin and cyclophosphamide delayed 1 week secondary to nausea problems  (3) adjuvant radiation to  follow  (4) antiestrogens to start at the completion of local treatment  (5) genetics testing 01/09/2019 through the [facility] STAT Breast Cancer Panel + Common Hereditary Cancers Panel found no deleterious mutations in ATM, BRCA1, BRCA2, CDH1, CHEK2, PALB2, PTEN, STK11 and TP53, APC, ATM, AXIN2, BARD1, BMPR1A, BRCA1, BRCA2, BRIP1, CDH1, CDKN2A (p14ARF), CDKN2A (p16INK4a), CKD4, CHEK2, CTNNA1, DICER1, EPCAM (Deletion/duplication testing only), GREM1 (promoter region deletion/duplication testing only), KIT, MEN1, MLH1, MSH2, MSH3, MSH6, MUTYH, NBN, NF1, NHTL1, PALB2, PDGFRA, PMS2, POLD1, POLE, PTEN, RAD50, RAD51C, RAD51D SDHB, SDHC, SDHD, SMAD4, SMARCA4. STK11, TP53, TSC1, TSC2, and VHL.  The following genes were evaluated for sequence changes only: SDHA and HOXB13 c.251G>A variant only.   PLAN: Tonya Whitehead is doing well today.  She tolerated her Paclitaxel well today and is here today to receive her next dose.  We are awaiting her lab results, but she appears well and ready to proceed so long as her lab values are within parameters.  We of course are following her closely for peripheral neuropathy which she has not developed and she is practicing cryotherapy.    I recommended continued activity as she is doing  We will see Tonya Whitehead back weekly for labs and treatment, and she will see myself or Dr. Jana Hakim with every other treatment until she gets to week 8, at that point, we will see her with every treatment.    Tonya Whitehead understands the above plan.  She was recommended to continue with the appropriate pandemic precautions. She knows to call for any questions that may arise between now and her next appointment.  We are happy to see her sooner if needed.  A total of (20) minutes of face-to-face time was spent with this patient with greater than 50% of that time in counseling and care-coordination.   Wilber Bihari, NP  05/16/19 11:38 AM Medical Oncology and Hematology [facility] [place], [place] [address] Tel. [phone]    Fax. [phone]

## 2019-05-16 NOTE — Progress Notes (Signed)
Per Dr. Jana Hakim hold treatment for D1C2 of Taxol due to increase in ALT.  Will recheck next week at visit.

## 2019-05-23 ENCOUNTER — Other Ambulatory Visit: Payer: Self-pay

## 2019-05-23 ENCOUNTER — Telehealth: Payer: Self-pay | Admitting: *Deleted

## 2019-05-23 ENCOUNTER — Inpatient Hospital Stay: Payer: 59

## 2019-05-23 ENCOUNTER — Other Ambulatory Visit: Payer: Self-pay | Admitting: Oncology

## 2019-05-23 VITALS — BP 121/78 | HR 76 | Temp 97.8°F | Resp 16

## 2019-05-23 DIAGNOSIS — C50812 Malignant neoplasm of overlapping sites of left female breast: Secondary | ICD-10-CM

## 2019-05-23 DIAGNOSIS — Z5111 Encounter for antineoplastic chemotherapy: Secondary | ICD-10-CM | POA: Diagnosis not present

## 2019-05-23 DIAGNOSIS — Z170 Estrogen receptor positive status [ER+]: Secondary | ICD-10-CM

## 2019-05-23 LAB — COMPREHENSIVE METABOLIC PANEL
ALT: 117 U/L — ABNORMAL HIGH (ref 0–44)
AST: 43 U/L — ABNORMAL HIGH (ref 15–41)
Albumin: 3.8 g/dL (ref 3.5–5.0)
Alkaline Phosphatase: 73 U/L (ref 38–126)
Anion gap: 9 (ref 5–15)
BUN: 10 mg/dL (ref 6–20)
CO2: 26 mmol/L (ref 22–32)
Calcium: 9.2 mg/dL (ref 8.9–10.3)
Chloride: 106 mmol/L (ref 98–111)
Creatinine, Ser: 0.66 mg/dL (ref 0.44–1.00)
GFR calc Af Amer: >60 mL/min (ref >60)
GFR calc non Af Amer: >60 mL/min (ref >60)
Glucose, Bld: 109 mg/dL — ABNORMAL HIGH (ref 70–99)
Potassium: 4.1 mmol/L (ref 3.5–5.1)
Sodium: 141 mmol/L (ref 135–145)
Total Bilirubin: 0.4 mg/dL (ref 0.3–1.2)
Total Protein: 6.6 g/dL (ref 6.5–8.1)

## 2019-05-23 LAB — CBC WITH DIFFERENTIAL/PLATELET
Abs Immature Granulocytes: 0.02 10*3/uL (ref 0.00–0.07)
Basophils Absolute: 0.1 10*3/uL (ref 0.0–0.1)
Basophils Relative: 1 %
Eosinophils Absolute: 0.1 10*3/uL (ref 0.0–0.5)
Eosinophils Relative: 2 %
HCT: 34.4 % — ABNORMAL LOW (ref 36.0–46.0)
Hemoglobin: 11.5 g/dL — ABNORMAL LOW (ref 12.0–15.0)
Immature Granulocytes: 0 %
Lymphocytes Relative: 10 %
Lymphs Abs: 0.6 10*3/uL — ABNORMAL LOW (ref 0.7–4.0)
MCH: 31.2 pg (ref 26.0–34.0)
MCHC: 33.4 g/dL (ref 30.0–36.0)
MCV: 93.2 fL (ref 80.0–100.0)
Monocytes Absolute: 0.8 10*3/uL (ref 0.1–1.0)
Monocytes Relative: 14 %
Neutro Abs: 4.2 10*3/uL (ref 1.7–7.7)
Neutrophils Relative %: 73 %
Platelets: 271 10*3/uL (ref 150–400)
RBC: 3.69 MIL/uL — ABNORMAL LOW (ref 3.87–5.11)
RDW: 16.4 % — ABNORMAL HIGH (ref 11.5–15.5)
WBC: 5.8 10*3/uL (ref 4.0–10.5)
nRBC: 0 % (ref 0.0–0.2)

## 2019-05-23 MED ORDER — SODIUM CHLORIDE 0.9% FLUSH
10.0000 mL | Freq: Once | INTRAVENOUS | Status: AC | PRN
  Administered 2019-05-23: 10 mL
  Filled 2019-05-23: qty 10

## 2019-05-23 MED ORDER — SODIUM CHLORIDE 0.9 % IV SOLN
80.0000 mg/m2 | Freq: Once | INTRAVENOUS | Status: DC
  Filled 2019-05-23: qty 23

## 2019-05-23 MED ORDER — FAMOTIDINE IN NACL 20-0.9 MG/50ML-% IV SOLN
20.0000 mg | Freq: Once | INTRAVENOUS | Status: AC
  Administered 2019-05-23: 20 mg via INTRAVENOUS

## 2019-05-23 MED ORDER — HEPARIN SOD (PORK) LOCK FLUSH 100 UNIT/ML IV SOLN
500.0000 [IU] | Freq: Once | INTRAVENOUS | Status: AC | PRN
  Administered 2019-05-23: 500 [IU]
  Filled 2019-05-23: qty 5

## 2019-05-23 MED ORDER — SODIUM CHLORIDE 0.9 % IV SOLN
12.0000 mg | Freq: Once | INTRAVENOUS | Status: AC
  Administered 2019-05-23: 12 mg via INTRAVENOUS
  Filled 2019-05-23: qty 1.2

## 2019-05-23 MED ORDER — SODIUM CHLORIDE 0.9 % IV SOLN: 72.0000 mg/m2 | Freq: Once | INTRAVENOUS | Status: DC

## 2019-05-23 MED ORDER — PALONOSETRON HCL INJECTION 0.25 MG/5ML
INTRAVENOUS | Status: AC
  Filled 2019-05-23: qty 5

## 2019-05-23 MED ORDER — DIPHENHYDRAMINE HCL 50 MG/ML IJ SOLN
INTRAMUSCULAR | Status: AC
  Filled 2019-05-23: qty 1

## 2019-05-23 MED ORDER — PALONOSETRON HCL INJECTION 0.25 MG/5ML
0.2500 mg | Freq: Once | INTRAVENOUS | Status: AC
  Administered 2019-05-23: 0.25 mg via INTRAVENOUS

## 2019-05-23 MED ORDER — SODIUM CHLORIDE 0.9 % IV SOLN
74.0000 mg/m2 | Freq: Once | INTRAVENOUS | Status: AC
  Administered 2019-05-23: 126 mg via INTRAVENOUS
  Filled 2019-05-23: qty 21

## 2019-05-23 MED ORDER — SODIUM CHLORIDE 0.9% FLUSH
10.0000 mL | INTRAVENOUS | Status: DC | PRN
  Administered 2019-05-23: 10 mL
  Filled 2019-05-23: qty 10

## 2019-05-23 MED ORDER — SODIUM CHLORIDE 0.9 % IV SOLN
Freq: Once | INTRAVENOUS | Status: AC
  Administered 2019-05-23: 11:00:00 via INTRAVENOUS
  Filled 2019-05-23: qty 250

## 2019-05-23 MED ORDER — DIPHENHYDRAMINE HCL 50 MG/ML IJ SOLN
25.0000 mg | Freq: Once | INTRAMUSCULAR | Status: AC
  Administered 2019-05-23: 25 mg via INTRAVENOUS

## 2019-05-23 MED ORDER — FAMOTIDINE IN NACL 20-0.9 MG/50ML-% IV SOLN
INTRAVENOUS | Status: AC
  Filled 2019-05-23: qty 50

## 2019-05-23 NOTE — Telephone Encounter (Signed)
Per MD review of elevated ALT and AST - order given to proceed with taxol today.

## 2019-05-23 NOTE — Telephone Encounter (Signed)
Per MD - pt is to have a 10% reduction in Taxol dose.

## 2019-05-23 NOTE — Progress Notes (Signed)
Per RN, decrease Taxol dose by 10% per MD.  Hardie Pulley, PharmD, BCPS, BCOP

## 2019-05-23 NOTE — Patient Instructions (Signed)
[facility] Discharge Instructions for Patients Receiving Chemotherapy  Today you received the following chemotherapy agents :  Taxol.  To help prevent nausea and vomiting after your treatment, we encourage you to take your nausea medication as prescribed, and as instructed by Dr. Jana Hakim.   If you develop nausea and vomiting that is not controlled by your nausea medication, call the clinic.   BELOW ARE SYMPTOMS THAT SHOULD BE REPORTED IMMEDIATELY:  *FEVER GREATER THAN 100.5 F  *CHILLS WITH OR WITHOUT FEVER  NAUSEA AND VOMITING THAT IS NOT CONTROLLED WITH YOUR NAUSEA MEDICATION  *UNUSUAL SHORTNESS OF BREATH  *UNUSUAL BRUISING OR BLEEDING  TENDERNESS IN MOUTH AND THROAT WITH OR WITHOUT PRESENCE OF ULCERS  *URINARY PROBLEMS  *BOWEL PROBLEMS  UNUSUAL RASH Items with * indicate a potential emergency and should be followed up as soon as possible.  Feel free to call the clinic should you have any questions or concerns. The clinic phone number is [phone].  Please show the [place] at check-in to the Emergency Department and triage nurse.

## 2019-05-24 ENCOUNTER — Telehealth: Payer: Self-pay | Admitting: *Deleted

## 2019-05-24 NOTE — Telephone Encounter (Signed)
This RN returned call to

## 2019-05-25 ENCOUNTER — Other Ambulatory Visit: Payer: Self-pay | Admitting: Medical

## 2019-05-28 NOTE — Telephone Encounter (Signed)
Refill request

## 2019-05-29 ENCOUNTER — Other Ambulatory Visit: Payer: Self-pay | Admitting: Adult Health

## 2019-05-29 MED ORDER — VENLAFAXINE HCL ER 37.5 MG PO CP24: 37.5000 mg | ORAL_CAPSULE | Freq: Every day | ORAL | 0 refills | Status: DC

## 2019-05-30 ENCOUNTER — Encounter: Payer: Self-pay | Admitting: Oncology

## 2019-05-30 ENCOUNTER — Inpatient Hospital Stay: Payer: 59

## 2019-05-30 ENCOUNTER — Encounter: Payer: Self-pay | Admitting: Adult Health

## 2019-05-30 ENCOUNTER — Inpatient Hospital Stay: Payer: 59 | Attending: Oncology

## 2019-05-30 ENCOUNTER — Inpatient Hospital Stay (HOSPITAL_BASED_OUTPATIENT_CLINIC_OR_DEPARTMENT_OTHER): Payer: 59 | Admitting: Adult Health

## 2019-05-30 ENCOUNTER — Other Ambulatory Visit: Payer: Self-pay

## 2019-05-30 VITALS — BP 103/73 | HR 70 | Temp 98.2°F | Resp 18 | Ht 64.0 in | Wt 137.9 lb

## 2019-05-30 DIAGNOSIS — Z23 Encounter for immunization: Secondary | ICD-10-CM | POA: Diagnosis not present

## 2019-05-30 DIAGNOSIS — C50412 Malignant neoplasm of upper-outer quadrant of left female breast: Secondary | ICD-10-CM | POA: Insufficient documentation

## 2019-05-30 DIAGNOSIS — Z9012 Acquired absence of left breast and nipple: Secondary | ICD-10-CM | POA: Insufficient documentation

## 2019-05-30 DIAGNOSIS — Z170 Estrogen receptor positive status [ER+]: Secondary | ICD-10-CM | POA: Diagnosis not present

## 2019-05-30 DIAGNOSIS — Z79899 Other long term (current) drug therapy: Secondary | ICD-10-CM | POA: Diagnosis not present

## 2019-05-30 DIAGNOSIS — C50812 Malignant neoplasm of overlapping sites of left female breast: Secondary | ICD-10-CM

## 2019-05-30 DIAGNOSIS — Z5111 Encounter for antineoplastic chemotherapy: Secondary | ICD-10-CM | POA: Insufficient documentation

## 2019-05-30 DIAGNOSIS — Z90722 Acquired absence of ovaries, bilateral: Secondary | ICD-10-CM | POA: Diagnosis not present

## 2019-05-30 LAB — COMPREHENSIVE METABOLIC PANEL
ALT: 131 U/L — ABNORMAL HIGH (ref 0–44)
AST: 47 U/L — ABNORMAL HIGH (ref 15–41)
Albumin: 4.1 g/dL (ref 3.5–5.0)
Alkaline Phosphatase: 71 U/L (ref 38–126)
Anion gap: 11 (ref 5–15)
BUN: 10 mg/dL (ref 6–20)
CO2: 23 mmol/L (ref 22–32)
Calcium: 9.3 mg/dL (ref 8.9–10.3)
Chloride: 105 mmol/L (ref 98–111)
Creatinine, Ser: 0.67 mg/dL (ref 0.44–1.00)
GFR calc Af Amer: >60 mL/min (ref >60)
GFR calc non Af Amer: >60 mL/min (ref >60)
Glucose, Bld: 108 mg/dL — ABNORMAL HIGH (ref 70–99)
Potassium: 4.1 mmol/L (ref 3.5–5.1)
Sodium: 139 mmol/L (ref 135–145)
Total Bilirubin: 0.5 mg/dL (ref 0.3–1.2)
Total Protein: 6.8 g/dL (ref 6.5–8.1)

## 2019-05-30 LAB — CBC WITH DIFFERENTIAL/PLATELET
Abs Immature Granulocytes: 0.05 10*3/uL (ref 0.00–0.07)
Basophils Absolute: 0.1 10*3/uL (ref 0.0–0.1)
Basophils Relative: 1 %
Eosinophils Absolute: 0.4 10*3/uL (ref 0.0–0.5)
Eosinophils Relative: 7 %
HCT: 33.9 % — ABNORMAL LOW (ref 36.0–46.0)
Hemoglobin: 11.1 g/dL — ABNORMAL LOW (ref 12.0–15.0)
Immature Granulocytes: 1 %
Lymphocytes Relative: 10 %
Lymphs Abs: 0.5 10*3/uL — ABNORMAL LOW (ref 0.7–4.0)
MCH: 31 pg (ref 26.0–34.0)
MCHC: 32.7 g/dL (ref 30.0–36.0)
MCV: 94.7 fL (ref 80.0–100.0)
Monocytes Absolute: 0.4 10*3/uL (ref 0.1–1.0)
Monocytes Relative: 9 %
Neutro Abs: 3.5 10*3/uL (ref 1.7–7.7)
Neutrophils Relative %: 72 %
Platelets: 226 10*3/uL (ref 150–400)
RBC: 3.58 MIL/uL — ABNORMAL LOW (ref 3.87–5.11)
RDW: 15.4 % (ref 11.5–15.5)
WBC: 4.9 10*3/uL (ref 4.0–10.5)
nRBC: 0 % (ref 0.0–0.2)

## 2019-05-30 MED ORDER — FAMOTIDINE IN NACL 20-0.9 MG/50ML-% IV SOLN
INTRAVENOUS | Status: AC
  Filled 2019-05-30: qty 50

## 2019-05-30 MED ORDER — FAMOTIDINE IN NACL 20-0.9 MG/50ML-% IV SOLN
20.0000 mg | Freq: Once | INTRAVENOUS | Status: AC
  Administered 2019-05-30: 20 mg via INTRAVENOUS

## 2019-05-30 MED ORDER — DEXAMETHASONE SODIUM PHOSPHATE 10 MG/ML IJ SOLN
INTRAMUSCULAR | Status: AC
  Filled 2019-05-30: qty 1

## 2019-05-30 MED ORDER — DIPHENHYDRAMINE HCL 50 MG/ML IJ SOLN
25.0000 mg | Freq: Once | INTRAMUSCULAR | Status: AC
  Administered 2019-05-30: 25 mg via INTRAVENOUS

## 2019-05-30 MED ORDER — SODIUM CHLORIDE 0.9 % IV SOLN
60.0000 mg/m2 | Freq: Once | INTRAVENOUS | Status: AC
  Administered 2019-05-30: 102 mg via INTRAVENOUS
  Filled 2019-05-30: qty 17

## 2019-05-30 MED ORDER — SODIUM CHLORIDE 0.9% FLUSH
10.0000 mL | INTRAVENOUS | Status: DC | PRN
  Administered 2019-05-30: 10 mL
  Filled 2019-05-30: qty 10

## 2019-05-30 MED ORDER — DEXAMETHASONE SODIUM PHOSPHATE 10 MG/ML IJ SOLN
4.0000 mg | Freq: Once | INTRAMUSCULAR | Status: AC
  Administered 2019-05-30: 4 mg via INTRAVENOUS

## 2019-05-30 MED ORDER — SODIUM CHLORIDE 0.9 % IV SOLN
Freq: Once | INTRAVENOUS | Status: AC
  Administered 2019-05-30: 11:00:00 via INTRAVENOUS
  Filled 2019-05-30: qty 250

## 2019-05-30 MED ORDER — HEPARIN SOD (PORK) LOCK FLUSH 100 UNIT/ML IV SOLN
500.0000 [IU] | Freq: Once | INTRAVENOUS | Status: AC | PRN
  Administered 2019-05-30: 500 [IU]
  Filled 2019-05-30: qty 5

## 2019-05-30 MED ORDER — SODIUM CHLORIDE 0.9% FLUSH
10.0000 mL | Freq: Once | INTRAVENOUS | Status: AC | PRN
  Administered 2019-05-30: 10:00:00 10 mL
  Filled 2019-05-30: qty 10

## 2019-05-30 MED ORDER — PALONOSETRON HCL INJECTION 0.25 MG/5ML
0.2500 mg | Freq: Once | INTRAVENOUS | Status: AC
  Administered 2019-05-30: 0.25 mg via INTRAVENOUS

## 2019-05-30 MED ORDER — PALONOSETRON HCL INJECTION 0.25 MG/5ML
INTRAVENOUS | Status: AC
  Filled 2019-05-30: qty 5

## 2019-05-30 MED ORDER — DIPHENHYDRAMINE HCL 50 MG/ML IJ SOLN
INTRAMUSCULAR | Status: AC
  Filled 2019-05-30: qty 1

## 2019-05-30 MED ORDER — SODIUM CHLORIDE 0.9 % IV SOLN: 4.0000 mg | Freq: Once | INTRAVENOUS | Status: DC

## 2019-05-30 NOTE — Patient Instructions (Signed)
[facility] Discharge Instructions for Patients Receiving Chemotherapy  Today you received the following chemotherapy agents:  Taxol.  To help prevent nausea and vomiting after your treatment, we encourage you to take your nausea medication as directed.   If you develop nausea and vomiting that is not controlled by your nausea medication, call the clinic.   BELOW ARE SYMPTOMS THAT SHOULD BE REPORTED IMMEDIATELY:  *FEVER GREATER THAN 100.5 F  *CHILLS WITH OR WITHOUT FEVER  NAUSEA AND VOMITING THAT IS NOT CONTROLLED WITH YOUR NAUSEA MEDICATION  *UNUSUAL SHORTNESS OF BREATH  *UNUSUAL BRUISING OR BLEEDING  TENDERNESS IN MOUTH AND THROAT WITH OR WITHOUT PRESENCE OF ULCERS  *URINARY PROBLEMS  *BOWEL PROBLEMS  UNUSUAL RASH Items with * indicate a potential emergency and should be followed up as soon as possible.  Feel free to call the clinic should you have any questions or concerns. The clinic phone number is [phone].  Please show the CHEMO ALERT CARD at check-in to the Emergency Department and triage nurse.   

## 2019-05-30 NOTE — Patient Instructions (Signed)

## 2019-05-30 NOTE — Progress Notes (Signed)
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   She has completed four cycles of adjuvant Doxorubicin and Cyclophosphamide, and is now receive weekly Paclitaxel.  Today is week 3 of treatment.  She started treatment with the weekly Paclitaxel on 05/09/2019 and did well with this.  However a week later her transaminases were elevated and treatment was held.  She returned on 10/28 and Dr. Jana Hakim reduced the Paclitaxel by 10%.  She   REVIEW OF SYSTEMS: Tonya Whitehead was talking to our breast navigator Dawn about her hot flashes.  Dawn had discussed effexor which I sent in.  Tonya Whitehead has not yet started this.  She is taking benadryl at night which is helping her sleep.  She also notes red sunburn sensation on her feet the first three days after her treatment and then subsequent peeling.  HISTORY OF CURRENT ILLNESS: From the original intake note:  Tonya Whitehead notes a history of reconstructive left breast surgery in 2002 while living in [place], [place]. That was for symmetry. The left breast accordingly had some scar tissue and she thought that was what she was noting until mid March, when she felt the left breast was changing more and the nipple seemed to be  sinking subtly.  She brought this to Dr Dahlia Bailiff attention and underwent bilateral diagnostic mammography with tomography and bilateral breast ultrasonography at [facility] on 12/01/2018 showing: breast density category C; findings concerning for extensive malignancy throughout the left breast predominately involving the upper- and lower-outer quadrants.  The mass was palpable within the upper outer left breast and there was left nipple retraction as well as indentation along the inferior margin of the left breast.  Targeted ultrasound showed three masses measuring 5.6 cm (2 o'clock position), 4.0 cm (close to the nipple and contiguous to the prior mass), and 3.3 cm (6 o'clock position.).  Also noted was a borderline left axillary lymph node adjacent to the left axillary artery (and so not easy to biopsy).  In the right breast was noted a 1.3 cm cyst.  On 12/08/2018 Tonya Whitehead proceeded to biopsy of 2 of the left breast areas in question. The pathology from this procedure ([id number]) showed: invasive mammary carcinoma, grade 2, in two cores (at 2 o'clock and 6 o'clock) with identical histomorphology; e-cadherin is negative, consistent with a lobular phenotype. Prognostic indicators significant for: estrogen receptor, 70% positive and progesterone receptor, 70% positive, both with strong staining intensity. Proliferation marker Ki67 at 3%. HER2 negative by immunohistochemistry (1+).  Note also the patient had a thyroid ultrasound 07/18/2018 showing bilateral solitary nodules, which were biopsied 08/09/2018.  Both showed follicular nodules, Bethesda category 2 (benign).  The patient's subsequent history is as detailed below.   PAST MEDICAL HISTORY: Past Medical History:  Diagnosis Date   Breast cancer ([place]) 12/08/2018   invasive lobular   Family history of bladder cancer  Family history of colon cancer    Family history of lung cancer    Family history of ovarian cancer     PAST  SURGICAL HISTORY: Past Surgical History:  Procedure Laterality Date   ADENOIDECTOMY     as a child   BREAST EXCISIONAL BIOPSY Left 2002   reconstructive surgery on nipple to match right breast   IR IMAGING GUIDED PORT INSERTION  03/08/2019  Adenoidectomy as a child.   FAMILY HISTORY: Family History  Problem Relation Age of Onset   Lung cancer Mother 72       smoker   Ovarian cancer Mother 79   Colon cancer Maternal Aunt        44s   Bladder Cancer Paternal Grandfather        death was unrelated   Cancer Paternal Aunt        through [facility], blood cancer   As of May 2020 patient's father is alive at age 50. Patient's mother is also living at age 74. She has ovarian cancer, diagnosed in 2019, and lung cancer (stage IV), diagnosed in 2016. She tested negative for BRCA.  A maternal aunt had colon cancer in her 64s.  3 other siblings of the patient's mother have not had any cancer.  On the paternal side the paternal grandmother had bladder cancer.  A paternal great aunt died from cancer of the pancreas.     GYNECOLOGIC HISTORY:  No LMP recorded. Menarche: 43 years old Age at first live birth: 43 years old GX P 3 LMP every month, 5 total days with 2 heavy Contraceptive no, husband with vasectomy. HRT n/a  Hysterectomy? no BSO? 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 Husband [name] is a physical therapist. He is biologically [place] and was adopted by a [place] family.  The patient lives at home with her husband and their 3 children: [name] is 86, [name] is 51, and  [name], 28.  The patient attends a [employer].    ADVANCED DIRECTIVES: Husband [name] is her HCPOA   HEALTH MAINTENANCE: Social History   Tobacco Use   Smoking status: Never Smoker   Smokeless tobacco: Never Used  Substance Use Topics   Alcohol use: Not on file   Drug use: Never     Colonoscopy: never done  PAP: not on file  Bone density:  never done   Allergies  Allergen Reactions   Gluten Meal Hives    Joint pain, upset stomach   Sulfa Antibiotics     Unknown reaction " I was told I had a reaction when I was a baby "    Current Outpatient Medications  Medication Sig Dispense Refill   ciprofloxacin (CIPRO) 500 MG tablet Take one tablet twice daily for 5 days starting day 8 after each chemotherapy treatment 40 tablet 1   dexamethasone (DECADRON) 4 MG tablet Take 2 tablets by mouth once a day on the day after chemotherapy and then take 2 tablets two times a day for 2 days. Take with food. 30 tablet 1   levothyroxine (SYNTHROID) 25 MCG tablet Take 1 tablet (25 mcg total) by mouth daily before breakfast. 60 tablet 1   lidocaine-prilocaine (EMLA) cream Apply to affected area once 30 g 3   loratadine (CLARITIN) 10 MG tablet Take 10 mg by mouth daily.     LORazepam (ATIVAN) 0.5 MG tablet Place 1 tablet (0.5 mg total) under the tongue every 6 (six) hours  as needed (Nausea). 40 tablet 0   metoCLOPramide (REGLAN) 5 MG tablet Take 1 tablet (5 mg total) by mouth 4 (four) times daily -  before meals and at bedtime. 40 tablet 3   ondansetron (ZOFRAN-ODT) 4 MG disintegrating tablet      polyethylene glycol (MIRALAX / GLYCOLAX) 17 g packet Take 17 g by mouth daily as needed for mild constipation.     prochlorperazine (COMPAZINE) 10 MG tablet Take 1 tablet (10 mg total) by mouth every 6 (six) hours as needed (Nausea or vomiting). 30 tablet 1   promethazine (PHENERGAN) 25 MG tablet Take 1 tablet (25 mg total) by mouth every 6 (six) hours as needed for nausea or vomiting. 30 tablet 1   PROMETHEGAN 25 MG suppository INSERT 1 SUPPOSITORY RECTALLY EVERY 6 HOURS AS NEEDED FOR NAUSEA OR VOMITING *USE IF ACTIVELY VOMITING* 12 suppository 0   venlafaxine XR (EFFEXOR-XR) 37.5 MG 24 hr capsule Take 1 capsule (37.5 mg total) by mouth daily with breakfast. 30 capsule 0   No current facility-administered medications for this visit.      OBJECTIVE:   Vitals:   05/30/19 1016  BP: 103/73  Pulse: 70  Resp: 18  Temp: 98.2 F (36.8 C)  SpO2: 100%   Wt Readings from Last 3 Encounters:  05/30/19 137 lb 14.4 oz (62.6 kg)  05/16/19 135 lb 4.8 oz (61.4 kg)  05/09/19 137 lb 12.8 oz (62.5 kg)   Body mass index is 23.67 kg/m.    ECOG FS:1 - Symptomatic but completely ambulatory GENERAL: Patient is a well appearing female in no acute distress HEENT:  Sclerae anicteric.  Oropharynx clear and moist. No ulcerations or evidence of oropharyngeal candidiasis. Neck is supple.  NODES:  No cervical, supraclavicular, or axillary lymphadenopathy palpated.  BREAST EXAM:  Deferred. LUNGS:  Clear to auscultation bilaterally.  No wheezes or rhonchi. HEART:  Regular rate and rhythm. No murmur appreciated. ABDOMEN:  Soft, nontender.  Positive, normoactive bowel sounds. No organomegaly palpated. MSK:  No focal spinal tenderness to palpation. Full range of motion bilaterally in the upper extremities. EXTREMITIES:  No peripheral edema.   SKIN:  Clear with no obvious rashes or skin changes. No nail dyscrasia. NEURO:  Nonfocal. Well oriented.  Appropriate affect.      LAB RESULTS:  CMP     Component Value Date/Time   NA 139 05/30/2019 1005   K 4.1 05/30/2019 1005   CL 105 05/30/2019 1005   CO2 23 05/30/2019 1005   GLUCOSE 108 (H) 05/30/2019 1005   BUN 10 05/30/2019 1005   CREATININE 0.67 05/30/2019 1005   CREATININE 0.85 03/20/2019 1022   CALCIUM 9.3 05/30/2019 1005   PROT 6.8 05/30/2019 1005   ALBUMIN 4.1 05/30/2019 1005   AST 47 (H) 05/30/2019 1005   AST 13 (L) 03/20/2019 1022   ALT 131 (H) 05/30/2019 1005   ALT 40 03/20/2019 1022   ALKPHOS 71 05/30/2019 1005   BILITOT 0.5 05/30/2019 1005   BILITOT 0.7 03/20/2019 1022   GFRNONAA >60 05/30/2019 1005   GFRNONAA >60 03/20/2019 1022   GFRAA >60 05/30/2019 1005   GFRAA >60 03/20/2019 1022    No results found for: TOTALPROTELP, ALBUMINELP, A1GS, A2GS, BETS, BETA2SER,  GAMS, MSPIKE, SPEI  No results found for: KPAFRELGTCHN, LAMBDASER, KAPLAMBRATIO  Lab Results  Component Value Date   WBC 4.9 05/30/2019   NEUTROABS 3.5 05/30/2019   HGB 11.1 (L) 05/30/2019   HCT 33.9 (L) 05/30/2019   MCV 94.7 05/30/2019   PLT  226 05/30/2019    '@LASTCHEMISTRY' @  No results found for: LABCA2  No components found for: ZHGDJM426  No results for input(s): INR in the last 168 hours.  No results found for: LABCA2  No results found for: STM196  No results found for: QIW979  No results found for: GXQ119  No results found for: CA2729  No components found for: HGQUANT  No results found for: CEA1 / No results found for: CEA1   No results found for: AFPTUMOR  No results found for: CHROMOGRNA  No results found for: PSA1  Appointment on 05/30/2019  Component Date Value Ref Range Status   Sodium 05/30/2019 139  135 - 145 mmol/L Final   Potassium 05/30/2019 4.1  3.5 - 5.1 mmol/L Final   Chloride 05/30/2019 105  98 - 111 mmol/L Final   CO2 05/30/2019 23  22 - 32 mmol/L Final   Glucose, Bld 05/30/2019 108* 70 - 99 mg/dL Final   BUN 05/30/2019 10  6 - 20 mg/dL Final   Creatinine, Ser 05/30/2019 0.67  0.44 - 1.00 mg/dL Final   Calcium 05/30/2019 9.3  8.9 - 10.3 mg/dL Final   Total Protein 05/30/2019 6.8  6.5 - 8.1 g/dL Final   Albumin 05/30/2019 4.1  3.5 - 5.0 g/dL Final   AST 05/30/2019 47* 15 - 41 U/L Final   ALT 05/30/2019 131* 0 - 44 U/L Final   Alkaline Phosphatase 05/30/2019 71  38 - 126 U/L Final   Total Bilirubin 05/30/2019 0.5  0.3 - 1.2 mg/dL Final   GFR calc non Af Amer 05/30/2019 >60  >60 mL/min Final   GFR calc Af Amer 05/30/2019 >60  >60 mL/min Final   Anion gap 05/30/2019 11  5 - 15 Final   Performed at [facility], [place] [address], [place], [place] [address]   WBC 05/30/2019 4.9  4.0 - 10.5 K/uL Final   RBC 05/30/2019 3.58* 3.87 - 5.11 MIL/uL Final   Hemoglobin 05/30/2019 11.1* 12.0 - 15.0 g/dL  Final   HCT 05/30/2019 33.9* 36.0 - 46.0 % Final   MCV 05/30/2019 94.7  80.0 - 100.0 fL Final   MCH 05/30/2019 31.0  26.0 - 34.0 pg Final   MCHC 05/30/2019 32.7  30.0 - 36.0 g/dL Final   RDW 05/30/2019 15.4  11.5 - 15.5 % Final   Platelets 05/30/2019 226  150 - 400 K/uL Final   nRBC 05/30/2019 0.0  0.0 - 0.2 % Final   Neutrophils Relative % 05/30/2019 72  % Final   Neutro Abs 05/30/2019 3.5  1.7 - 7.7 K/uL Final   Lymphocytes Relative 05/30/2019 10  % Final   Lymphs Abs 05/30/2019 0.5* 0.7 - 4.0 K/uL Final   Monocytes Relative 05/30/2019 9  % Final   Monocytes Absolute 05/30/2019 0.4  0.1 - 1.0 K/uL Final   Eosinophils Relative 05/30/2019 7  % Final   Eosinophils Absolute 05/30/2019 0.4  0.0 - 0.5 K/uL Final   Basophils Relative 05/30/2019 1  % Final   Basophils Absolute 05/30/2019 0.1  0.0 - 0.1 K/uL Final   Immature Granulocytes 05/30/2019 1  % Final   Abs Immature Granulocytes 05/30/2019 0.05  0.00 - 0.07 K/uL Final   Performed at [facility], [place] [address], [place], [place] [address]    (this displays the last labs from the last 3 days)  No results found for: TOTALPROTELP, ALBUMINELP, A1GS, A2GS, BETS, BETA2SER, GAMS, MSPIKE, SPEI (this displays SPEP labs)  No results  found for: KPAFRELGTCHN, LAMBDASER, KAPLAMBRATIO (kappa/lambda light chains)  No results found for: HGBA, HGBA2QUANT, HGBFQUANT, HGBSQUAN (Hemoglobinopathy evaluation)   No results found for: LDH  No results found for: IRON, TIBC, IRONPCTSAT (Iron and TIBC)  No results found for: FERRITIN  Urinalysis    Component Value Date/Time   COLORURINE STRAW (A) 05/09/2019 [place] 05/09/2019 1234   LABSPEC 1.003 (L) 05/09/2019 1234   PHURINE 6.0 05/09/2019 [place] 05/09/2019 [place] 05/09/2019 [place] 05/09/2019 [place] 05/09/2019 1234   PROTEINUR NEGATIVE 05/09/2019 1234    NITRITE NEGATIVE 05/09/2019 1234   LEUKOCYTESUR NEGATIVE 05/09/2019 1234     STUDIES: No results found.   ELIGIBLE FOR AVAILABLE RESEARCH PROTOCOL: no  ASSESSMENT: 43 y.o.  woman status post left breast biopsy x2 on 12/08/2018, for multicentric invasive lobular breast cancer, grade 2, estrogen and progesterone receptor positive, HER-2 nonamplified, with an MIB-1 of 3%  (a) bilateral breast MRI 12/20/2018 shows multiple masses throughout the left breast, with nipple retraction, but no evidence of chest wall invasion, no abnormal left axillary lymph nodes, and no findings of concern in the right breast  (b) chest CT scan and bone scan 12/29/2018 show only an incidental goiter  (1) status post left skin sparing maste[facility] 01/16/2019 for an mpT3 pN1, stage IIA invasive lobular carcinoma, grade 2; repeat prognostic panel again estrogen and progesterone receptor strongly positive, HER-2 nonamplified.  (a) a total of 8 lymph nodes removed, one with macrometastasis (8 mm, no ECE)  (2) adjuvant chemotherapy will consist of doxorubicin and cyclophosphamide in dose dense fashion x4 starting 03/13/2019, to be followed by weekly paclitaxel x12  (a) echo 03/09/2019 finds normal left ventricular function at 55-60%  (b) cycle 2 doxorubicin and cyclophosphamide delayed 1 week secondary to nausea problems  (3) adjuvant radiation to follow  (4) antiestrogens to start at the completion of local treatment  (5) genetics testing 01/09/2019 through the [facility] STAT Breast Cancer Panel + Common Hereditary Cancers Panel found no deleterious mutations in ATM, BRCA1, BRCA2, CDH1, CHEK2, PALB2, PTEN, STK11 and TP53, APC, ATM, AXIN2, BARD1, BMPR1A, BRCA1, BRCA2, BRIP1, CDH1, CDKN2A (p14ARF), CDKN2A (p16INK4a), CKD4, CHEK2, CTNNA1, DICER1, EPCAM (Deletion/duplication testing only), GREM1 (promoter region deletion/duplication testing only), KIT, MEN1, MLH1, MSH2, MSH3, MSH6, MUTYH, NBN, NF1, NHTL1,  PALB2, PDGFRA, PMS2, POLD1, POLE, PTEN, RAD50, RAD51C, RAD51D SDHB, SDHC, SDHD, SMAD4, SMARCA4. STK11, TP53, TSC1, TSC2, and VHL.  The following genes were evaluated for sequence changes only: SDHA and HOXB13 c.251G>A variant only.   PLAN: Lawrence is doing well today.  We are following closely with the Paclitaxel, because her liver enzymes have increased with it.  I reviewed her ALT and AST with Dr. Jana Hakim today which are 45 and 131 today.  We will further dose reduce the Paclitaxel to 86m/m2.  We will see her next week and see how she tolerates this.  She will continue the Benadryl for now to help with sleep and night time hot flashes.  She has the effexor, if she feels like the benadryl is inadequate that she can start at that point.  I recommended she continue to moisturize her feet.  She will take a picture if she has any further eye swelling after her chemotherapy so we can look at it.  She can always call [facility] if anything persists after taking benadryl.    JKyellewill return next week for labs, f/u,  and her next Paclitaxel.    Yulitza understands the above plan.  She was recommended to continue with the appropriate pandemic precautions. She knows to call for any questions that may arise between now and her next appointment.  We are happy to see her sooner if needed.  A total of (20) minutes of face-to-face time was spent with this patient with greater than 50% of that time in counseling and care-coordination.   Wilber Bihari, NP  05/30/19 11:04 AM Medical Oncology and Hematology [facility] [place], [place] [address] Tel. [phone]    Fax. [phone]

## 2019-06-04 ENCOUNTER — Encounter: Payer: Self-pay | Admitting: *Deleted

## 2019-06-06 ENCOUNTER — Other Ambulatory Visit: Payer: 59

## 2019-06-06 ENCOUNTER — Inpatient Hospital Stay: Payer: 59

## 2019-06-06 ENCOUNTER — Encounter: Payer: Self-pay | Admitting: Oncology

## 2019-06-06 ENCOUNTER — Encounter: Payer: Self-pay | Admitting: Adult Health

## 2019-06-06 ENCOUNTER — Ambulatory Visit: Payer: 59

## 2019-06-06 ENCOUNTER — Inpatient Hospital Stay (HOSPITAL_BASED_OUTPATIENT_CLINIC_OR_DEPARTMENT_OTHER): Payer: 59 | Admitting: Adult Health

## 2019-06-06 ENCOUNTER — Other Ambulatory Visit: Payer: Self-pay

## 2019-06-06 VITALS — BP 109/63 | HR 77 | Temp 98.5°F | Resp 16 | Ht 64.0 in | Wt 140.5 lb

## 2019-06-06 DIAGNOSIS — Z5111 Encounter for antineoplastic chemotherapy: Secondary | ICD-10-CM | POA: Diagnosis not present

## 2019-06-06 DIAGNOSIS — C50812 Malignant neoplasm of overlapping sites of left female breast: Secondary | ICD-10-CM | POA: Diagnosis not present

## 2019-06-06 DIAGNOSIS — Z170 Estrogen receptor positive status [ER+]: Secondary | ICD-10-CM

## 2019-06-06 LAB — COMPREHENSIVE METABOLIC PANEL
ALT: 103 U/L — ABNORMAL HIGH (ref 0–44)
AST: 43 U/L — ABNORMAL HIGH (ref 15–41)
Albumin: 4.1 g/dL (ref 3.5–5.0)
Alkaline Phosphatase: 77 U/L (ref 38–126)
Anion gap: 10 (ref 5–15)
BUN: 9 mg/dL (ref 6–20)
CO2: 26 mmol/L (ref 22–32)
Calcium: 9.1 mg/dL (ref 8.9–10.3)
Chloride: 104 mmol/L (ref 98–111)
Creatinine, Ser: 0.66 mg/dL (ref 0.44–1.00)
GFR calc Af Amer: >60 mL/min (ref >60)
GFR calc non Af Amer: >60 mL/min (ref >60)
Glucose, Bld: 95 mg/dL (ref 70–99)
Potassium: 4 mmol/L (ref 3.5–5.1)
Sodium: 140 mmol/L (ref 135–145)
Total Bilirubin: 0.5 mg/dL (ref 0.3–1.2)
Total Protein: 6.9 g/dL (ref 6.5–8.1)

## 2019-06-06 LAB — CBC WITH DIFFERENTIAL/PLATELET
Abs Immature Granulocytes: 0.06 10*3/uL (ref 0.00–0.07)
Basophils Absolute: 0 10*3/uL (ref 0.0–0.1)
Basophils Relative: 1 %
Eosinophils Absolute: 0.3 10*3/uL (ref 0.0–0.5)
Eosinophils Relative: 6 %
HCT: 33.3 % — ABNORMAL LOW (ref 36.0–46.0)
Hemoglobin: 11.2 g/dL — ABNORMAL LOW (ref 12.0–15.0)
Immature Granulocytes: 1 %
Lymphocytes Relative: 11 %
Lymphs Abs: 0.6 10*3/uL — ABNORMAL LOW (ref 0.7–4.0)
MCH: 31.6 pg (ref 26.0–34.0)
MCHC: 33.6 g/dL (ref 30.0–36.0)
MCV: 94.1 fL (ref 80.0–100.0)
Monocytes Absolute: 0.4 10*3/uL (ref 0.1–1.0)
Monocytes Relative: 7 %
Neutro Abs: 3.8 10*3/uL (ref 1.7–7.7)
Neutrophils Relative %: 74 %
Platelets: 230 10*3/uL (ref 150–400)
RBC: 3.54 MIL/uL — ABNORMAL LOW (ref 3.87–5.11)
RDW: 15.6 % — ABNORMAL HIGH (ref 11.5–15.5)
WBC: 5.1 10*3/uL (ref 4.0–10.5)
nRBC: 0 % (ref 0.0–0.2)

## 2019-06-06 MED ORDER — DIPHENHYDRAMINE HCL 50 MG/ML IJ SOLN
INTRAMUSCULAR | Status: AC
  Filled 2019-06-06: qty 1

## 2019-06-06 MED ORDER — SODIUM CHLORIDE 0.9% FLUSH
10.0000 mL | Freq: Once | INTRAVENOUS | Status: AC | PRN
  Administered 2019-06-06: 10 mL
  Filled 2019-06-06: qty 10

## 2019-06-06 MED ORDER — SODIUM CHLORIDE 0.9 % IV SOLN
60.0000 mg/m2 | Freq: Once | INTRAVENOUS | Status: AC
  Administered 2019-06-06: 102 mg via INTRAVENOUS
  Filled 2019-06-06: qty 17

## 2019-06-06 MED ORDER — DEXAMETHASONE SODIUM PHOSPHATE 10 MG/ML IJ SOLN
4.0000 mg | Freq: Once | INTRAMUSCULAR | Status: AC
  Administered 2019-06-06: 4 mg via INTRAVENOUS

## 2019-06-06 MED ORDER — DIPHENHYDRAMINE HCL 50 MG/ML IJ SOLN
25.0000 mg | Freq: Once | INTRAMUSCULAR | Status: AC
  Administered 2019-06-06: 25 mg via INTRAVENOUS

## 2019-06-06 MED ORDER — FAMOTIDINE IN NACL 20-0.9 MG/50ML-% IV SOLN
20.0000 mg | Freq: Once | INTRAVENOUS | Status: AC
  Administered 2019-06-06: 20 mg via INTRAVENOUS

## 2019-06-06 MED ORDER — SODIUM CHLORIDE 0.9 % IV SOLN: 4.0000 mg | Freq: Once | INTRAVENOUS | Status: DC

## 2019-06-06 MED ORDER — SODIUM CHLORIDE 0.9 % IV SOLN
Freq: Once | INTRAVENOUS | Status: AC
  Administered 2019-06-06: 13:00:00 via INTRAVENOUS
  Filled 2019-06-06: qty 250

## 2019-06-06 MED ORDER — DEXAMETHASONE SODIUM PHOSPHATE 10 MG/ML IJ SOLN
INTRAMUSCULAR | Status: AC
  Filled 2019-06-06: qty 1

## 2019-06-06 MED ORDER — FAMOTIDINE IN NACL 20-0.9 MG/50ML-% IV SOLN
INTRAVENOUS | Status: AC
  Filled 2019-06-06: qty 50

## 2019-06-06 MED ORDER — SODIUM CHLORIDE 0.9% FLUSH
10.0000 mL | INTRAVENOUS | Status: DC | PRN
  Administered 2019-06-06: 10 mL
  Filled 2019-06-06: qty 10

## 2019-06-06 MED ORDER — PALONOSETRON HCL INJECTION 0.25 MG/5ML
0.2500 mg | Freq: Once | INTRAVENOUS | Status: AC
  Administered 2019-06-06: 0.25 mg via INTRAVENOUS

## 2019-06-06 MED ORDER — HEPARIN SOD (PORK) LOCK FLUSH 100 UNIT/ML IV SOLN
500.0000 [IU] | Freq: Once | INTRAVENOUS | Status: AC | PRN
  Administered 2019-06-06: 500 [IU]
  Filled 2019-06-06: qty 5

## 2019-06-06 MED ORDER — PALONOSETRON HCL INJECTION 0.25 MG/5ML
INTRAVENOUS | Status: AC
  Filled 2019-06-06: qty 5

## 2019-06-06 NOTE — Patient Instructions (Signed)
[facility] Discharge Instructions for Patients Receiving Chemotherapy  Today you received the following chemotherapy agents Taxol  To help prevent nausea and vomiting after your treatment, we encourage you to take your nausea medication as directed by your MD.   If you develop nausea and vomiting that is not controlled by your nausea medication, call the clinic.   BELOW ARE SYMPTOMS THAT SHOULD BE REPORTED IMMEDIATELY:  *FEVER GREATER THAN 100.5 F  *CHILLS WITH OR WITHOUT FEVER  NAUSEA AND VOMITING THAT IS NOT CONTROLLED WITH YOUR NAUSEA MEDICATION  *UNUSUAL SHORTNESS OF BREATH  *UNUSUAL BRUISING OR BLEEDING  TENDERNESS IN MOUTH AND THROAT WITH OR WITHOUT PRESENCE OF ULCERS  *URINARY PROBLEMS  *BOWEL PROBLEMS  UNUSUAL RASH Items with * indicate a potential emergency and should be followed up as soon as possible.  Feel free to call the clinic should you have any questions or concerns. The clinic phone number is [phone].  Please show the CHEMO ALERT CARD at check-in to the Emergency Department and triage nurse. Coronavirus (COVID-19) Are you at risk?  Are you at risk for the Coronavirus (COVID-19)?  To be considered HIGH RISK for Coronavirus (COVID-19), you have to meet the following criteria:  . Traveled to [place], [place], [place], [place] or [place]; or in the [place] to [place], [place], [place], or [place]; and have fever, cough, and shortness of breath within the last 2 weeks of travel OR . Been in close contact with a person diagnosed with COVID-19 within the last 2 weeks and have fever, cough, and shortness of breath . IF YOU DO NOT MEET THESE CRITERIA, YOU ARE CONSIDERED LOW RISK FOR COVID-19.  What to do if you are HIGH RISK for COVID-19?  . If you are having a medical emergency, call 911. . Seek medical care right away. Before you go to a doctor's office, urgent care or emergency department, call ahead and tell them about your  recent travel, contact with someone diagnosed with COVID-19, and your symptoms. You should receive instructions from your physician's office regarding next steps of care.  . When you arrive at healthcare provider, tell the healthcare staff immediately you have returned from visiting [place], [place], [place], [place] or [place]; or traveled in the [place] to [place], [place], [place], or [place]; in the last two weeks or you have been in close contact with a person diagnosed with COVID-19 in the last 2 weeks.   . Tell the health care staff about your symptoms: fever, cough and shortness of breath. . After you have been seen by a medical provider, you will be either: o Tested for (COVID-19) and discharged home on quarantine except to seek medical care if symptoms worsen, and asked to  - Stay home and avoid contact with others until you get your results (4-5 days)  - Avoid travel on public transportation if possible (such as bus, train, or airplane) or o Sent to the Emergency Department by EMS for evaluation, COVID-19 testing, and possible admission depending on your condition and test results.  What to do if you are LOW RISK for COVID-19?  Reduce your risk of any infection by using the same precautions used for avoiding the common cold or flu:  . Wash your hands often with soap and warm water for at least 20 seconds.  If soap and water are not readily available, use an alcohol-based hand sanitizer with at least 60% alcohol.  . If coughing   or sneezing, cover your mouth and nose by coughing or sneezing into the elbow areas of your shirt or coat, into a tissue or into your sleeve (not your hands). . Avoid shaking hands with others and consider head nods or verbal greetings only. . Avoid touching your eyes, nose, or mouth with unwashed hands.  . Avoid close contact with people who are sick. . Avoid places or events with large numbers of people in one location, like concerts or sporting  events. . Carefully consider travel plans you have or are making. . If you are planning any travel outside or inside the [place], visit the CDC's Travelers' Health webpage for the latest health notices. . If you have some symptoms but not all symptoms, continue to monitor at home and seek medical attention if your symptoms worsen. . If you are having a medical emergency, call 911.   ADDITIONAL HEALTHCARE OPTIONS FOR PATIENTS  [facility] / e-Visit: [email or web address]         [facility]: [phone]  [facility]: [phone]                   [facility]: [phone]    

## 2019-06-06 NOTE — Progress Notes (Signed)
Per Mendel Ryder NP, pt. ok for treatment today with elevated ALT and AST.

## 2019-06-06 NOTE — Progress Notes (Signed)
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   She has completed four cycles of adjuvant Doxorubicin and Cyclophosphamide, and is now receive weekly Paclitaxel.  Today is week 4 of her treatment.  She is tolerating this well.  She has had her Paclitaxel dose reduced by 25% due to elevated liver function.  Her liver function today is improved.    REVIEW OF SYSTEMS: Tonya Whitehead is feeling well.  She has no more skin peeling on her feet since the Paclitaxel was dose reduced.  She has no peripheral neuropathy.  She denies any fever, chills, chest pain, palpitations, cough, shortness of breath, nausea, vomiting, bowel/bladder changes, or any other concerns.  A detailed ROS was otherwise non contributory.   HISTORY OF CURRENT ILLNESS: From the original intake note:  Tonya Whitehead notes a history of reconstructive left breast surgery in 2002 while living in [place], [place]. That was for symmetry. The left breast accordingly had some scar tissue and she thought that was what she was noting until mid March, when she felt the left breast was changing more and the nipple seemed to be sinking subtly.  She brought this to Dr Dahlia Bailiff attention and underwent bilateral  diagnostic mammography with tomography and bilateral breast ultrasonography at [facility] on 12/01/2018 showing: breast density category C; findings concerning for extensive malignancy throughout the left breast predominately involving the upper- and lower-outer quadrants.  The mass was palpable within the upper outer left breast and there was left nipple retraction as well as indentation along the inferior margin of the left breast.  Targeted ultrasound showed three masses measuring 5.6 cm (2 o'clock position), 4.0 cm (close to the nipple and contiguous to the prior mass), and 3.3 cm (6 o'clock position.).  Also noted was a borderline left axillary lymph node adjacent to the left axillary artery (and so not easy to biopsy).  In the right breast was noted a 1.3 cm cyst.  On 12/08/2018 [name] proceeded to biopsy of 2 of the left breast areas in question. The pathology from this procedure ([id number]) showed: invasive mammary carcinoma, grade 2, in two cores (at 2 o'clock and 6 o'clock) with identical histomorphology; e-cadherin is negative, consistent with a lobular phenotype. Prognostic indicators significant for: estrogen receptor, 70% positive and progesterone receptor, 70% positive, both with strong staining intensity. Proliferation marker Ki67 at 3%. HER2 negative by immunohistochemistry (1+).  Note also the patient had a thyroid ultrasound 07/18/2018 showing bilateral solitary nodules, which were biopsied 08/09/2018.  Both showed follicular nodules, Bethesda category 2 (benign).  The patient's subsequent history is as detailed below.   PAST MEDICAL HISTORY: Past Medical History:  Diagnosis Date  . Breast cancer ([place]) 12/08/2018   invasive lobular  . Family history of bladder cancer   . Family history of colon cancer   . Family history of lung cancer   .  Family history of ovarian cancer     PAST SURGICAL HISTORY: Past Surgical History:  Procedure Laterality Date  . ADENOIDECTOMY      as a child  . BREAST EXCISIONAL BIOPSY Left 2002   reconstructive surgery on nipple to match right breast  . IR IMAGING GUIDED PORT INSERTION  03/08/2019  Adenoidectomy as a child.   FAMILY HISTORY: Family History  Problem Relation Age of Onset  . Lung cancer Mother 35       smoker  . Ovarian cancer Mother 42  . Colon cancer Maternal Aunt        42  . Bladder Cancer Paternal Grandfather        death was unrelated  . Cancer Paternal Aunt        through [facility], blood cancer   As of May 2020 patient's father is alive at age 42. Patient's mother is also living at age 42. She has ovarian cancer, diagnosed in 2019, and lung cancer (stage IV), diagnosed in 2016. She tested negative for BRCA.  A maternal aunt had colon cancer in her 50s.  3 other siblings of the patient's mother have not had any cancer.  On the paternal side the paternal grandmother had bladder cancer.  A paternal great aunt died from cancer of the pancreas.     GYNECOLOGIC HISTORY:  No LMP recorded. Menarche: 43 years old Age at first live birth: 43 years old GX P 3 LMP every month, 5 total days with 2 heavy Contraceptive no, husband with vasectomy. HRT n/a  Hysterectomy? no BSO? 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 Husband [name] is a physical therapist. He is biologically [place] and was adopted by a [place] family.  The patient lives at home with her husband and their 3 children: [name] is 42, [name] is 39, and  [name], 42.  The patient attends a [employer].    ADVANCED DIRECTIVES: Husband [name] is her HCPOA   HEALTH MAINTENANCE: Social History   Tobacco Use  . Smoking status: Never Smoker  . Smokeless tobacco: Never Used  Substance Use Topics  . Alcohol use: Not on file  . Drug use: Never     Colonoscopy: never done  PAP: not on file  Bone density: never done   Allergies  Allergen Reactions  . Gluten Meal Hives    Joint pain, upset  stomach  . Sulfa Antibiotics     Unknown reaction " I was told I had a reaction when I was a baby "    Current Outpatient Medications  Medication Sig Dispense Refill  . ciprofloxacin (CIPRO) 500 MG tablet Take one tablet twice daily for 5 days starting day 8 after each chemotherapy treatment 40 tablet 1  . dexamethasone (DECADRON) 4 MG tablet Take 2 tablets by mouth once a day on the day after chemotherapy and then take 2 tablets two times a day for 2 days. Take with food. 30 tablet 1  . levothyroxine (SYNTHROID) 25 MCG tablet Take 1 tablet (25 mcg total) by mouth daily before breakfast. 60 tablet 1  . lidocaine-prilocaine (EMLA) cream Apply to affected area once 30 g 3  . loratadine (CLARITIN) 10 MG tablet Take 10 mg by mouth daily.    Marland Kitchen LORazepam (ATIVAN) 0.5 MG tablet TAKE ONE TABLET BY MOUTH EVERY 6 HOURS AS NEEDED FOR NAUSEA 40 tablet 0  . metoCLOPramide (REGLAN) 5 MG tablet Take 1 tablet (5 mg total) by  mouth 4 (four) times daily -  before meals and at bedtime. 40 tablet 3  . ondansetron (ZOFRAN-ODT) 4 MG disintegrating tablet     . polyethylene glycol (MIRALAX / GLYCOLAX) 17 g packet Take 17 g by mouth daily as needed for mild constipation.    . prochlorperazine (COMPAZINE) 10 MG tablet Take 1 tablet (10 mg total) by mouth every 6 (six) hours as needed (Nausea or vomiting). 30 tablet 1  . promethazine (PHENERGAN) 25 MG tablet Take 1 tablet (25 mg total) by mouth every 6 (six) hours as needed for nausea or vomiting. 30 tablet 1  . PROMETHEGAN 25 MG suppository INSERT 1 SUPPOSITORY RECTALLY EVERY 6 HOURS AS NEEDED FOR NAUSEA OR VOMITING *USE IF ACTIVELY VOMITING* 12 suppository 0  . venlafaxine XR (EFFEXOR-XR) 37.5 MG 24 hr capsule Take 1 capsule (37.5 mg total) by mouth daily with breakfast. 30 capsule 0   No current facility-administered medications for this visit.     OBJECTIVE:   Vitals:   06/06/19 1208  BP: 109/63  Pulse: 77  Resp: 16  Temp: 98.5 F (36.9 C)  SpO2: 100%    Wt Readings from Last 3 Encounters:  06/06/19 140 lb 8 oz (63.7 kg)  05/30/19 137 lb 14.4 oz (62.6 kg)  05/16/19 135 lb 4.8 oz (61.4 kg)   Body mass index is 24.12 kg/m.    ECOG FS:1 - Symptomatic but completely ambulatory GENERAL: Patient is a well appearing female in no acute distress HEENT:  Sclerae anicteric.  Oropharynx clear and moist. No ulcerations or evidence of oropharyngeal candidiasis. Neck is supple.  NODES:  No cervical, supraclavicular, or axillary lymphadenopathy palpated.  BREAST EXAM:  Deferred. LUNGS:  Clear to auscultation bilaterally.  No wheezes or rhonchi. HEART:  Regular rate and rhythm. No murmur appreciated. ABDOMEN:  Soft, nontender.  Positive, normoactive bowel sounds. No organomegaly palpated. MSK:  No focal spinal tenderness to palpation. Full range of motion bilaterally in the upper extremities. EXTREMITIES:  No peripheral edema.   SKIN:  Clear with no obvious rashes or skin changes. No nail dyscrasia. NEURO:  Nonfocal. Well oriented.  Appropriate affect.      LAB RESULTS:  CMP     Component Value Date/Time   NA 139 05/30/2019 1005   K 4.1 05/30/2019 1005   CL 105 05/30/2019 1005   CO2 23 05/30/2019 1005   GLUCOSE 108 (H) 05/30/2019 1005   BUN 10 05/30/2019 1005   CREATININE 0.67 05/30/2019 1005   CREATININE 0.85 03/20/2019 1022   CALCIUM 9.3 05/30/2019 1005   PROT 6.8 05/30/2019 1005   ALBUMIN 4.1 05/30/2019 1005   AST 47 (H) 05/30/2019 1005   AST 13 (L) 03/20/2019 1022   ALT 131 (H) 05/30/2019 1005   ALT 40 03/20/2019 1022   ALKPHOS 71 05/30/2019 1005   BILITOT 0.5 05/30/2019 1005   BILITOT 0.7 03/20/2019 1022   GFRNONAA >60 05/30/2019 1005   GFRNONAA >60 03/20/2019 1022   GFRAA >60 05/30/2019 1005   GFRAA >60 03/20/2019 1022    No results found for: TOTALPROTELP, ALBUMINELP, A1GS, A2GS, BETS, BETA2SER, GAMS, MSPIKE, SPEI  No results found for: KPAFRELGTCHN, LAMBDASER, KAPLAMBRATIO  Lab Results  Component Value Date   WBC  5.1 06/06/2019   NEUTROABS 3.8 06/06/2019   HGB 11.2 (L) 06/06/2019   HCT 33.3 (L) 06/06/2019   MCV 94.1 06/06/2019   PLT 230 06/06/2019    _0 @  No results found for: LABCA2  No components found for: KDXIPJ825  No  results for input(s): INR in the last 168 hours.  No results found for: LABCA2  No results found for: XEN407  No results found for: WKG881  No results found for: JSR159  No results found for: CA2729  No components found for: HGQUANT  No results found for: CEA1 / No results found for: CEA1   No results found for: AFPTUMOR  No results found for: CHROMOGRNA  No results found for: PSA1  Appointment on 06/06/2019  Component Date Value Ref Range Status  . WBC 06/06/2019 5.1  4.0 - 10.5 K/uL Final  . RBC 06/06/2019 3.54* 3.87 - 5.11 MIL/uL Final  . Hemoglobin 06/06/2019 11.2* 12.0 - 15.0 g/dL Final  . HCT 06/06/2019 33.3* 36.0 - 46.0 % Final  . MCV 06/06/2019 94.1  80.0 - 100.0 fL Final  . MCH 06/06/2019 31.6  26.0 - 34.0 pg Final  . MCHC 06/06/2019 33.6  30.0 - 36.0 g/dL Final  . RDW 06/06/2019 15.6* 11.5 - 15.5 % Final  . Platelets 06/06/2019 230  150 - 400 K/uL Final  . nRBC 06/06/2019 0.0  0.0 - 0.2 % Final  . Neutrophils Relative % 06/06/2019 74  % Final  . Neutro Abs 06/06/2019 3.8  1.7 - 7.7 K/uL Final  . Lymphocytes Relative 06/06/2019 11  % Final  . Lymphs Abs 06/06/2019 0.6* 0.7 - 4.0 K/uL Final  . Monocytes Relative 06/06/2019 7  % Final  . Monocytes Absolute 06/06/2019 0.4  0.1 - 1.0 K/uL Final  . Eosinophils Relative 06/06/2019 6  % Final  . Eosinophils Absolute 06/06/2019 0.3  0.0 - 0.5 K/uL Final  . Basophils Relative 06/06/2019 1  % Final  . Basophils Absolute 06/06/2019 0.0  0.0 - 0.1 K/uL Final  . Immature Granulocytes 06/06/2019 1  % Final  . Abs Immature Granulocytes 06/06/2019 0.06  0.00 - 0.07 K/uL Final   Performed at [facility], [address] [place], [place] [address]    (this displays  the last labs from the last 3 days)  No results found for: TOTALPROTELP, ALBUMINELP, A1GS, A2GS, BETS, BETA2SER, GAMS, MSPIKE, SPEI (this displays SPEP labs)  No results found for: KPAFRELGTCHN, LAMBDASER, KAPLAMBRATIO (kappa/lambda light chains)  No results found for: HGBA, HGBA2QUANT, HGBFQUANT, HGBSQUAN (Hemoglobinopathy evaluation)   No results found for: LDH  No results found for: IRON, TIBC, IRONPCTSAT (Iron and TIBC)  No results found for: FERRITIN  Urinalysis    Component Value Date/Time   COLORURINE STRAW (A) 05/09/2019 [place] 05/09/2019 1234   LABSPEC 1.003 (L) 05/09/2019 1234   PHURINE 6.0 05/09/2019 [place] 05/09/2019 [place] 05/09/2019 [place] 05/09/2019 [place] 05/09/2019 [place] 05/09/2019 1234   NITRITE NEGATIVE 05/09/2019 [place] 05/09/2019 1234     STUDIES: No results found.   ELIGIBLE FOR AVAILABLE RESEARCH PROTOCOL: no  ASSESSMENT: 43 y.o. [place] woman status post left breast biopsy x2 on 12/08/2018, for multicentric invasive lobular breast cancer, grade 2, estrogen and progesterone receptor positive, HER-2 nonamplified, with an MIB-1 of 3%  (a) bilateral breast MRI 12/20/2018 shows multiple masses throughout the left breast, with nipple retraction, but no evidence of chest wall invasion, no abnormal left axillary lymph nodes, and no findings of concern in the right breast  (b) chest CT scan and bone scan 12/29/2018 show only an incidental goiter  (1) status post left skin sparing mastectomy  at [facility] 01/16/2019 for an mpT3 pN1, stage IIA invasive lobular carcinoma, grade 2; repeat prognostic panel again estrogen and progesterone receptor strongly positive, HER-2 nonamplified.  (a) a total of 8 lymph nodes removed, one with macrometastasis (8 mm, no ECE)  (2) adjuvant chemotherapy will consist of doxorubicin and  cyclophosphamide in dose dense fashion x4 starting 03/13/2019, to be followed by weekly paclitaxel x12  (a) echo 03/09/2019 finds normal left ventricular function at 55-60%  (b) cycle 2 doxorubicin and cyclophosphamide delayed 1 week secondary to nausea problems  (3) adjuvant radiation to follow  (4) antiestrogens to start at the completion of local treatment  (5) genetics testing 01/09/2019 through the [facility] STAT Breast Cancer Panel + Common Hereditary Cancers Panel found no deleterious mutations in ATM, BRCA1, BRCA2, CDH1, CHEK2, PALB2, PTEN, STK11 and TP53, APC, ATM, AXIN2, BARD1, BMPR1A, BRCA1, BRCA2, BRIP1, CDH1, CDKN2A (p14ARF), CDKN2A (p16INK4a), CKD4, CHEK2, CTNNA1, DICER1, EPCAM (Deletion/duplication testing only), GREM1 (promoter region deletion/duplication testing only), KIT, MEN1, MLH1, MSH2, MSH3, MSH6, MUTYH, NBN, NF1, NHTL1, PALB2, PDGFRA, PMS2, POLD1, POLE, PTEN, RAD50, RAD51C, RAD51D SDHB, SDHC, SDHD, SMAD4, SMARCA4. STK11, TP53, TSC1, TSC2, and VHL.  The following genes were evaluated for sequence changes only: SDHA and HOXB13 c.251G>A variant only.   PLAN: Sheilyn is doing well today.  She is tolerating treatment well.  Her liver enzymes have decreased. She has no peripheral neuropathy.  She will continue on Paclitaxel weekly with the dose reduction.  I reviewed this with Dr. Jana Hakim who is in agreement.    Miri will return next week for labs, f/u and her next treatment.    Ne[place]ha understands the above plan.  She was recommended to continue with the appropriate pandemic precautions. She knows to call for any questions that may arise between now and her next appointment.  We are happy to see her sooner if needed.  A total of (20) minutes of face-to-face time was spent with this patient with greater than 50% of that time in counseling and care-coordination.   Wilber Bihari, NP  06/06/19 12:14 PM Medical Oncology and Hematology [facility] [place], [place] [address] Tel. [phone]    Fax. [phone]

## 2019-06-13 ENCOUNTER — Other Ambulatory Visit: Payer: Self-pay

## 2019-06-13 ENCOUNTER — Inpatient Hospital Stay: Payer: 59

## 2019-06-13 ENCOUNTER — Other Ambulatory Visit: Payer: Self-pay | Admitting: Oncology

## 2019-06-13 ENCOUNTER — Other Ambulatory Visit: Payer: Self-pay | Admitting: Adult Health

## 2019-06-13 ENCOUNTER — Encounter: Payer: Self-pay | Admitting: Adult Health

## 2019-06-13 ENCOUNTER — Inpatient Hospital Stay (HOSPITAL_BASED_OUTPATIENT_CLINIC_OR_DEPARTMENT_OTHER): Payer: 59 | Admitting: Adult Health

## 2019-06-13 VITALS — BP 111/68 | HR 79 | Temp 98.3°F | Resp 19 | Wt 140.0 lb

## 2019-06-13 DIAGNOSIS — C50812 Malignant neoplasm of overlapping sites of left female breast: Secondary | ICD-10-CM

## 2019-06-13 DIAGNOSIS — Z170 Estrogen receptor positive status [ER+]: Secondary | ICD-10-CM | POA: Diagnosis not present

## 2019-06-13 DIAGNOSIS — Z5111 Encounter for antineoplastic chemotherapy: Secondary | ICD-10-CM | POA: Diagnosis not present

## 2019-06-13 DIAGNOSIS — Z23 Encounter for immunization: Secondary | ICD-10-CM

## 2019-06-13 LAB — CBC WITH DIFFERENTIAL/PLATELET
Abs Immature Granulocytes: 0.03 10*3/uL (ref 0.00–0.07)
Basophils Absolute: 0 10*3/uL (ref 0.0–0.1)
Basophils Relative: 1 %
Eosinophils Absolute: 0.2 10*3/uL (ref 0.0–0.5)
Eosinophils Relative: 5 %
HCT: 35.3 % — ABNORMAL LOW (ref 36.0–46.0)
Hemoglobin: 11.6 g/dL — ABNORMAL LOW (ref 12.0–15.0)
Immature Granulocytes: 1 %
Lymphocytes Relative: 11 %
Lymphs Abs: 0.4 10*3/uL — ABNORMAL LOW (ref 0.7–4.0)
MCH: 31.4 pg (ref 26.0–34.0)
MCHC: 32.9 g/dL (ref 30.0–36.0)
MCV: 95.7 fL (ref 80.0–100.0)
Monocytes Absolute: 0.4 10*3/uL (ref 0.1–1.0)
Monocytes Relative: 10 %
Neutro Abs: 2.4 10*3/uL (ref 1.7–7.7)
Neutrophils Relative %: 72 %
Platelets: 254 10*3/uL (ref 150–400)
RBC: 3.69 MIL/uL — ABNORMAL LOW (ref 3.87–5.11)
RDW: 15.1 % (ref 11.5–15.5)
WBC: 3.4 10*3/uL — ABNORMAL LOW (ref 4.0–10.5)
nRBC: 0 % (ref 0.0–0.2)

## 2019-06-13 LAB — COMPREHENSIVE METABOLIC PANEL
ALT: 106 U/L — ABNORMAL HIGH (ref 0–44)
AST: 38 U/L (ref 15–41)
Albumin: 4.2 g/dL (ref 3.5–5.0)
Alkaline Phosphatase: 78 U/L (ref 38–126)
Anion gap: 9 (ref 5–15)
BUN: 11 mg/dL (ref 6–20)
CO2: 25 mmol/L (ref 22–32)
Calcium: 9.4 mg/dL (ref 8.9–10.3)
Chloride: 104 mmol/L (ref 98–111)
Creatinine, Ser: 0.7 mg/dL (ref 0.44–1.00)
GFR calc Af Amer: >60 mL/min (ref >60)
GFR calc non Af Amer: >60 mL/min (ref >60)
Glucose, Bld: 120 mg/dL — ABNORMAL HIGH (ref 70–99)
Potassium: 4 mmol/L (ref 3.5–5.1)
Sodium: 138 mmol/L (ref 135–145)
Total Bilirubin: 0.5 mg/dL (ref 0.3–1.2)
Total Protein: 7.1 g/dL (ref 6.5–8.1)

## 2019-06-13 MED ORDER — INFLUENZA VAC SPLIT QUAD 0.5 ML IM SUSY
0.5000 mL | PREFILLED_SYRINGE | Freq: Once | INTRAMUSCULAR | Status: AC
  Administered 2019-06-13: 0.5 mL via INTRAMUSCULAR

## 2019-06-13 MED ORDER — SODIUM CHLORIDE 0.9 % IV SOLN: 4.0000 mg | Freq: Once | INTRAVENOUS | Status: DC

## 2019-06-13 MED ORDER — SODIUM CHLORIDE 0.9 % IV SOLN
Freq: Once | INTRAVENOUS | Status: AC
  Administered 2019-06-13: 11:00:00 via INTRAVENOUS
  Filled 2019-06-13: qty 250

## 2019-06-13 MED ORDER — DIPHENHYDRAMINE HCL 50 MG/ML IJ SOLN
25.0000 mg | Freq: Once | INTRAMUSCULAR | Status: AC
  Administered 2019-06-13: 25 mg via INTRAVENOUS

## 2019-06-13 MED ORDER — FAMOTIDINE IN NACL 20-0.9 MG/50ML-% IV SOLN
20.0000 mg | Freq: Once | INTRAVENOUS | Status: AC
  Administered 2019-06-13: 20 mg via INTRAVENOUS

## 2019-06-13 MED ORDER — DIPHENHYDRAMINE HCL 50 MG/ML IJ SOLN
INTRAMUSCULAR | Status: AC
  Filled 2019-06-13: qty 1

## 2019-06-13 MED ORDER — SODIUM CHLORIDE 0.9% FLUSH
10.0000 mL | Freq: Once | INTRAVENOUS | Status: AC | PRN
  Administered 2019-06-13: 10 mL
  Filled 2019-06-13: qty 10

## 2019-06-13 MED ORDER — SODIUM CHLORIDE 0.9% FLUSH
10.0000 mL | INTRAVENOUS | Status: DC | PRN
  Administered 2019-06-13: 10 mL
  Filled 2019-06-13: qty 10

## 2019-06-13 MED ORDER — INFLUENZA VAC SPLIT QUAD 0.5 ML IM SUSY
PREFILLED_SYRINGE | INTRAMUSCULAR | Status: AC
  Filled 2019-06-13: qty 0.5

## 2019-06-13 MED ORDER — SODIUM CHLORIDE 0.9 % IV SOLN
60.0000 mg/m2 | Freq: Once | INTRAVENOUS | Status: AC
  Administered 2019-06-13: 102 mg via INTRAVENOUS
  Filled 2019-06-13: qty 17

## 2019-06-13 MED ORDER — PALONOSETRON HCL INJECTION 0.25 MG/5ML
0.2500 mg | Freq: Once | INTRAVENOUS | Status: AC
  Administered 2019-06-13: 0.25 mg via INTRAVENOUS

## 2019-06-13 MED ORDER — DEXAMETHASONE SODIUM PHOSPHATE 10 MG/ML IJ SOLN
4.0000 mg | Freq: Once | INTRAMUSCULAR | Status: AC
  Administered 2019-06-13: 4 mg via INTRAVENOUS

## 2019-06-13 MED ORDER — PALONOSETRON HCL INJECTION 0.25 MG/5ML
INTRAVENOUS | Status: AC
  Filled 2019-06-13: qty 5

## 2019-06-13 MED ORDER — HEPARIN SOD (PORK) LOCK FLUSH 100 UNIT/ML IV SOLN
500.0000 [IU] | Freq: Once | INTRAVENOUS | Status: AC | PRN
  Administered 2019-06-13: 500 [IU]
  Filled 2019-06-13: qty 5

## 2019-06-13 MED ORDER — DEXAMETHASONE SODIUM PHOSPHATE 10 MG/ML IJ SOLN
INTRAMUSCULAR | Status: AC
  Filled 2019-06-13: qty 1

## 2019-06-13 MED ORDER — FAMOTIDINE IN NACL 20-0.9 MG/50ML-% IV SOLN
INTRAVENOUS | Status: AC
  Filled 2019-06-13: qty 50

## 2019-06-13 NOTE — Progress Notes (Signed)
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   She has completed four cycles of adjuvant Doxorubicin and Cyclophosphamide, and is now receive weekly Paclitaxel.  Today is week 5 of her treatment.  She is tolerating this well.  She has had her Paclitaxel dose reduced by 25% due to elevated liver function.    REVIEW OF SYSTEMS: Tonya Whitehead is doing well today.  She has no new issues.  She is without fever, chills, chest pain, palpitations, cough, neuropathy, skin changes, vision issues, headaches, nausea, vomiting, bowel/bladder issues.  A detailed ROS was otherwise non contributory.    HISTORY OF CURRENT ILLNESS: From the original intake note:  Tonya Whitehead notes a history of reconstructive left breast surgery in 2002 while living in [place], [place]. That was for symmetry. The left breast accordingly had some scar tissue and she thought that was what she was noting until mid March, when she felt the left breast was changing more and the nipple seemed to be sinking subtly.  She brought this to Dr Dahlia Bailiff attention and underwent bilateral diagnostic mammography with tomography and bilateral breast ultrasonography at [facility] on 12/01/2018 showing: breast density category C; findings concerning for extensive malignancy throughout the left breast  predominately involving the upper- and lower-outer quadrants.  The mass was palpable within the upper outer left breast and there was left nipple retraction as well as indentation along the inferior margin of the left breast.  Targeted ultrasound showed three masses measuring 5.6 cm (2 o'clock position), 4.0 cm (close to the nipple and contiguous to the prior mass), and 3.3 cm (6 o'clock position.).  Also noted was a borderline left axillary lymph node adjacent to the left axillary artery (and so not easy to biopsy).  In the right breast was noted a 1.3 cm cyst.  On 12/08/2018 Tonya Whitehead proceeded to biopsy of 2 of the left breast areas in question. The pathology from this procedure ([id number]) showed: invasive mammary carcinoma, grade 2, in two cores (at 2 o'clock and 6 o'clock) with identical histomorphology; e-cadherin is negative, consistent with a lobular phenotype. Prognostic indicators significant for: estrogen receptor, 70% positive and progesterone receptor, 70% positive, both with strong staining intensity. Proliferation marker Ki67 at 3%. HER2 negative by immunohistochemistry (1+).  Note also the patient had a thyroid ultrasound 07/18/2018 showing bilateral solitary nodules, which were biopsied 08/09/2018.  Both showed follicular nodules, Bethesda category 2 (benign).  The patient's subsequent history is as detailed below.   PAST MEDICAL HISTORY: Past Medical History:  Diagnosis Date  . Breast cancer ([place]) 12/08/2018   invasive lobular  . Family history of bladder cancer   . Family history of colon cancer   . Family history of lung cancer   . Family history of ovarian cancer     PAST SURGICAL HISTORY: Past Surgical History:  Procedure Laterality Date  . ADENOIDECTOMY     as a child  . BREAST EXCISIONAL BIOPSY Left 2002  reconstructive surgery on nipple to match right breast  . IR IMAGING GUIDED PORT INSERTION  03/08/2019  Adenoidectomy as a child.   FAMILY HISTORY: Family  History  Problem Relation Age of Onset  . Lung cancer Mother 81       smoker  . Ovarian cancer Mother 25  . Colon cancer Maternal Aunt        42s  . Bladder Cancer Paternal Grandfather        death was unrelated  . Cancer Paternal Aunt        through [facility], blood cancer   As of May 2020 patient's father is alive at age 27. Patient's mother is also living at age 54. She has ovarian cancer, diagnosed in 2019, and lung cancer (stage IV), diagnosed in 2016. She tested negative for BRCA.  A maternal aunt had colon cancer in her 71s.  3 other siblings of the patient's mother have not had any cancer.  On the paternal side the paternal grandmother had bladder cancer.  A paternal great aunt died from cancer of the pancreas.     GYNECOLOGIC HISTORY:  No LMP recorded. Menarche: 43 years old Age at first live birth: 43 years old GX P 3 LMP every month, 5 total days with 2 heavy Contraceptive no, husband with vasectomy. HRT n/a  Hysterectomy? no BSO? 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 Husband [name] is a physical therapist. He is biologically [place] and was adopted by a [place] family.  The patient lives at home with her husband and their 3 children: [name] is 59, [name] is 74, and  [name], 66.  The patient attends a [employer].    ADVANCED DIRECTIVES: Husband [name] is her HCPOA   HEALTH MAINTENANCE: Social History   Tobacco Use  . Smoking status: Never Smoker  . Smokeless tobacco: Never Used  Substance Use Topics  . Alcohol use: Not on file  . Drug use: Never     Colonoscopy: never done  PAP: not on file  Bone density: never done   Allergies  Allergen Reactions  . Gluten Meal Hives    Joint pain, upset stomach  . Sulfa Antibiotics     Unknown reaction " I was told I had a reaction when I was a baby "    Current Outpatient Medications  Medication Sig Dispense Refill  . ciprofloxacin (CIPRO) 500 MG tablet Take  one tablet twice daily for 5 days starting day 8 after each chemotherapy treatment 40 tablet 1  . dexamethasone (DECADRON) 4 MG tablet Take 2 tablets by mouth once a day on the day after chemotherapy and then take 2 tablets two times a day for 2 days. Take with food. 30 tablet 1  . levothyroxine (SYNTHROID) 25 MCG tablet Take 1 tablet (25 mcg total) by mouth daily before breakfast. 60 tablet 1  . lidocaine-prilocaine (EMLA) cream Apply to affected area once 30 g 3  . loratadine (CLARITIN) 10 MG tablet Take 10 mg by mouth daily.    Marland Kitchen LORazepam (ATIVAN) 0.5 MG tablet TAKE ONE TABLET BY MOUTH EVERY 6 HOURS AS NEEDED FOR NAUSEA 40 tablet 0  . metoCLOPramide (REGLAN) 5 MG tablet Take 1 tablet (5 mg total) by mouth 4 (four) times daily -  before meals and at bedtime. 40 tablet 3  . ondansetron (ZOFRAN-ODT) 4 MG disintegrating tablet     . polyethylene glycol (MIRALAX / GLYCOLAX) 17 g packet Take 17  g by mouth daily as needed for mild constipation.    . prochlorperazine (COMPAZINE) 10 MG tablet Take 1 tablet (10 mg total) by mouth every 6 (six) hours as needed (Nausea or vomiting). 30 tablet 1  . promethazine (PHENERGAN) 25 MG tablet Take 1 tablet (25 mg total) by mouth every 6 (six) hours as needed for nausea or vomiting. 30 tablet 1  . PROMETHEGAN 25 MG suppository INSERT 1 SUPPOSITORY RECTALLY EVERY 6 HOURS AS NEEDED FOR NAUSEA OR VOMITING *USE IF ACTIVELY VOMITING* 12 suppository 0  . venlafaxine XR (EFFEXOR-XR) 37.5 MG 24 hr capsule Take 1 capsule (37.5 mg total) by mouth daily with breakfast. 30 capsule 0   No current facility-administered medications for this visit.     OBJECTIVE:   Vitals:   06/13/19 1043  BP: 111/68  Pulse: 79  Resp: 19  Temp: 98.3 F (36.8 C)  SpO2: 100%   Wt Readings from Last 3 Encounters:  06/13/19 140 lb (63.5 kg)  06/06/19 140 lb 8 oz (63.7 kg)  05/30/19 137 lb 14.4 oz (62.6 kg)   Body mass index is 24.03 kg/m.    ECOG FS:1 - Symptomatic but completely  ambulatory GENERAL: Patient is a well appearing female in no acute distress HEENT:  Sclerae anicteric.  Oropharynx clear and moist. No ulcerations or evidence of oropharyngeal candidiasis. Neck is supple.  NODES:  No cervical, supraclavicular, or axillary lymphadenopathy palpated.  BREAST EXAM:  Deferred. LUNGS:  Clear to auscultation bilaterally.  No wheezes or rhonchi. HEART:  Regular rate and rhythm. No murmur appreciated. ABDOMEN:  Soft, nontender.  Positive, normoactive bowel sounds. No organomegaly palpated. MSK:  No focal spinal tenderness to palpation. Full range of motion bilaterally in the upper extremities. EXTREMITIES:  No peripheral edema.   SKIN:  Clear with no obvious rashes or skin changes. No nail dyscrasia. NEURO:  Nonfocal. Well oriented.  Appropriate affect.      LAB RESULTS:  CMP     Component Value Date/Time   NA 138 06/13/2019 1021   K 4.0 06/13/2019 1021   CL 104 06/13/2019 1021   CO2 25 06/13/2019 1021   GLUCOSE 120 (H) 06/13/2019 1021   BUN 11 06/13/2019 1021   CREATININE 0.70 06/13/2019 1021   CREATININE 0.85 03/20/2019 1022   CALCIUM 9.4 06/13/2019 1021   PROT 7.1 06/13/2019 1021   ALBUMIN 4.2 06/13/2019 1021   AST 38 06/13/2019 1021   AST 13 (L) 03/20/2019 1022   ALT 106 (H) 06/13/2019 1021   ALT 40 03/20/2019 1022   ALKPHOS 78 06/13/2019 1021   BILITOT 0.5 06/13/2019 1021   BILITOT 0.7 03/20/2019 1022   GFRNONAA >60 06/13/2019 1021   GFRNONAA >60 03/20/2019 1022   GFRAA >60 06/13/2019 1021   GFRAA >60 03/20/2019 1022    No results found for: TOTALPROTELP, ALBUMINELP, A1GS, A2GS, BETS, BETA2SER, GAMS, MSPIKE, SPEI  No results found for: KPAFRELGTCHN, LAMBDASER, KAPLAMBRATIO  Lab Results  Component Value Date   WBC 3.4 (L) 06/13/2019   NEUTROABS 2.4 06/13/2019   HGB 11.6 (L) 06/13/2019   HCT 35.3 (L) 06/13/2019   MCV 95.7 06/13/2019   PLT 254 06/13/2019    _0 @  No results found for: LABCA2  No components found  for: FIEPPI951  No results for input(s): INR in the last 168 hours.  No results found for: LABCA2  No results found for: OAC166  No results found for: AYT016  No results found for: WFU932  No results found for: TF5732  No components found for: HGQUANT  No results found for: CEA1 / No results found for: CEA1   No results found for: AFPTUMOR  No results found for: Divide  No results found for: PSA1  Appointment on 06/13/2019  Component Date Value Ref Range Status  . Sodium 06/13/2019 138  135 - 145 mmol/L Final  . Potassium 06/13/2019 4.0  3.5 - 5.1 mmol/L Final  . Chloride 06/13/2019 104  98 - 111 mmol/L Final  . CO2 06/13/2019 25  22 - 32 mmol/L Final  . Glucose, Bld 06/13/2019 120* 70 - 99 mg/dL Final  . BUN 06/13/2019 11  6 - 20 mg/dL Final  . Creatinine, Ser 06/13/2019 0.70  0.44 - 1.00 mg/dL Final  . Calcium 06/13/2019 9.4  8.9 - 10.3 mg/dL Final  . Total Protein 06/13/2019 7.1  6.5 - 8.1 g/dL Final  . Albumin 06/13/2019 4.2  3.5 - 5.0 g/dL Final  . AST 06/13/2019 38  15 - 41 U/L Final  . ALT 06/13/2019 106* 0 - 44 U/L Final  . Alkaline Phosphatase 06/13/2019 78  38 - 126 U/L Final  . Total Bilirubin 06/13/2019 0.5  0.3 - 1.2 mg/dL Final  . GFR calc non Af Amer 06/13/2019 >60  >60 mL/min Final  . GFR calc Af Amer 06/13/2019 >60  >60 mL/min Final  . Anion gap 06/13/2019 9  5 - 15 Final   Performed at [facility], [place] [address], [place], [place] [address]  . WBC 06/13/2019 3.4* 4.0 - 10.5 K/uL Final  . RBC 06/13/2019 3.69* 3.87 - 5.11 MIL/uL Final  . Hemoglobin 06/13/2019 11.6* 12.0 - 15.0 g/dL Final  . HCT 06/13/2019 35.3* 36.0 - 46.0 % Final  . MCV 06/13/2019 95.7  80.0 - 100.0 fL Final  . MCH 06/13/2019 31.4  26.0 - 34.0 pg Final  . MCHC 06/13/2019 32.9  30.0 - 36.0 g/dL Final  . RDW 06/13/2019 15.1  11.5 - 15.5 % Final  . Platelets 06/13/2019 254  150 - 400 K/uL Final  . nRBC 06/13/2019 0.0  0.0 - 0.2 % Final  . Neutrophils  Relative % 06/13/2019 72  % Final  . Neutro Abs 06/13/2019 2.4  1.7 - 7.7 K/uL Final  . Lymphocytes Relative 06/13/2019 11  % Final  . Lymphs Abs 06/13/2019 0.4* 0.7 - 4.0 K/uL Final  . Monocytes Relative 06/13/2019 10  % Final  . Monocytes Absolute 06/13/2019 0.4  0.1 - 1.0 K/uL Final  . Eosinophils Relative 06/13/2019 5  % Final  . Eosinophils Absolute 06/13/2019 0.2  0.0 - 0.5 K/uL Final  . Basophils Relative 06/13/2019 1  % Final  . Basophils Absolute 06/13/2019 0.0  0.0 - 0.1 K/uL Final  . Immature Granulocytes 06/13/2019 1  % Final  . Abs Immature Granulocytes 06/13/2019 0.03  0.00 - 0.07 K/uL Final   Performed at [facility], [place] [address] [place], [place] [address]    (this displays the last labs from the last 3 days)  No results found for: TOTALPROTELP, ALBUMINELP, A1GS, A2GS, BETS, BETA2SER, GAMS, MSPIKE, SPEI (this displays SPEP labs)  No results found for: KPAFRELGTCHN, LAMBDASER, KAPLAMBRATIO (kappa/lambda light chains)  No results found for: HGBA, HGBA2QUANT, HGBFQUANT, HGBSQUAN (Hemoglobinopathy evaluation)   No results found for: LDH  No results found for: IRON, TIBC, IRONPCTSAT (Iron and TIBC)  No results found for: FERRITIN  Urinalysis    Component Value Date/Time   COLORURINE STRAW (A) 05/09/2019 1234  APPEARANCEUR CLEAR 05/09/2019 1234   LABSPEC 1.003 (L) 05/09/2019 1234   PHURINE 6.0 05/09/2019 [place] 05/09/2019 [place] 05/09/2019 [place] 05/09/2019 [place] 05/09/2019 1234   PROTEINUR NEGATIVE 05/09/2019 1234   NITRITE NEGATIVE 05/09/2019 1234   LEUKOCYTESUR NEGATIVE 05/09/2019 1234     STUDIES: No results found.   ELIGIBLE FOR AVAILABLE RESEARCH PROTOCOL: no  ASSESSMENT: 43 y.o. [place] woman status post left breast biopsy x2 on 12/08/2018, for multicentric invasive lobular breast cancer, grade 2, estrogen and progesterone receptor  positive, HER-2 nonamplified, with an MIB-1 of 3%  (a) bilateral breast MRI 12/20/2018 shows multiple masses throughout the left breast, with nipple retraction, but no evidence of chest wall invasion, no abnormal left axillary lymph nodes, and no findings of concern in the right breast  (b) chest CT scan and bone scan 12/29/2018 show only an incidental goiter  (1) status post left skin sparing mastectomy at [facility] 01/16/2019 for an mpT3 pN1, stage IIA invasive lobular carcinoma, grade 2; repeat prognostic panel again estrogen and progesterone receptor strongly positive, HER-2 nonamplified.  (a) a total of 8 lymph nodes removed, one with macrometastasis (8 mm, no ECE)  (2) adjuvant chemotherapy will consist of doxorubicin and cyclophosphamide in dose dense fashion x4 starting 03/13/2019, to be followed by weekly paclitaxel x12  (a) echo 03/09/2019 finds normal left ventricular function at 55-60%  (b) cycle 2 doxorubicin and cyclophosphamide delayed 1 week secondary to nausea problems  (3) adjuvant radiation to follow  (4) antiestrogens to start at the completion of local treatment  (5) genetics testing 01/09/2019 through the [facility] STAT Breast Cancer Panel + Common Hereditary Cancers Panel found no deleterious mutations in ATM, BRCA1, BRCA2, CDH1, CHEK2, PALB2, PTEN, STK11 and TP53, APC, ATM, AXIN2, BARD1, BMPR1A, BRCA1, BRCA2, BRIP1, CDH1, CDKN2A (p14ARF), CDKN2A (p16INK4a), CKD4, CHEK2, CTNNA1, DICER1, EPCAM (Deletion/duplication testing only), GREM1 (promoter region deletion/duplication testing only), KIT, MEN1, MLH1, MSH2, MSH3, MSH6, MUTYH, NBN, NF1, NHTL1, PALB2, PDGFRA, PMS2, POLD1, POLE, PTEN, RAD50, RAD51C, RAD51D SDHB, SDHC, SDHD, SMAD4, SMARCA4. STK11, TP53, TSC1, TSC2, and VHL.  The following genes were evaluated for sequence changes only: SDHA and HOXB13 c.251G>A variant only.   PLAN: Tonya Whitehead is doing well today.  She continues on therapy with reduced dose Paclitaxel and is tolerating it  well.  Her LFTs remain stable and I reviewed her results with her in detail.  She has no peripheral neuropathy.  She will proceed with chemotherapy today.  Tonya Whitehead continues to eat and drink well, and her hair is beginning to grow back.  She will continues this.  She will return weekly for her treatment and we will see her with every other treatment until she gets to cycle 9.  At that point we will see her weekly.  She was recommended to continue with the appropriate pandemic precautions. She knows to call for any questions that may arise between now and her next appointment.  We are happy to see her sooner if needed.  A total of (20) minutes of face-to-face time was spent with this patient with greater than 50% of that time in counseling and care-coordination.   Wilber Bihari, NP  06/13/19 11:13 AM Medical Oncology and Hematology [facility] [place], [place] [address] Tel. [phone]    Fax. [phone]

## 2019-06-13 NOTE — Patient Instructions (Signed)
[facility] Discharge Instructions for Patients Receiving Chemotherapy  Today you received the following chemotherapy agents Taxol  To help prevent nausea and vomiting after your treatment, we encourage you to take your nausea medication as directed by your MD.   If you develop nausea and vomiting that is not controlled by your nausea medication, call the clinic.   BELOW ARE SYMPTOMS THAT SHOULD BE REPORTED IMMEDIATELY:  *FEVER GREATER THAN 100.5 F  *CHILLS WITH OR WITHOUT FEVER  NAUSEA AND VOMITING THAT IS NOT CONTROLLED WITH YOUR NAUSEA MEDICATION  *UNUSUAL SHORTNESS OF BREATH  *UNUSUAL BRUISING OR BLEEDING  TENDERNESS IN MOUTH AND THROAT WITH OR WITHOUT PRESENCE OF ULCERS  *URINARY PROBLEMS  *BOWEL PROBLEMS  UNUSUAL RASH Items with * indicate a potential emergency and should be followed up as soon as possible.  Feel free to call the clinic should you have any questions or concerns. The clinic phone number is [phone].  Please show the CHEMO ALERT CARD at check-in to the Emergency Department and triage nurse. Coronavirus (COVID-19) Are you at risk?  Are you at risk for the Coronavirus (COVID-19)?  To be considered HIGH RISK for Coronavirus (COVID-19), you have to meet the following criteria:  . Traveled to [place], [place], [place], [place] or [place]; or in the [place] to [place], [place], [place], or [place]; and have fever, cough, and shortness of breath within the last 2 weeks of travel OR . Been in close contact with a person diagnosed with COVID-19 within the last 2 weeks and have fever, cough, and shortness of breath . IF YOU DO NOT MEET THESE CRITERIA, YOU ARE CONSIDERED LOW RISK FOR COVID-19.  What to do if you are HIGH RISK for COVID-19?  . If you are having a medical emergency, call 911. . Seek medical care right away. Before you go to a doctor's office, urgent care or emergency department, call ahead and tell them about your  recent travel, contact with someone diagnosed with COVID-19, and your symptoms. You should receive instructions from your physician's office regarding next steps of care.  . When you arrive at healthcare provider, tell the healthcare staff immediately you have returned from visiting [place], [place], [place], [place] or [place]; or traveled in the [place] to [place], [place], [place], or [place]; in the last two weeks or you have been in close contact with a person diagnosed with COVID-19 in the last 2 weeks.   . Tell the health care staff about your symptoms: fever, cough and shortness of breath. . After you have been seen by a medical provider, you will be either: o Tested for (COVID-19) and discharged home on quarantine except to seek medical care if symptoms worsen, and asked to  - Stay home and avoid contact with others until you get your results (4-5 days)  - Avoid travel on public transportation if possible (such as bus, train, or airplane) or o Sent to the Emergency Department by EMS for evaluation, COVID-19 testing, and possible admission depending on your condition and test results.  What to do if you are LOW RISK for COVID-19?  Reduce your risk of any infection by using the same precautions used for avoiding the common cold or flu:  . Wash your hands often with soap and warm water for at least 20 seconds.  If soap and water are not readily available, use an alcohol-based hand sanitizer with at least 60% alcohol.  . If coughing   or sneezing, cover your mouth and nose by coughing or sneezing into the elbow areas of your shirt or coat, into a tissue or into your sleeve (not your hands). . Avoid shaking hands with others and consider head nods or verbal greetings only. . Avoid touching your eyes, nose, or mouth with unwashed hands.  . Avoid close contact with people who are sick. . Avoid places or events with large numbers of people in one location, like concerts or sporting  events. . Carefully consider travel plans you have or are making. . If you are planning any travel outside or inside the [place], visit the CDC's Travelers' Health webpage for the latest health notices. . If you have some symptoms but not all symptoms, continue to monitor at home and seek medical attention if your symptoms worsen. . If you are having a medical emergency, call 911.   ADDITIONAL HEALTHCARE OPTIONS FOR PATIENTS  [facility] / e-Visit: [email or web address]         [facility]: [phone]  [facility]: [phone]                   [facility]: [phone]    

## 2019-06-13 NOTE — Patient Instructions (Signed)

## 2019-06-20 ENCOUNTER — Telehealth: Payer: Self-pay | Admitting: *Deleted

## 2019-06-20 ENCOUNTER — Inpatient Hospital Stay: Payer: 59

## 2019-06-20 ENCOUNTER — Other Ambulatory Visit: Payer: Self-pay

## 2019-06-20 VITALS — BP 115/68 | HR 65 | Temp 98.2°F | Resp 18

## 2019-06-20 DIAGNOSIS — Z170 Estrogen receptor positive status [ER+]: Secondary | ICD-10-CM

## 2019-06-20 DIAGNOSIS — C50812 Malignant neoplasm of overlapping sites of left female breast: Secondary | ICD-10-CM

## 2019-06-20 DIAGNOSIS — Z5111 Encounter for antineoplastic chemotherapy: Secondary | ICD-10-CM | POA: Diagnosis not present

## 2019-06-20 LAB — CBC WITH DIFFERENTIAL/PLATELET
Abs Immature Granulocytes: 0.05 10*3/uL (ref 0.00–0.07)
Basophils Absolute: 0 10*3/uL (ref 0.0–0.1)
Basophils Relative: 1 %
Eosinophils Absolute: 0.1 10*3/uL (ref 0.0–0.5)
Eosinophils Relative: 2 %
HCT: 35.2 % — ABNORMAL LOW (ref 36.0–46.0)
Hemoglobin: 11.6 g/dL — ABNORMAL LOW (ref 12.0–15.0)
Immature Granulocytes: 1 %
Lymphocytes Relative: 11 %
Lymphs Abs: 0.5 10*3/uL — ABNORMAL LOW (ref 0.7–4.0)
MCH: 31.7 pg (ref 26.0–34.0)
MCHC: 33 g/dL (ref 30.0–36.0)
MCV: 96.2 fL (ref 80.0–100.0)
Monocytes Absolute: 0.4 10*3/uL (ref 0.1–1.0)
Monocytes Relative: 9 %
Neutro Abs: 3.4 10*3/uL (ref 1.7–7.7)
Neutrophils Relative %: 76 %
Platelets: 288 10*3/uL (ref 150–400)
RBC: 3.66 MIL/uL — ABNORMAL LOW (ref 3.87–5.11)
RDW: 14.9 % (ref 11.5–15.5)
WBC: 4.4 10*3/uL (ref 4.0–10.5)
nRBC: 0 % (ref 0.0–0.2)

## 2019-06-20 LAB — COMPREHENSIVE METABOLIC PANEL
ALT: 101 U/L — ABNORMAL HIGH (ref 0–44)
AST: 45 U/L — ABNORMAL HIGH (ref 15–41)
Albumin: 4.1 g/dL (ref 3.5–5.0)
Alkaline Phosphatase: 73 U/L (ref 38–126)
Anion gap: 9 (ref 5–15)
BUN: 13 mg/dL (ref 6–20)
CO2: 25 mmol/L (ref 22–32)
Calcium: 9.3 mg/dL (ref 8.9–10.3)
Chloride: 106 mmol/L (ref 98–111)
Creatinine, Ser: 0.69 mg/dL (ref 0.44–1.00)
GFR calc Af Amer: >60 mL/min (ref >60)
GFR calc non Af Amer: >60 mL/min (ref >60)
Glucose, Bld: 105 mg/dL — ABNORMAL HIGH (ref 70–99)
Potassium: 4.1 mmol/L (ref 3.5–5.1)
Sodium: 140 mmol/L (ref 135–145)
Total Bilirubin: 0.4 mg/dL (ref 0.3–1.2)
Total Protein: 6.9 g/dL (ref 6.5–8.1)

## 2019-06-20 MED ORDER — SODIUM CHLORIDE 0.9 % IV SOLN
Freq: Once | INTRAVENOUS | Status: AC
  Administered 2019-06-20: 10:00:00 via INTRAVENOUS
  Filled 2019-06-20: qty 250

## 2019-06-20 MED ORDER — DEXAMETHASONE SODIUM PHOSPHATE 10 MG/ML IJ SOLN
INTRAMUSCULAR | Status: AC
  Filled 2019-06-20: qty 1

## 2019-06-20 MED ORDER — DIPHENHYDRAMINE HCL 50 MG/ML IJ SOLN
25.0000 mg | Freq: Once | INTRAMUSCULAR | Status: AC
  Administered 2019-06-20: 25 mg via INTRAVENOUS

## 2019-06-20 MED ORDER — PALONOSETRON HCL INJECTION 0.25 MG/5ML
0.2500 mg | Freq: Once | INTRAVENOUS | Status: AC
  Administered 2019-06-20: 0.25 mg via INTRAVENOUS

## 2019-06-20 MED ORDER — SODIUM CHLORIDE 0.9% FLUSH
10.0000 mL | INTRAVENOUS | Status: DC | PRN
  Administered 2019-06-20: 10 mL
  Filled 2019-06-20: qty 10

## 2019-06-20 MED ORDER — FAMOTIDINE IN NACL 20-0.9 MG/50ML-% IV SOLN
INTRAVENOUS | Status: AC
  Filled 2019-06-20: qty 50

## 2019-06-20 MED ORDER — SODIUM CHLORIDE 0.9% FLUSH
10.0000 mL | Freq: Once | INTRAVENOUS | Status: AC | PRN
  Administered 2019-06-20: 10 mL
  Filled 2019-06-20: qty 10

## 2019-06-20 MED ORDER — PALONOSETRON HCL INJECTION 0.25 MG/5ML
INTRAVENOUS | Status: AC
  Filled 2019-06-20: qty 5

## 2019-06-20 MED ORDER — HEPARIN SOD (PORK) LOCK FLUSH 100 UNIT/ML IV SOLN
500.0000 [IU] | Freq: Once | INTRAVENOUS | Status: AC | PRN
  Administered 2019-06-20: 500 [IU]
  Filled 2019-06-20: qty 5

## 2019-06-20 MED ORDER — FAMOTIDINE IN NACL 20-0.9 MG/50ML-% IV SOLN
20.0000 mg | Freq: Once | INTRAVENOUS | Status: AC
  Administered 2019-06-20: 20 mg via INTRAVENOUS

## 2019-06-20 MED ORDER — DEXAMETHASONE SODIUM PHOSPHATE 10 MG/ML IJ SOLN
4.0000 mg | Freq: Once | INTRAMUSCULAR | Status: AC
  Administered 2019-06-20: 4 mg via INTRAVENOUS

## 2019-06-20 MED ORDER — SODIUM CHLORIDE 0.9 % IV SOLN
60.0000 mg/m2 | Freq: Once | INTRAVENOUS | Status: AC
  Administered 2019-06-20: 102 mg via INTRAVENOUS
  Filled 2019-06-20: qty 17

## 2019-06-20 MED ORDER — DIPHENHYDRAMINE HCL 50 MG/ML IJ SOLN
INTRAMUSCULAR | Status: AC
  Filled 2019-06-20: qty 1

## 2019-06-20 NOTE — Telephone Encounter (Signed)
Per MD ok to treat with noted elevated AST and ALT

## 2019-06-20 NOTE — Patient Instructions (Signed)

## 2019-06-20 NOTE — Patient Instructions (Signed)
[facility] Discharge Instructions for Patients Receiving Chemotherapy  Today you received the following chemotherapy agents:  Taxol.  To help prevent nausea and vomiting after your treatment, we encourage you to take your nausea medication as directed.   If you develop nausea and vomiting that is not controlled by your nausea medication, call the clinic.   BELOW ARE SYMPTOMS THAT SHOULD BE REPORTED IMMEDIATELY:  *FEVER GREATER THAN 100.5 F  *CHILLS WITH OR WITHOUT FEVER  NAUSEA AND VOMITING THAT IS NOT CONTROLLED WITH YOUR NAUSEA MEDICATION  *UNUSUAL SHORTNESS OF BREATH  *UNUSUAL BRUISING OR BLEEDING  TENDERNESS IN MOUTH AND THROAT WITH OR WITHOUT PRESENCE OF ULCERS  *URINARY PROBLEMS  *BOWEL PROBLEMS  UNUSUAL RASH Items with * indicate a potential emergency and should be followed up as soon as possible.  Feel free to call the clinic should you have any questions or concerns. The clinic phone number is [phone].  Please show the CHEMO ALERT CARD at check-in to the Emergency Department and triage nurse.   

## 2019-06-26 NOTE — Progress Notes (Signed)
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   She completed four cycles of adjuvant Doxorubicin and Cyclophosphamide, and is now receive weekly Paclitaxel.  Today is week 7 of her treatment.     She has had her Paclitaxel dose reduced by 25% due to elevated liver function.  After that she has tolerated it much better.  She is no longer having plantar peeling for example.  She has had no peripheral neuropathy at any point.   REVIEW OF SYSTEMS: Tonya Whitehead has some fatigue from the treatment.  She is able to walk a couple of miles without too much difficulty.  She is gained some weight at least 5 pounds she thinks.  She is having "horrendous" hot flashes.  She does not want to try venlafaxine.  She is also having insomnia.  She is having some emotional lability.  She has not had a.  Since her first chemotherapy dose.  Insured she is currently entering menopause.  Aside from these issues a detailed review of systems today was stable.    HISTORY OF CURRENT ILLNESS: From the original intake note:  Tonya Whitehead notes a history of reconstructive left breast surgery in 2002 while living in [place], [place]. That was for symmetry. The left breast accordingly had some scar tissue and she thought that was what she was noting until mid March, when she felt the  left breast was changing more and the nipple seemed to be sinking subtly.  She brought this to Dr Dahlia Bailiff attention and underwent bilateral diagnostic mammography with tomography and bilateral breast ultrasonography at [facility] on 12/01/2018 showing: breast density category C; findings concerning for extensive malignancy throughout the left breast predominately involving the upper- and lower-outer quadrants.  The mass was palpable within the upper outer left breast and there was left nipple retraction as well as indentation along the inferior margin of the left breast.  Targeted ultrasound showed three masses measuring 5.6 cm (2 o'clock position), 4.0 cm (close to the nipple and contiguous to the prior mass), and 3.3 cm (6 o'clock position.).  Also noted was a borderline left axillary lymph node adjacent to the left axillary artery (and so not easy to biopsy).  In the right breast was noted a 1.3 cm cyst.  On 12/08/2018 Tonya Whitehead proceeded to biopsy of 2 of the left breast areas in question. The pathology from this procedure ([id number]) showed: invasive mammary carcinoma, grade 2, in two cores (at 2 o'clock and 6 o'clock) with identical histomorphology; e-cadherin is negative, consistent with a lobular phenotype. Prognostic indicators significant for: estrogen receptor, 70% positive and progesterone receptor, 70% positive, both with strong staining intensity. Proliferation marker Ki67 at 3%. HER2 negative by immunohistochemistry (1+).  Note also the patient had a thyroid ultrasound 07/18/2018 showing bilateral solitary nodules, which were biopsied 08/09/2018.  Both showed follicular nodules, Bethesda category 2 (benign).  The patient's subsequent history is as detailed below.   PAST MEDICAL HISTORY:  Past Medical History:  Diagnosis Date   Breast cancer ([place]) 12/08/2018   invasive lobular   Family history of bladder cancer    Family history of colon cancer    Family history of lung  cancer    Family history of ovarian cancer     PAST SURGICAL HISTORY: Past Surgical History:  Procedure Laterality Date   ADENOIDECTOMY     as a child   BREAST EXCISIONAL BIOPSY Left 2002   reconstructive surgery on nipple to match right breast   IR IMAGING GUIDED PORT INSERTION  03/08/2019  Adenoidectomy as a child.   FAMILY HISTORY: Family History  Problem Relation Age of Onset   Lung cancer Mother 57       smoker   Ovarian cancer Mother 50   Colon cancer Maternal Aunt        27s   Bladder Cancer Paternal Grandfather        death was unrelated   Cancer Paternal Aunt        through [facility], blood cancer   As of May 2020 patient's father is alive at age 32. Patient's mother is also living at age 5. She has ovarian cancer, diagnosed in 2019, and lung cancer (stage IV), diagnosed in 2016. She tested negative for BRCA.  A maternal aunt had colon cancer in her 18s.  3 other siblings of the patient's mother have not had any cancer.  On the paternal side the paternal grandmother had bladder cancer.  A paternal great aunt died from cancer of the pancreas.     GYNECOLOGIC HISTORY:  No LMP recorded. Menarche: 43 years old Age at first live birth: 43 years old GX P 3 LMP every month, 5 total days with 2 heavy Contraceptive no, husband with vasectomy. HRT n/a  Hysterectomy? no BSO? 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 Husband [name] is a physical therapist. He is biologically [place] and was adopted by a [place] family.  The patient lives at home with her husband and their 3 children: [name] is 26, [name] is 2, and  [name], 56.  The patient attends a [employer].    ADVANCED DIRECTIVES: Husband [name] is her HCPOA   HEALTH MAINTENANCE: Social History   Tobacco Use   Smoking status: Never Smoker   Smokeless tobacco: Never Used  Substance Use Topics   Alcohol use: Not on file   Drug use: Never      Colonoscopy: never done  PAP: not on file  Bone density: never done   Allergies  Allergen Reactions   Gluten Meal Hives    Joint pain, upset stomach   Sulfa Antibiotics     Unknown reaction " I was told I had a reaction when I was a baby "    Current Outpatient Medications  Medication Sig Dispense Refill   ciprofloxacin (CIPRO) 500 MG tablet Take one tablet twice daily for 5 days starting day 8 after each chemotherapy treatment 40 tablet 1   dexamethasone (DECADRON) 4 MG tablet Take 2 tablets by mouth once a day on the day after chemotherapy and then take 2 tablets two times a day for 2 days. Take with food. 30 tablet 1   levothyroxine (SYNTHROID) 25 MCG tablet Take 1 tablet (25 mcg total) by mouth daily before breakfast. 60 tablet 1   lidocaine-prilocaine (EMLA) cream Apply to affected area once 30 g 3   loratadine (CLARITIN) 10 MG tablet Take 10  mg by mouth daily.     LORazepam (ATIVAN) 0.5 MG tablet TAKE ONE TABLET BY MOUTH EVERY 6 HOURS AS NEEDED FOR NAUSEA 40 tablet 0   metoCLOPramide (REGLAN) 5 MG tablet Take 1 tablet (5 mg total) by mouth 4 (four) times daily -  before meals and at bedtime. 40 tablet 3   ondansetron (ZOFRAN-ODT) 4 MG disintegrating tablet      polyethylene glycol (MIRALAX / GLYCOLAX) 17 g packet Take 17 g by mouth daily as needed for mild constipation.     prochlorperazine (COMPAZINE) 10 MG tablet Take 1 tablet (10 mg total) by mouth every 6 (six) hours as needed (Nausea or vomiting). 30 tablet 1   promethazine (PHENERGAN) 25 MG tablet Take 1 tablet (25 mg total) by mouth every 6 (six) hours as needed for nausea or vomiting. 30 tablet 1   PROMETHEGAN 25 MG suppository INSERT 1 SUPPOSITORY RECTALLY EVERY 6 HOURS AS NEEDED FOR NAUSEA OR VOMITING *USE IF ACTIVELY VOMITING* 12 suppository 0   venlafaxine XR (EFFEXOR-XR) 37.5 MG 24 hr capsule Take 1 capsule (37.5 mg total) by mouth daily with breakfast. 30 capsule 0   No current  facility-administered medications for this visit.     OBJECTIVE: Young white woman in no acute distress  Vitals:   06/27/19 1014  BP: 108/72  Pulse: 85  Resp: 18  Temp: 98.3 F (36.8 C)  SpO2: 100%   Wt Readings from Last 3 Encounters:  06/27/19 144 lb 1.6 oz (65.4 kg)  06/13/19 140 lb (63.5 kg)  06/06/19 140 lb 8 oz (63.7 kg)   Body mass index is 24.73 kg/m.    ECOG FS:1 - Symptomatic but completely ambulatory  Hair is coming in nicely Sclerae unicteric, EOMs intact Wearing a mask No cervical or supraclavicular adenopathy Lungs no rales or rhonchi Heart regular rate and rhythm Abd soft, nontender, positive bowel sounds MSK no focal spinal tenderness, no upper extremity lymphedema Neuro: nonfocal, well oriented, appropriate affect Breasts: Deferred   LAB RESULTS:  CMP     Component Value Date/Time   NA 140 06/27/2019 1000   K 4.4 06/27/2019 1000   CL 106 06/27/2019 1000   CO2 25 06/27/2019 1000   GLUCOSE 110 (H) 06/27/2019 1000   BUN 9 06/27/2019 1000   CREATININE 0.66 06/27/2019 1000   CREATININE 0.85 03/20/2019 1022   CALCIUM 9.4 06/27/2019 1000   PROT 6.8 06/27/2019 1000   ALBUMIN 4.1 06/27/2019 1000   AST 39 06/27/2019 1000   AST 13 (L) 03/20/2019 1022   ALT 88 (H) 06/27/2019 1000   ALT 40 03/20/2019 1022   ALKPHOS 72 06/27/2019 1000   BILITOT 0.3 06/27/2019 1000   BILITOT 0.7 03/20/2019 1022   GFRNONAA >60 06/27/2019 1000   GFRNONAA >60 03/20/2019 1022   GFRAA >60 06/27/2019 1000   GFRAA >60 03/20/2019 1022    No results found for: TOTALPROTELP, ALBUMINELP, A1GS, A2GS, BETS, BETA2SER, GAMS, MSPIKE, SPEI  No results found for: KPAFRELGTCHN, LAMBDASER, KAPLAMBRATIO  Lab Results  Component Value Date   WBC 3.6 (L) 06/27/2019   NEUTROABS 2.6 06/27/2019   HGB 11.9 (L) 06/27/2019   HCT 36.5 06/27/2019   MCV 97.1 06/27/2019   PLT 270 06/27/2019    No results found for: LABCA2  No components found for: RCVELF810  No results for input(s):  INR in the last 168 hours.  No results found for: LABCA2  No results found for: FBP102  No results found for: HEN277  No results found  for: EPP295  No results found for: CA2729  No components found for: HGQUANT  No results found for: CEA1 / No results found for: CEA1   No results found for: AFPTUMOR  No results found for: [place]  No results found for: PSA1  Appointment on 06/27/2019  Component Date Value Ref Range Status   Sodium 06/27/2019 140  135 - 145 mmol/L Final   Potassium 06/27/2019 4.4  3.5 - 5.1 mmol/L Final   Chloride 06/27/2019 106  98 - 111 mmol/L Final   CO2 06/27/2019 25  22 - 32 mmol/L Final   Glucose, Bld 06/27/2019 110* 70 - 99 mg/dL Final   BUN 06/27/2019 9  6 - 20 mg/dL Final   Creatinine, Ser 06/27/2019 0.66  0.44 - 1.00 mg/dL Final   Calcium 06/27/2019 9.4  8.9 - 10.3 mg/dL Final   Total Protein 06/27/2019 6.8  6.5 - 8.1 g/dL Final   Albumin 06/27/2019 4.1  3.5 - 5.0 g/dL Final   AST 06/27/2019 39  15 - 41 U/L Final   ALT 06/27/2019 88* 0 - 44 U/L Final   Alkaline Phosphatase 06/27/2019 72  38 - 126 U/L Final   Total Bilirubin 06/27/2019 0.3  0.3 - 1.2 mg/dL Final   GFR calc non Af Amer 06/27/2019 >60  >60 mL/min Final   GFR calc Af Amer 06/27/2019 >60  >60 mL/min Final   Anion gap 06/27/2019 9  5 - 15 Final   Performed at [facility], [place] [address] [place], [place] [address]   WBC 06/27/2019 3.6* 4.0 - 10.5 K/uL Final   RBC 06/27/2019 3.76* 3.87 - 5.11 MIL/uL Final   Hemoglobin 06/27/2019 11.9* 12.0 - 15.0 g/dL Final   HCT 06/27/2019 36.5  36.0 - 46.0 % Final   MCV 06/27/2019 97.1  80.0 - 100.0 fL Final   MCH 06/27/2019 31.6  26.0 - 34.0 pg Final   MCHC 06/27/2019 32.6  30.0 - 36.0 g/dL Final   RDW 06/27/2019 14.9  11.5 - 15.5 % Final   Platelets 06/27/2019 270  150 - 400 K/uL Final   nRBC 06/27/2019 0.0  0.0 - 0.2 % Final   Neutrophils Relative % 06/27/2019 73  % Final   Neutro  Abs 06/27/2019 2.6  1.7 - 7.7 K/uL Final   Lymphocytes Relative 06/27/2019 12  % Final   Lymphs Abs 06/27/2019 0.4* 0.7 - 4.0 K/uL Final   Monocytes Relative 06/27/2019 11  % Final   Monocytes Absolute 06/27/2019 0.4  0.1 - 1.0 K/uL Final   Eosinophils Relative 06/27/2019 2  % Final   Eosinophils Absolute 06/27/2019 0.1  0.0 - 0.5 K/uL Final   Basophils Relative 06/27/2019 1  % Final   Basophils Absolute 06/27/2019 0.0  0.0 - 0.1 K/uL Final   Immature Granulocytes 06/27/2019 1  % Final   Abs Immature Granulocytes 06/27/2019 0.05  0.00 - 0.07 K/uL Final   Performed at [facility], [place] [address] [place], [place] [address]    (this displays the last labs from the last 3 days)  No results found for: TOTALPROTELP, ALBUMINELP, A1GS, A2GS, BETS, BETA2SER, GAMS, MSPIKE, SPEI (this displays SPEP labs)  No results found for: KPAFRELGTCHN, LAMBDASER, KAPLAMBRATIO (kappa/lambda light chains)  No results found for: HGBA, HGBA2QUANT, HGBFQUANT, HGBSQUAN (Hemoglobinopathy evaluation)   No results found for: LDH  No results found for: IRON, TIBC, IRONPCTSAT (Iron and TIBC)  No results found for: FERRITIN  Urinalysis    Component Value  Date/Time   COLORURINE STRAW (A) 05/09/2019 [place] 05/09/2019 1234   LABSPEC 1.003 (L) 05/09/2019 1234   PHURINE 6.0 05/09/2019 1234   GLUCOSEU NEGATIVE 05/09/2019 1234   HGBUR NEGATIVE 05/09/2019 [place] 05/09/2019 [place] 05/09/2019 1234   PROTEINUR NEGATIVE 05/09/2019 1234   NITRITE NEGATIVE 05/09/2019 1234   LEUKOCYTESUR NEGATIVE 05/09/2019 1234     STUDIES: No results found.   ELIGIBLE FOR AVAILABLE RESEARCH PROTOCOL: no  ASSESSMENT: 43 y.o. [place] woman status post left breast biopsy x2 on 12/08/2018, for multicentric invasive lobular breast cancer, grade 2, estrogen and progesterone receptor positive, HER-2 nonamplified, with an MIB-1 of  3%  (a) bilateral breast MRI 12/20/2018 shows multiple masses throughout the left breast, with nipple retraction, but no evidence of chest wall invasion, no abnormal left axillary lymph nodes, and no findings of concern in the right breast  (b) chest CT scan and bone scan 12/29/2018 show only an incidental goiter  (1) status post left skin sparing mastectomy at [facility] 01/16/2019 for an mpT3 pN1, stage IIA invasive lobular carcinoma, grade 2; repeat prognostic panel again estrogen and progesterone receptor strongly positive, HER-2 nonamplified.  (a) a total of 8 lymph nodes removed, one with macrometastasis (8 mm, no ECE)  (2) adjuvant chemotherapy will consist of doxorubicin and cyclophosphamide in dose dense fashion x4 starting 03/13/2019, to be followed by weekly paclitaxel x12  (a) echo 03/09/2019 finds normal left ventricular function at 55-60%  (b) cycle 2 doxorubicin and cyclophosphamide delayed 1 week secondary to nausea problems  (3) adjuvant radiation to follow  (4) antiestrogens to start at the completion of local treatment  (5) genetics testing 01/09/2019 through the [facility] STAT Breast Cancer Panel + Common Hereditary Cancers Panel found no deleterious mutations in ATM, BRCA1, BRCA2, CDH1, CHEK2, PALB2, PTEN, STK11 and TP53, APC, ATM, AXIN2, BARD1, BMPR1A, BRCA1, BRCA2, BRIP1, CDH1, CDKN2A (p14ARF), CDKN2A (p16INK4a), CKD4, CHEK2, CTNNA1, DICER1, EPCAM (Deletion/duplication testing only), GREM1 (promoter region deletion/duplication testing only), KIT, MEN1, MLH1, MSH2, MSH3, MSH6, MUTYH, NBN, NF1, NHTL1, PALB2, PDGFRA, PMS2, POLD1, POLE, PTEN, RAD50, RAD51C, RAD51D SDHB, SDHC, SDHD, SMAD4, SMARCA4. STK11, TP53, TSC1, TSC2, and VHL.  The following genes were evaluated for sequence changes only: SDHA and HOXB13 c.251G>A variant only.   (6) vasomotor symptoms  PLAN: Tonya Whitehead is tolerating weekly paclitaxel well and she will proceed to the seventh of 12 planned cycles today.  We are going  to continue to see her every other cycle.  She knows that if she develops the slightest hint of neuropathy she needs to let us know immediately.  She has not yet met with radiation oncology.  She will be done with her chemo early January and she will meet with radiation oncology shortly after that  She is planning to have total thyroidectomy at [facility] under Dr. Cena Benton probably best scheduled at the completion of radiation  Tonya Whitehead had many questions regarding menopause.  She very likely will regain ovarian function.  We will need to monitor that.  In any case I am comfortable with her receiving tamoxifen and not proceeding to ovarian suppression with Zoladex once she completes radiation.  Of course we will discuss this further  She wanted to know her long-term prognosis.  I think given the chemo that she received and the antiestrogens that she will receive a 15% lifetime risk of recurrence is in the ballpark.  She finds this encouraging.  She will see [place] again in 2  weeks.  She knows to call for any other issue that may develop before that visit. Virgie Dad. Tonya Depasquale, MD  06/27/19 10:48 AM Medical Oncology and Hematology [facility] [place], [place] [address] Tel. [phone]    Fax. [phone]   I, Wilburn Mylar, am acting as scribe for Dr. Virgie Dad. Namish Krise.  I, Lurline Del MD, have reviewed the above documentation for accuracy and completeness, and I agree with the above.

## 2019-06-27 ENCOUNTER — Other Ambulatory Visit: Payer: Self-pay

## 2019-06-27 ENCOUNTER — Inpatient Hospital Stay: Payer: 59

## 2019-06-27 ENCOUNTER — Inpatient Hospital Stay: Payer: 59 | Attending: Oncology

## 2019-06-27 ENCOUNTER — Inpatient Hospital Stay (HOSPITAL_BASED_OUTPATIENT_CLINIC_OR_DEPARTMENT_OTHER): Payer: 59 | Admitting: Oncology

## 2019-06-27 VITALS — BP 108/72 | HR 85 | Temp 98.3°F | Resp 18 | Ht 64.0 in | Wt 144.1 lb

## 2019-06-27 DIAGNOSIS — Z5111 Encounter for antineoplastic chemotherapy: Secondary | ICD-10-CM | POA: Insufficient documentation

## 2019-06-27 DIAGNOSIS — C50812 Malignant neoplasm of overlapping sites of left female breast: Secondary | ICD-10-CM

## 2019-06-27 DIAGNOSIS — Z170 Estrogen receptor positive status [ER+]: Secondary | ICD-10-CM | POA: Insufficient documentation

## 2019-06-27 DIAGNOSIS — C50412 Malignant neoplasm of upper-outer quadrant of left female breast: Secondary | ICD-10-CM | POA: Insufficient documentation

## 2019-06-27 DIAGNOSIS — Z79899 Other long term (current) drug therapy: Secondary | ICD-10-CM | POA: Diagnosis not present

## 2019-06-27 DIAGNOSIS — R232 Flushing: Secondary | ICD-10-CM | POA: Diagnosis not present

## 2019-06-27 DIAGNOSIS — Z9012 Acquired absence of left breast and nipple: Secondary | ICD-10-CM | POA: Diagnosis not present

## 2019-06-27 DIAGNOSIS — R110 Nausea: Secondary | ICD-10-CM | POA: Diagnosis not present

## 2019-06-27 DIAGNOSIS — G629 Polyneuropathy, unspecified: Secondary | ICD-10-CM | POA: Insufficient documentation

## 2019-06-27 DIAGNOSIS — Z90722 Acquired absence of ovaries, bilateral: Secondary | ICD-10-CM | POA: Diagnosis not present

## 2019-06-27 LAB — CBC WITH DIFFERENTIAL/PLATELET
Abs Immature Granulocytes: 0.05 K/uL (ref 0.00–0.07)
Basophils Absolute: 0 K/uL (ref 0.0–0.1)
Basophils Relative: 1 %
Eosinophils Absolute: 0.1 K/uL (ref 0.0–0.5)
Eosinophils Relative: 2 %
HCT: 36.5 % (ref 36.0–46.0)
Hemoglobin: 11.9 g/dL — ABNORMAL LOW (ref 12.0–15.0)
Immature Granulocytes: 1 %
Lymphocytes Relative: 12 %
Lymphs Abs: 0.4 K/uL — ABNORMAL LOW (ref 0.7–4.0)
MCH: 31.6 pg (ref 26.0–34.0)
MCHC: 32.6 g/dL (ref 30.0–36.0)
MCV: 97.1 fL (ref 80.0–100.0)
Monocytes Absolute: 0.4 K/uL (ref 0.1–1.0)
Monocytes Relative: 11 %
Neutro Abs: 2.6 K/uL (ref 1.7–7.7)
Neutrophils Relative %: 73 %
Platelets: 270 K/uL (ref 150–400)
RBC: 3.76 MIL/uL — ABNORMAL LOW (ref 3.87–5.11)
RDW: 14.9 % (ref 11.5–15.5)
WBC: 3.6 K/uL — ABNORMAL LOW (ref 4.0–10.5)
nRBC: 0 % (ref 0.0–0.2)

## 2019-06-27 LAB — COMPREHENSIVE METABOLIC PANEL WITH GFR
ALT: 88 U/L — ABNORMAL HIGH (ref 0–44)
AST: 39 U/L (ref 15–41)
Albumin: 4.1 g/dL (ref 3.5–5.0)
Alkaline Phosphatase: 72 U/L (ref 38–126)
Anion gap: 9 (ref 5–15)
BUN: 9 mg/dL (ref 6–20)
CO2: 25 mmol/L (ref 22–32)
Calcium: 9.4 mg/dL (ref 8.9–10.3)
Chloride: 106 mmol/L (ref 98–111)
Creatinine, Ser: 0.66 mg/dL (ref 0.44–1.00)
GFR calc Af Amer: >60 mL/min
GFR calc non Af Amer: >60 mL/min
Glucose, Bld: 110 mg/dL — ABNORMAL HIGH (ref 70–99)
Potassium: 4.4 mmol/L (ref 3.5–5.1)
Sodium: 140 mmol/L (ref 135–145)
Total Bilirubin: 0.3 mg/dL (ref 0.3–1.2)
Total Protein: 6.8 g/dL (ref 6.5–8.1)

## 2019-06-27 MED ORDER — SODIUM CHLORIDE 0.9 % IV SOLN
Freq: Once | INTRAVENOUS | Status: AC
  Administered 2019-06-27: 12:00:00 via INTRAVENOUS
  Filled 2019-06-27: qty 250

## 2019-06-27 MED ORDER — FAMOTIDINE IN NACL 20-0.9 MG/50ML-% IV SOLN
20.0000 mg | Freq: Once | INTRAVENOUS | Status: AC
  Administered 2019-06-27: 12:00:00 20 mg via INTRAVENOUS

## 2019-06-27 MED ORDER — PALONOSETRON HCL INJECTION 0.25 MG/5ML
0.2500 mg | Freq: Once | INTRAVENOUS | Status: AC
  Administered 2019-06-27: 12:00:00 0.25 mg via INTRAVENOUS

## 2019-06-27 MED ORDER — SODIUM CHLORIDE 0.9% FLUSH
10.0000 mL | Freq: Once | INTRAVENOUS | Status: AC | PRN
  Administered 2019-06-27: 10 mL
  Filled 2019-06-27: qty 10

## 2019-06-27 MED ORDER — DEXAMETHASONE SODIUM PHOSPHATE 10 MG/ML IJ SOLN
INTRAMUSCULAR | Status: AC
  Filled 2019-06-27: qty 1

## 2019-06-27 MED ORDER — PALONOSETRON HCL INJECTION 0.25 MG/5ML
INTRAVENOUS | Status: AC
  Filled 2019-06-27: qty 5

## 2019-06-27 MED ORDER — DIPHENHYDRAMINE HCL 50 MG/ML IJ SOLN
25.0000 mg | Freq: Once | INTRAMUSCULAR | Status: AC
  Administered 2019-06-27: 25 mg via INTRAVENOUS

## 2019-06-27 MED ORDER — DIPHENHYDRAMINE HCL 50 MG/ML IJ SOLN
INTRAMUSCULAR | Status: AC
  Filled 2019-06-27: qty 1

## 2019-06-27 MED ORDER — HEPARIN SOD (PORK) LOCK FLUSH 100 UNIT/ML IV SOLN
500.0000 [IU] | Freq: Once | INTRAVENOUS | Status: AC | PRN
  Administered 2019-06-27: 500 [IU]
  Filled 2019-06-27: qty 5

## 2019-06-27 MED ORDER — GABAPENTIN 300 MG PO CAPS: 300.0000 mg | ORAL_CAPSULE | Freq: Every day | ORAL | 4 refills | Status: DC

## 2019-06-27 MED ORDER — DEXAMETHASONE SODIUM PHOSPHATE 10 MG/ML IJ SOLN
4.0000 mg | Freq: Once | INTRAMUSCULAR | Status: AC
  Administered 2019-06-27: 12:00:00 4 mg via INTRAVENOUS

## 2019-06-27 MED ORDER — FAMOTIDINE IN NACL 20-0.9 MG/50ML-% IV SOLN
INTRAVENOUS | Status: AC
  Filled 2019-06-27: qty 50

## 2019-06-27 MED ORDER — SODIUM CHLORIDE 0.9 % IV SOLN
60.0000 mg/m2 | Freq: Once | INTRAVENOUS | Status: AC
  Administered 2019-06-27: 102 mg via INTRAVENOUS
  Filled 2019-06-27: qty 17

## 2019-06-27 MED ORDER — SODIUM CHLORIDE 0.9% FLUSH
10.0000 mL | INTRAVENOUS | Status: DC | PRN
  Administered 2019-06-27: 14:00:00 10 mL
  Filled 2019-06-27: qty 10

## 2019-06-27 NOTE — Progress Notes (Signed)
Patient had a pimple like spot under incision line, instructed her to use neosporin if need be and to also watch and make sure it didn't start to weep or get larger. Placed gauze over the site just to ensure it was irritated by dressing. Marland Kitchen

## 2019-06-27 NOTE — Patient Instructions (Signed)
[facility] Discharge Instructions for Patients Receiving Chemotherapy  Today you received the following chemotherapy agents:  Taxol.  To help prevent nausea and vomiting after your treatment, we encourage you to take your nausea medication as directed.   If you develop nausea and vomiting that is not controlled by your nausea medication, call the clinic.   BELOW ARE SYMPTOMS THAT SHOULD BE REPORTED IMMEDIATELY:  *FEVER GREATER THAN 100.5 F  *CHILLS WITH OR WITHOUT FEVER  NAUSEA AND VOMITING THAT IS NOT CONTROLLED WITH YOUR NAUSEA MEDICATION  *UNUSUAL SHORTNESS OF BREATH  *UNUSUAL BRUISING OR BLEEDING  TENDERNESS IN MOUTH AND THROAT WITH OR WITHOUT PRESENCE OF ULCERS  *URINARY PROBLEMS  *BOWEL PROBLEMS  UNUSUAL RASH Items with * indicate a potential emergency and should be followed up as soon as possible.  Feel free to call the clinic should you have any questions or concerns. The clinic phone number is [phone].  Please show the CHEMO ALERT CARD at check-in to the Emergency Department and triage nurse.   

## 2019-06-27 NOTE — Patient Instructions (Signed)

## 2019-07-03 ENCOUNTER — Encounter: Payer: Self-pay | Admitting: *Deleted

## 2019-07-03 NOTE — Progress Notes (Signed)
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   She completed four cycles of adjuvant Doxorubicin and Cyclophosphamide, and is now receive weekly Paclitaxel.  Today is week 8 of her treatment.    REVIEW OF SYSTEMS: Tonya Whitehead has been started on gabapentin for her hot flashes.  She is taking this at night and it is helping with her sleep.  She feels much better.  She denies peripheral neuropathy.  She has some mild tenderness at her left chest wall where her drains were removed.  Toshi denies any fever, chills, chest pain, palpitations, cough, shortness of breath, nausea, vomiting, headaches, bowel/bladder changes, or any other concerns.  A detailed ROS was otherwise non contributory.      HISTORY OF CURRENT ILLNESS: From the original intake note:  Sherice Minardi notes a history of reconstructive left breast surgery in 2002 while living in [place], [place]. That was for symmetry. The left breast accordingly had some scar tissue and she thought that was what she was noting until mid March, when she felt the left breast was changing more and the nipple seemed to be sinking subtly.  She brought this  to Dr Dahlia Bailiff attention and underwent bilateral diagnostic mammography with tomography and bilateral breast ultrasonography at [facility] on 12/01/2018 showing: breast density category C; findings concerning for extensive malignancy throughout the left breast predominately involving the upper- and lower-outer quadrants.  The mass was palpable within the upper outer left breast and there was left nipple retraction as well as indentation along the inferior margin of the left breast.  Targeted ultrasound showed three masses measuring 5.6 cm (2 o'clock position), 4.0 cm (close to the nipple and contiguous to the prior mass), and 3.3 cm (6 o'clock position.).  Also noted was a borderline left axillary lymph node adjacent to the left axillary artery (and so not easy to biopsy).  In the right breast was noted a 1.3 cm cyst.  On 12/08/2018 Willy proceeded to biopsy of 2 of the left breast areas in question. The pathology from this procedure ([id number]) showed: invasive mammary carcinoma, grade 2, in two cores (at 2 o'clock and 6 o'clock) with identical histomorphology; e-cadherin is negative, consistent with a lobular phenotype. Prognostic indicators significant for: estrogen receptor, 70% positive and progesterone receptor, 70% positive, both with strong staining intensity. Proliferation marker Ki67 at 3%. HER2 negative by immunohistochemistry (1+).  Note also the patient had a thyroid ultrasound 07/18/2018 showing bilateral solitary nodules, which were biopsied 08/09/2018.  Both showed follicular nodules, Bethesda category 2 (benign).  The patient's subsequent history is as detailed below.   PAST MEDICAL HISTORY: Past Medical History:  Diagnosis Date   Breast cancer ([place]) 12/08/2018   invasive lobular   Family history of bladder cancer    Family history of colon cancer  Family history of lung cancer    Family history of ovarian cancer     PAST SURGICAL HISTORY: Past Surgical History:    Procedure Laterality Date   ADENOIDECTOMY     as a child   BREAST EXCISIONAL BIOPSY Left 2002   reconstructive surgery on nipple to match right breast   IR IMAGING GUIDED PORT INSERTION  03/08/2019  Adenoidectomy as a child.   FAMILY HISTORY: Family History  Problem Relation Age of Onset   Lung cancer Mother 37       smoker   Ovarian cancer Mother 43   Colon cancer Maternal Aunt        34s   Bladder Cancer Paternal Grandfather        death was unrelated   Cancer Paternal Aunt        through [facility], blood cancer   As of May 2020 patient's father is alive at age 73. Patient's mother is also living at age 28. She has ovarian cancer, diagnosed in 42 2019, and lung cancer (stage IV), diagnosed in 2016. She tested negative for BRCA.  A maternal aunt had colon cancer in her 30s.  3 other siblings of the patient's mother have not had any cancer.  On the paternal side the paternal grandmother had bladder cancer.  A paternal great aunt died from cancer of the pancreas.     GYNECOLOGIC HISTORY:  No LMP recorded. Menarche: 43 years old Age at first live birth: 43 years old GX P 3 LMP every month, 5 total days with 2 heavy Contraceptive no, husband with vasectomy. HRT n/a  Hysterectomy? no BSO? 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 Husband [name] is a physical therapist. He is biologically [place] and was adopted by a [place] family.  The patient lives at home with her husband and their 3 children: [name] is 57, [name] is 76, and  [name], 44.  The patient attends a [employer].    ADVANCED DIRECTIVES: Husband [name] is her HCPOA   HEALTH MAINTENANCE: Social History   Tobacco Use   Smoking status: Never Smoker   Smokeless tobacco: Never Used  Substance Use Topics   Alcohol use: Not on file   Drug use: Never     Colonoscopy: never done  PAP: not on file  Bone density: never done   Allergies  Allergen  Reactions   Gluten Meal Hives    Joint pain, upset stomach   Sulfa Antibiotics     Unknown reaction " I was told I had a reaction when I was a baby "    Current Outpatient Medications  Medication Sig Dispense Refill   gabapentin (NEURONTIN) 300 MG capsule Take 1 capsule (300 mg total) by mouth at bedtime. 90 capsule 4   lidocaine-prilocaine (EMLA) cream Apply to affected area once 30 g 3   polyethylene glycol (MIRALAX / GLYCOLAX) 17 g packet Take 17 g by mouth daily as needed for mild constipation.     promethazine (PHENERGAN) 25 MG tablet Take 1 tablet (25 mg total) by mouth every 6 (six) hours as needed for nausea or vomiting. 30 tablet 1   levothyroxine (SYNTHROID) 25 MCG tablet Take 1 tablet (25 mcg total) by mouth daily before breakfast. (Patient not taking: Reported on 07/04/2019) 60 tablet 1   loratadine (CLARITIN) 10 MG tablet Take 10 mg by mouth daily.     LORazepam (ATIVAN) 0.5 MG tablet TAKE ONE TABLET BY MOUTH EVERY  6 HOURS AS NEEDED FOR NAUSEA (Patient not taking: Reported on 07/04/2019) 40 tablet 0   metoCLOPramide (REGLAN) 5 MG tablet Take 1 tablet (5 mg total) by mouth 4 (four) times daily -  before meals and at bedtime. (Patient not taking: Reported on 07/04/2019) 40 tablet 3   ondansetron (ZOFRAN-ODT) 4 MG disintegrating tablet      prochlorperazine (COMPAZINE) 10 MG tablet Take 1 tablet (10 mg total) by mouth every 6 (six) hours as needed (Nausea or vomiting). (Patient not taking: Reported on 07/04/2019) 30 tablet 1   PROMETHEGAN 25 MG suppository INSERT 1 SUPPOSITORY RECTALLY EVERY 6 HOURS AS NEEDED FOR NAUSEA OR VOMITING *USE IF ACTIVELY VOMITING* (Patient not taking: Reported on 07/04/2019) 12 suppository 0   No current facility-administered medications for this visit.     OBJECTIVE:   Vitals:   07/04/19 0908  BP: 120/70  Pulse: 86  Resp: 17  Temp: 98.5 F (36.9 C)  SpO2: 100%   Wt Readings from Last 3 Encounters:  07/04/19 143 lb 8 oz (65.1 kg)    06/27/19 144 lb 1.6 oz (65.4 kg)  06/13/19 140 lb (63.5 kg)   Body mass index is 24.63 kg/m.    ECOG FS:1 - Symptomatic but completely ambulatory GENERAL: Patient is a well appearing female in no acute distress HEENT:  Sclerae anicteric.  Oropharynx clear and moist. No ulcerations or evidence of oropharyngeal candidiasis. Neck is supple.  NODES:  No cervical, supraclavicular, or axillary lymphadenopathy palpated.  BREAST EXAM:  Left breast s/p mastectomy.  No sign of local recurrence, mild tenderness to palpation at chest wall drain removal sites LUNGS:  Clear to auscultation bilaterally.  No wheezes or rhonchi. HEART:  Regular rate and rhythm. No murmur appreciated. ABDOMEN:  Soft, nontender.  Positive, normoactive bowel sounds. No organomegaly palpated. MSK:  No focal spinal tenderness to palpation. Full range of motion bilaterally in the upper extremities. EXTREMITIES:  No peripheral edema.   SKIN:  Clear with no obvious rashes or skin changes. No nail dyscrasia. NEURO:  Nonfocal. Well oriented.  Appropriate affect.    LAB RESULTS:  CMP     Component Value Date/Time   NA 139 07/04/2019 0900   K 4.3 07/04/2019 0900   CL 104 07/04/2019 0900   CO2 27 07/04/2019 0900   GLUCOSE 112 (H) 07/04/2019 0900   BUN 14 07/04/2019 0900   CREATININE 0.70 07/04/2019 0900   CREATININE 0.85 03/20/2019 1022   CALCIUM 9.2 07/04/2019 0900   PROT 6.9 07/04/2019 0900   ALBUMIN 4.1 07/04/2019 0900   AST 35 07/04/2019 0900   AST 13 (L) 03/20/2019 1022   ALT 89 (H) 07/04/2019 0900   ALT 40 03/20/2019 1022   ALKPHOS 70 07/04/2019 0900   BILITOT 0.4 07/04/2019 0900   BILITOT 0.7 03/20/2019 1022   GFRNONAA >60 07/04/2019 0900   GFRNONAA >60 03/20/2019 1022   GFRAA >60 07/04/2019 0900   GFRAA >60 03/20/2019 1022    No results found for: TOTALPROTELP, ALBUMINELP, A1GS, A2GS, BETS, BETA2SER, GAMS, MSPIKE, SPEI  No results found for: KPAFRELGTCHN, LAMBDASER, KAPLAMBRATIO  Lab Results   Component Value Date   WBC 4.0 07/04/2019   NEUTROABS 3.0 07/04/2019   HGB 12.4 07/04/2019   HCT 37.9 07/04/2019   MCV 96.4 07/04/2019   PLT 250 07/04/2019    No results found for: LABCA2  No components found for: [id number]  No results for input(s): INR in the last 168 hours.  No results found for: LABCA2  No  results found for: CAN199  No results found for: KKX381  No results found for: WEX937  No results found for: CA2729  No components found for: HGQUANT  No results found for: CEA1 / No results found for: CEA1   No results found for: AFPTUMOR  No results found for: [place]  No results found for: PSA1  Appointment on 07/04/2019  Component Date Value Ref Range Status   Sodium 07/04/2019 139  135 - 145 mmol/L Final   Potassium 07/04/2019 4.3  3.5 - 5.1 mmol/L Final   Chloride 07/04/2019 104  98 - 111 mmol/L Final   CO2 07/04/2019 27  22 - 32 mmol/L Final   Glucose, Bld 07/04/2019 112* 70 - 99 mg/dL Final   BUN 07/04/2019 14  6 - 20 mg/dL Final   Creatinine, Ser 07/04/2019 0.70  0.44 - 1.00 mg/dL Final   Calcium 07/04/2019 9.2  8.9 - 10.3 mg/dL Final   Total Protein 07/04/2019 6.9  6.5 - 8.1 g/dL Final   Albumin 07/04/2019 4.1  3.5 - 5.0 g/dL Final   AST 07/04/2019 35  15 - 41 U/L Final   ALT 07/04/2019 89* 0 - 44 U/L Final   Alkaline Phosphatase 07/04/2019 70  38 - 126 U/L Final   Total Bilirubin 07/04/2019 0.4  0.3 - 1.2 mg/dL Final   GFR calc non Af Amer 07/04/2019 >60  >60 mL/min Final   GFR calc Af Amer 07/04/2019 >60  >60 mL/min Final   Anion gap 07/04/2019 8  5 - 15 Final   Performed at [facility], [place] [address] [place], [place] [address]   WBC 07/04/2019 4.0  4.0 - 10.5 K/uL Final   RBC 07/04/2019 3.93  3.87 - 5.11 MIL/uL Final   Hemoglobin 07/04/2019 12.4  12.0 - 15.0 g/dL Final   HCT 07/04/2019 37.9  36.0 - 46.0 % Final   MCV 07/04/2019 96.4  80.0 - 100.0 fL Final   MCH 07/04/2019 31.6  26.0 -  34.0 pg Final   MCHC 07/04/2019 32.7  30.0 - 36.0 g/dL Final   RDW 07/04/2019 14.6  11.5 - 15.5 % Final   Platelets 07/04/2019 250  150 - 400 K/uL Final   nRBC 07/04/2019 0.0  0.0 - 0.2 % Final   Neutrophils Relative % 07/04/2019 75  % Final   Neutro Abs 07/04/2019 3.0  1.7 - 7.7 K/uL Final   Lymphocytes Relative 07/04/2019 12  % Final   Lymphs Abs 07/04/2019 0.5* 0.7 - 4.0 K/uL Final   Monocytes Relative 07/04/2019 9  % Final   Monocytes Absolute 07/04/2019 0.4  0.1 - 1.0 K/uL Final   Eosinophils Relative 07/04/2019 1  % Final   Eosinophils Absolute 07/04/2019 0.1  0.0 - 0.5 K/uL Final   Basophils Relative 07/04/2019 2  % Final   Basophils Absolute 07/04/2019 0.1  0.0 - 0.1 K/uL Final   Immature Granulocytes 07/04/2019 1  % Final   Abs Immature Granulocytes 07/04/2019 0.04  0.00 - 0.07 K/uL Final   Performed at [facility], [place] [address], [place], [place] [address]    (this displays the last labs from the last 3 days)  No results found for: TOTALPROTELP, ALBUMINELP, A1GS, A2GS, BETS, BETA2SER, GAMS, MSPIKE, SPEI (this displays SPEP labs)  No results found for: KPAFRELGTCHN, LAMBDASER, KAPLAMBRATIO (kappa/lambda light chains)  No results found for: HGBA, HGBA2QUANT, HGBFQUANT, HGBSQUAN (Hemoglobinopathy evaluation)   No results found for: LDH  No results found for: IRON, TIBC,  IRONPCTSAT (Iron and TIBC)  No results found for: FERRITIN  Urinalysis    Component Value Date/Time   COLORURINE STRAW (A) 05/09/2019 1234   APPEARANCEUR CLEAR 05/09/2019 1234   LABSPEC 1.003 (L) 05/09/2019 1234   PHURINE 6.0 05/09/2019 1234   GLUCOSEU NEGATIVE 05/09/2019 1234   HGBUR NEGATIVE 05/09/2019 [place] 05/09/2019 [place] 05/09/2019 1234   PROTEINUR NEGATIVE 05/09/2019 1234   NITRITE NEGATIVE 05/09/2019 1234   LEUKOCYTESUR NEGATIVE 05/09/2019 1234     STUDIES: No results found.   ELIGIBLE FOR  AVAILABLE RESEARCH PROTOCOL: no  ASSESSMENT: 43 y.o. [place] woman status post left breast biopsy x2 on 12/08/2018, for multicentric invasive lobular breast cancer, grade 2, estrogen and progesterone receptor positive, HER-2 nonamplified, with an MIB-1 of 3%  (a) bilateral breast MRI 12/20/2018 shows multiple masses throughout the left breast, with nipple retraction, but no evidence of chest wall invasion, no abnormal left axillary lymph nodes, and no findings of concern in the right breast  (b) chest CT scan and bone scan 12/29/2018 show only an incidental goiter  (1) status post left skin sparing mastectomy at [facility] 01/16/2019 for an mpT3 pN1, stage IIA invasive lobular carcinoma, grade 2; repeat prognostic panel again estrogen and progesterone receptor strongly positive, HER-2 nonamplified.  (a) a total of 8 lymph nodes removed, one with macrometastasis (8 mm, no ECE)  (2) adjuvant chemotherapy will consist of doxorubicin and cyclophosphamide in dose dense fashion x4 starting 03/13/2019, to be followed by weekly paclitaxel x12  (a) echo 03/09/2019 finds normal left ventricular function at 55-60%  (b) cycle 2 doxorubicin and cyclophosphamide delayed 1 week secondary to nausea problems  (3) adjuvant radiation to follow  (4) antiestrogens to start at the completion of local treatment  (5) genetics testing 01/09/2019 through the [facility] STAT Breast Cancer Panel + Common Hereditary Cancers Panel found no deleterious mutations in ATM, BRCA1, BRCA2, CDH1, CHEK2, PALB2, PTEN, STK11 and TP53, APC, ATM, AXIN2, BARD1, BMPR1A, BRCA1, BRCA2, BRIP1, CDH1, CDKN2A (p14ARF), CDKN2A (p16INK4a), CKD4, CHEK2, CTNNA1, DICER1, EPCAM (Deletion/duplication testing only), GREM1 (promoter region deletion/duplication testing only), KIT, MEN1, MLH1, MSH2, MSH3, MSH6, MUTYH, NBN, NF1, NHTL1, PALB2, PDGFRA, PMS2, POLD1, POLE, PTEN, RAD50, RAD51C, RAD51D SDHB, SDHC, SDHD, SMAD4, SMARCA4. STK11, TP53, TSC1, TSC2, and VHL.   The following genes were evaluated for sequence changes only: SDHA and HOXB13 c.251G>A variant only.   (6) vasomotor symptoms  PLAN: Amand is doing quite well today.  Her labs are stable, and she will proceed with her weekly paclitaxel with her ALT of 89.  This is much improved with her dose reduction.  We are monitoring her closely for peripheral neuropathy which she hasn't developed.  She is taking gabapentin for hot flashes and these are much improved and she is also sleeping better.  She and I talked about mental health and transition of care points during the cancer journey.  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  I talked to her about our Justin is going to get Kyndahl set up with radiation oncology to discuss treatment, so that she can go ahead and be thinking about her schedule in the new year.    We will see Gladis back in 1 week for labs, f/u, and her next chemotherapy.  She was recommended to continue with the appropriate pandemic precautions. She knows to call for any questions that may arise between now  and her next appointment.  We are happy to see her sooner if needed.  A total of (20) minutes of face-to-face time was spent with this patient with greater than 50% of that time in counseling and care-coordination.    Wilber Bihari, NP  07/04/19 9:41 AM Medical Oncology and Hematology [facility] [place], [place] [address] Tel. [phone]    Fax. [phone]

## 2019-07-04 ENCOUNTER — Other Ambulatory Visit: Payer: Self-pay

## 2019-07-04 ENCOUNTER — Other Ambulatory Visit: Payer: Self-pay | Admitting: *Deleted

## 2019-07-04 ENCOUNTER — Inpatient Hospital Stay: Payer: 59

## 2019-07-04 ENCOUNTER — Inpatient Hospital Stay (HOSPITAL_BASED_OUTPATIENT_CLINIC_OR_DEPARTMENT_OTHER): Payer: 59 | Admitting: Adult Health

## 2019-07-04 ENCOUNTER — Encounter: Payer: Self-pay | Admitting: Adult Health

## 2019-07-04 VITALS — BP 120/70 | HR 86 | Temp 98.5°F | Resp 17 | Ht 64.0 in | Wt 143.5 lb

## 2019-07-04 DIAGNOSIS — C50812 Malignant neoplasm of overlapping sites of left female breast: Secondary | ICD-10-CM

## 2019-07-04 DIAGNOSIS — Z170 Estrogen receptor positive status [ER+]: Secondary | ICD-10-CM

## 2019-07-04 DIAGNOSIS — Z5111 Encounter for antineoplastic chemotherapy: Secondary | ICD-10-CM | POA: Diagnosis not present

## 2019-07-04 LAB — COMPREHENSIVE METABOLIC PANEL
ALT: 89 U/L — ABNORMAL HIGH (ref 0–44)
AST: 35 U/L (ref 15–41)
Albumin: 4.1 g/dL (ref 3.5–5.0)
Alkaline Phosphatase: 70 U/L (ref 38–126)
Anion gap: 8 (ref 5–15)
BUN: 14 mg/dL (ref 6–20)
CO2: 27 mmol/L (ref 22–32)
Calcium: 9.2 mg/dL (ref 8.9–10.3)
Chloride: 104 mmol/L (ref 98–111)
Creatinine, Ser: 0.7 mg/dL (ref 0.44–1.00)
GFR calc Af Amer: >60 mL/min (ref >60)
GFR calc non Af Amer: >60 mL/min (ref >60)
Glucose, Bld: 112 mg/dL — ABNORMAL HIGH (ref 70–99)
Potassium: 4.3 mmol/L (ref 3.5–5.1)
Sodium: 139 mmol/L (ref 135–145)
Total Bilirubin: 0.4 mg/dL (ref 0.3–1.2)
Total Protein: 6.9 g/dL (ref 6.5–8.1)

## 2019-07-04 LAB — CBC WITH DIFFERENTIAL/PLATELET
Abs Immature Granulocytes: 0.04 10*3/uL (ref 0.00–0.07)
Basophils Absolute: 0.1 10*3/uL (ref 0.0–0.1)
Basophils Relative: 2 %
Eosinophils Absolute: 0.1 10*3/uL (ref 0.0–0.5)
Eosinophils Relative: 1 %
HCT: 37.9 % (ref 36.0–46.0)
Hemoglobin: 12.4 g/dL (ref 12.0–15.0)
Immature Granulocytes: 1 %
Lymphocytes Relative: 12 %
Lymphs Abs: 0.5 10*3/uL — ABNORMAL LOW (ref 0.7–4.0)
MCH: 31.6 pg (ref 26.0–34.0)
MCHC: 32.7 g/dL (ref 30.0–36.0)
MCV: 96.4 fL (ref 80.0–100.0)
Monocytes Absolute: 0.4 10*3/uL (ref 0.1–1.0)
Monocytes Relative: 9 %
Neutro Abs: 3 10*3/uL (ref 1.7–7.7)
Neutrophils Relative %: 75 %
Platelets: 250 10*3/uL (ref 150–400)
RBC: 3.93 MIL/uL (ref 3.87–5.11)
RDW: 14.6 % (ref 11.5–15.5)
WBC: 4 10*3/uL (ref 4.0–10.5)
nRBC: 0 % (ref 0.0–0.2)

## 2019-07-04 MED ORDER — HEPARIN SOD (PORK) LOCK FLUSH 100 UNIT/ML IV SOLN
500.0000 [IU] | Freq: Once | INTRAVENOUS | Status: AC | PRN
  Administered 2019-07-04: 12:00:00 500 [IU]
  Filled 2019-07-04: qty 5

## 2019-07-04 MED ORDER — DIPHENHYDRAMINE HCL 50 MG/ML IJ SOLN
25.0000 mg | Freq: Once | INTRAMUSCULAR | Status: AC
  Administered 2019-07-04: 10:00:00 25 mg via INTRAVENOUS

## 2019-07-04 MED ORDER — PALONOSETRON HCL INJECTION 0.25 MG/5ML
INTRAVENOUS | Status: AC
  Filled 2019-07-04: qty 5

## 2019-07-04 MED ORDER — SODIUM CHLORIDE 0.9 % IV SOLN
Freq: Once | INTRAVENOUS | Status: AC
  Administered 2019-07-04: 10:00:00 via INTRAVENOUS
  Filled 2019-07-04: qty 250

## 2019-07-04 MED ORDER — DEXAMETHASONE SODIUM PHOSPHATE 10 MG/ML IJ SOLN
4.0000 mg | Freq: Once | INTRAMUSCULAR | Status: AC
  Administered 2019-07-04: 10:00:00 4 mg via INTRAVENOUS

## 2019-07-04 MED ORDER — PALONOSETRON HCL INJECTION 0.25 MG/5ML
0.2500 mg | Freq: Once | INTRAVENOUS | Status: AC
  Administered 2019-07-04: 0.25 mg via INTRAVENOUS

## 2019-07-04 MED ORDER — FAMOTIDINE IN NACL 20-0.9 MG/50ML-% IV SOLN
INTRAVENOUS | Status: AC
  Filled 2019-07-04: qty 50

## 2019-07-04 MED ORDER — SODIUM CHLORIDE 0.9% FLUSH
10.0000 mL | Freq: Once | INTRAVENOUS | Status: AC | PRN
  Administered 2019-07-04: 09:00:00 10 mL
  Filled 2019-07-04: qty 10

## 2019-07-04 MED ORDER — DIPHENHYDRAMINE HCL 50 MG/ML IJ SOLN
INTRAMUSCULAR | Status: AC
  Filled 2019-07-04: qty 1

## 2019-07-04 MED ORDER — SODIUM CHLORIDE 0.9 % IV SOLN
60.0000 mg/m2 | Freq: Once | INTRAVENOUS | Status: AC
  Administered 2019-07-04: 102 mg via INTRAVENOUS
  Filled 2019-07-04: qty 17

## 2019-07-04 MED ORDER — SODIUM CHLORIDE 0.9% FLUSH
10.0000 mL | INTRAVENOUS | Status: DC | PRN
  Administered 2019-07-04: 10 mL
  Filled 2019-07-04: qty 10

## 2019-07-04 MED ORDER — DEXAMETHASONE SODIUM PHOSPHATE 10 MG/ML IJ SOLN
INTRAMUSCULAR | Status: AC
  Filled 2019-07-04: qty 1

## 2019-07-04 MED ORDER — FAMOTIDINE IN NACL 20-0.9 MG/50ML-% IV SOLN
20.0000 mg | Freq: Once | INTRAVENOUS | Status: AC
  Administered 2019-07-04: 10:00:00 20 mg via INTRAVENOUS

## 2019-07-04 NOTE — Patient Instructions (Signed)
[facility] Discharge Instructions for Patients Receiving Chemotherapy  Today you received the following chemotherapy agents:  Taxol.  To help prevent nausea and vomiting after your treatment, we encourage you to take your nausea medication as directed.   If you develop nausea and vomiting that is not controlled by your nausea medication, call the clinic.   BELOW ARE SYMPTOMS THAT SHOULD BE REPORTED IMMEDIATELY:  *FEVER GREATER THAN 100.5 F  *CHILLS WITH OR WITHOUT FEVER  NAUSEA AND VOMITING THAT IS NOT CONTROLLED WITH YOUR NAUSEA MEDICATION  *UNUSUAL SHORTNESS OF BREATH  *UNUSUAL BRUISING OR BLEEDING  TENDERNESS IN MOUTH AND THROAT WITH OR WITHOUT PRESENCE OF ULCERS  *URINARY PROBLEMS  *BOWEL PROBLEMS  UNUSUAL RASH Items with * indicate a potential emergency and should be followed up as soon as possible.  Feel free to call the clinic should you have any questions or concerns. The clinic phone number is [phone].  Please show the CHEMO ALERT CARD at check-in to the Emergency Department and triage nurse.   

## 2019-07-10 ENCOUNTER — Ambulatory Visit
Admission: RE | Admit: 2019-07-10 | Discharge: 2019-07-10 | Disposition: A | Discharge status: Home / Self Care | Payer: 59 | Source: Ambulatory Visit | Attending: Adult Health | Admitting: Adult Health

## 2019-07-10 ENCOUNTER — Ambulatory Visit
Admission: RE | Admit: 2019-07-10 | Discharge: 2019-07-10 | Disposition: A | Discharge status: Home / Self Care | Payer: 59 | Source: Ambulatory Visit | Attending: Radiation Oncology | Admitting: Radiation Oncology

## 2019-07-10 ENCOUNTER — Encounter: Payer: Self-pay | Admitting: Radiation Oncology

## 2019-07-10 DIAGNOSIS — Z170 Estrogen receptor positive status [ER+]: Secondary | ICD-10-CM

## 2019-07-10 DIAGNOSIS — C50812 Malignant neoplasm of overlapping sites of left female breast: Secondary | ICD-10-CM

## 2019-07-10 DIAGNOSIS — C50412 Malignant neoplasm of upper-outer quadrant of left female breast: Secondary | ICD-10-CM

## 2019-07-10 NOTE — Progress Notes (Signed)
Radiation Oncology         [phone] ________________________________  Initial Outpatient Consultation - Conducted via telephone due to current COVID-19 concerns for limiting patient exposure  I spoke with the patient to conduct this consult visit via telephone to spare the patient unnecessary potential exposure in the healthcare setting during the current COVID-19 pandemic. The patient was notified in advance and was offered a [place] meeting to allow for face to face communication but unfortunately reported that they did not have the appropriate resources/technology to support such a visit and instead preferred to proceed with a telephone consult.  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   HISTORY OF PRESENT ILLNESS: Tonya Whitehead is a 43 y.o. female seen for a  diagnosis of left breast cancer. The patient was noted to have a palpable abnormality in the left breast in the spring of 2020 which prompted diagnostic imaging.  She was found to have multifocal disease within the left breast with the largest area being 4.6 x 2.9 x 5.6 cm in the 2 o'clock position.  She had borderline adenopathy, and underwent biopsy on 12/08/2018 which revealed an invasive lobular carcinoma in the 2:00 and 6:00 positions both were grade 2 with associated LCIS that was graded as intermediate.  Her tumor was ER/PR positive HER-2 was also negative and her Ki-67 was 3%.  She proceeded with an MRI scan on 12/20/2018 and this revealed extensive left breast carcinoma within all 4 quadrants and no evidence of adenopathy in the left axilla.  No evidence of malignant process in the right breast was noted  either.  She did have some staging imaging that was negative for metastatic disease.  She did however have a 4.1 cm left thyroid nodule.  Her bone scan on 12/29/2018 was also negative for metastatic disease.  She proceeded with mastectomy and [facility] at [facility] on 01/16/2019 and this revealed multifocal lobular carcinoma the largest of which measured 7 cm with negative resection margins direct invasion of the nipple dermis was noted, she had a total of 7 lymph nodes removed one contained metastatic disease. She is not planning on reconstruction, but may be interested in a prosthetic breast. She began adjuvant chemotherapy on 03/13/2019, she continues on Taxol and is scheduled to finish her chemotherapy on 08/01/2019. She is seen via my chart to discuss the rationale for adjuvant radiotherapy.    PREVIOUS RADIATION THERAPY: No   PAST MEDICAL HISTORY:  Past Medical History:  Diagnosis Date  . Breast cancer ([place]) 12/08/2018   invasive lobular  . Family history of bladder cancer   . Family history of colon cancer   . Family history of lung cancer   . Family history of ovarian cancer        PAST SURGICAL HISTORY: Past Surgical History:  Procedure Laterality Date  . ADENOIDECTOMY     as a child  . BREAST EXCISIONAL BIOPSY Left 2002   reconstructive surgery on nipple to match right breast  . IR IMAGING GUIDED PORT INSERTION  03/08/2019     FAMILY HISTORY:  Family History  Problem Relation Age of Onset  . Lung cancer Mother 27  smoker  . Ovarian cancer Mother 61  . Colon cancer Maternal Aunt        14s  . Bladder Cancer Paternal Grandfather        death was unrelated  . Cancer Paternal Aunt        through [place], blood cancer     SOCIAL HISTORY:  reports that she has never smoked. She has never used smokeless tobacco. She reports that she does not use drugs.  The patient is married and resides in [place].  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    ALLERGIES: Gluten  meal and Sulfa antibiotics   MEDICATIONS:  Current Outpatient Medications  Medication Sig Dispense Refill  . gabapentin (NEURONTIN) 300 MG capsule Take 1 capsule (300 mg total) by mouth at bedtime. 90 capsule 4  . levothyroxine (SYNTHROID) 25 MCG tablet Take 1 tablet (25 mcg total) by mouth daily before breakfast. (Patient not taking: Reported on 07/04/2019) 60 tablet 1  . lidocaine-prilocaine (EMLA) cream Apply to affected area once 30 g 3  . loratadine (CLARITIN) 10 MG tablet Take 10 mg by mouth daily.    Marland Kitchen LORazepam (ATIVAN) 0.5 MG tablet TAKE ONE TABLET BY MOUTH EVERY 6 HOURS AS NEEDED FOR NAUSEA (Patient not taking: Reported on 07/04/2019) 40 tablet 0  . metoCLOPramide (REGLAN) 5 MG tablet Take 1 tablet (5 mg total) by mouth 4 (four) times daily -  before meals and at bedtime. (Patient not taking: Reported on 07/04/2019) 40 tablet 3  . ondansetron (ZOFRAN-ODT) 4 MG disintegrating tablet     . polyethylene glycol (MIRALAX / GLYCOLAX) 17 g packet Take 17 g by mouth daily as needed for mild constipation.    . prochlorperazine (COMPAZINE) 10 MG tablet Take 1 tablet (10 mg total) by mouth every 6 (six) hours as needed (Nausea or vomiting). (Patient not taking: Reported on 07/04/2019) 30 tablet 1  . promethazine (PHENERGAN) 25 MG tablet Take 1 tablet (25 mg total) by mouth every 6 (six) hours as needed for nausea or vomiting. 30 tablet 1  . PROMETHEGAN 25 MG suppository INSERT 1 SUPPOSITORY RECTALLY EVERY 6 HOURS AS NEEDED FOR NAUSEA OR VOMITING *USE IF ACTIVELY VOMITING* (Patient not taking: Reported on 07/04/2019) 12 suppository 0   No current facility-administered medications for this encounter.     REVIEW OF SYSTEMS: On review of systems, the patient reports that she is doing well overall. She has had some tiredness with treatment but has otherwise done pretty well. She is not having any neuropathy at this time. She denies any chest pain, shortness of breath, cough, fevers, chills, night sweats,  unintended weight changes. She denies any bowel or bladder disturbances, and denies abdominal pain, nausea or vomiting. She denies any new musculoskeletal or joint aches or pains. A complete review of systems is obtained and is otherwise negative.     PHYSICAL EXAM:  Unable to assess due to encounter type.   ECOG = 0  0 - Asymptomatic (Fully active, able to carry on all predisease activities without restriction)  1 - Symptomatic but completely ambulatory (Restricted in physically strenuous activity but ambulatory and able to carry out work of a light or sedentary nature. For example, light housework, office work)  2 - Symptomatic, <50% in bed during the day (Ambulatory and capable of all self care but unable to carry out any work activities. Up and about more than 50% of waking hours)  3 - Symptomatic, >50% in bed, but not bedbound (  Capable of only limited self-care, confined to bed or chair 50% or more of waking hours)  4 - Bedbound (Completely disabled. Cannot carry on any self-care. Totally confined to bed or chair)  5 - Death   Eustace Pen MM, Creech RH, Tormey DC, et al. [phone]). "Toxicity and response criteria of the [facility]". [place] Oncol. 5 (6): 649-55    LABORATORY DATA:  Lab Results  Component Value Date   WBC 4.0 07/04/2019   HGB 12.4 07/04/2019   HCT 37.9 07/04/2019   MCV 96.4 07/04/2019   PLT 250 07/04/2019   Lab Results  Component Value Date   NA 139 07/04/2019   K 4.3 07/04/2019   CL 104 07/04/2019   CO2 27 07/04/2019   Lab Results  Component Value Date   ALT 89 (H) 07/04/2019   AST 35 07/04/2019   ALKPHOS 70 07/04/2019   BILITOT 0.4 07/04/2019      RADIOGRAPHY: No results found.     IMPRESSION/PLAN: 1. 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 Dr. Lisbeth Renshaw discusses the pathology findings and reviews the nature of left breast disease.  Dr. Lisbeth Renshaw discusses the rationale for  radiotherapy and reviews her clinical situation and course.  The goals of radiotherapy would be with curative intent to reduce the risks of local recurrence.  He discusses that he would offer postmastectomy radiotherapy to include the regional lymph nodes in the left chest and thorax.  We discussed the risks, benefits, short, and long term effects of radiotherapy, and the patient is interested in proceeding. Dr. Lisbeth Renshaw discusses the delivery and logistics of radiotherapy and anticipates a course of 6 1/2 weeks of radiotherapy.  We will plan to proceed with simulation on August 22, 2019. We discussed that the timing of treatment to begin would be approximately 4 weeks after completing her systemic therapy.  2. Contraceptive counseling during radiotherapy. The patients contraception is in the form of her husband's vasectomy. She declines urine hcg for this reason.     Given current concerns for patient exposure during the COVID-19 pandemic, this encounter was conducted via telephone.  The patient has given verbal consent for this type of encounter. The time spent during this encounter was 60 minutes and 50% of that time was spent in the coordination of her care. The attendants for this meeting include Dr. Lisbeth Renshaw, Shona Simpson, [facility] and Janett Billow Span  During the encounter, Dr. Lisbeth Renshaw and  Shona Simpson [facility] were located at [facility] Radiation Oncology Department.  Tonya Whitehead  was located at home.    The above documentation reflects my direct findings during this shared patient visit. Please see the separate note by Dr. Lisbeth Renshaw on this date for the remainder of the patient's plan of care.    Carola Rhine, PAC

## 2019-07-10 NOTE — Addendum Note (Signed)
Encounter addended by: Kyung Rudd, MD on: 07/10/2019 10:50 AM  Actions taken: Visit diagnoses modified

## 2019-07-10 NOTE — Progress Notes (Signed)
Location of Breast Cancer: Left estrogen receptor positive (s/p LEFT mastectomy)  Histology per Pathology Report:  12/11/2018--Biopsy The pathology from this procedure ([id number]) showed: invasive mammary carcinoma, grade 2, in two cores (at 2 o'clock and 6 o'clock) with identical histomorphology; e-cadherin is negative, consistent with a lobular phenotype.   Receptor Status: ER(70%), PR (70%), Her2-neu (negative), Ki-67(3%)  Did patient present with symptoms (if so, please note symptoms) or was this found on screening mammography?: 12/01/2018  Bilateral diagnostic mammography with tomography and bilateral breast ultrasonography at [facility]  Past/Anticipated interventions by surgeon, if any:  01/16/2019  LEFT skin sparing mastectomy at [facility]; a total of 8 lymph nodes removed, one with macrometastasis  Plans to have a total thyroidectomy at [place] once she completes radiation  [had a thyroid ultrasound on 07/18/2018 showing bilateral solitary nodules, which were biopsied 08/09/2018.  Both showed follicular nodules, Bethesda category 2 (benign).]  Past/Anticipated interventions by medical oncology, if any: Chemotherapy  Adjuvant chemotherapy: 03/13/2019 4 cycles of dose dense Doxorubicin and Cyclophosphamide. Followed by weekly Paclitaxel for 12 cycles. Per patient she is scheduled to finish chemo on 08/01/2019  Lymphedema issues, if any: None  Pain issues, if any: None  SAFETY ISSUES:  Prior radiation? No  Pacemaker/ICD? No  Possible current pregnancy? No  Is the patient on methotrexate? No  Current Complaints / other details:  Patient currently has no issues or complaints. She has tolerated her chemotherapy treatments well with minimal side effects. She has started taking 376m Gabapentin at bedtime to help with hot flashes. She reported a small lump in-between where her drains were after her surgery and her left axilla, but she states that [employer] International[facility]amined the  area during her last visit and said it was nothing to be concerned with. Mother was diagnosed with stage IV lung cancer in 2016 and ovarian cancer in 2019 (she tested negative for BRCA). Overall she is doing well and taking all proper precautions during on going pandemic.     KZola Button RN 07/10/2019,8:25 AM

## 2019-07-10 NOTE — Patient Instructions (Signed)
What You Need to Know about Radiation Exposure from Procedures Radiation is a form of energy that can enter the cells in your body. Radiation is used in some common medical procedures. X-rays and gamma rays are some of the most common types of radiation used in imaging tests. These rays let health care providers create images of the organs and tissues inside the body to help them make a diagnosis. Radiation can also change cells and sometimes destroy them. Exposure to too much radiation can cause health conditions, such as cancer. Which procedures involve radiation? Imaging tests that use radiation include:  X-rays.  Mammograms.  Bone density scans.  CT scans.  PET scans.  Fluoroscopy. In addition to imaging tests, there is a type of cancer treatment that uses radiation to destroy cancer cells (radiation therapy). How much radiation is safe? No known level of radiation is considered completely safe. Experts think that exposure to low doses of radiation probably does not affect your health. Radiation exposure from most common medical procedures is low. Imaging tests are important tools to diagnose many treatable diseases and health conditions. Your health care provider will consider whether the benefits of the procedure are greater than the risks from radiation exposure. What are the risks of radiation exposure? Risks of radiation exposure depend on:  The test you have. Each test and each machine exposes you to a different amount of radiation.  Which area of your body is being tested. Some organs and tissues can be more sensitive to radiation than others. The more radiation you are exposed to, the greater the risk of cell damage and potential health problems. There is no set number of procedures involving radiation that is considered safe or unsafe. Exposure to extremely high levels of radiation, like in a nuclear accident, can eventually cause cancer. Monitor your skin for any changes  after a radiation exposure, and report any concerns to your health care provider. What are some questions to ask my health care provider? Talk with your health care provider about your other risk factors for cancer and how many previous procedures have exposed you to radiation. Discuss the benefits and risks of having additional procedures that expose you to radiation. If you are having a procedure that will expose you to radiation, ask your health care provider:  Do I need this procedure to diagnose my condition?  Could a procedure that does not involve radiation be used instead?  Is there a way to reduce my radiation exposure during the test? This information is not intended to replace advice given to you by your health care provider. Make sure you discuss any questions you have with your health care provider. Document Released: 07/27/2015 Document Revised: 07/29/2016 Document Reviewed: 07/27/2015 Elsevier Patient Education  2020 [employer].

## 2019-07-11 ENCOUNTER — Inpatient Hospital Stay: Payer: 59

## 2019-07-11 ENCOUNTER — Other Ambulatory Visit: Payer: Self-pay

## 2019-07-11 ENCOUNTER — Inpatient Hospital Stay (HOSPITAL_BASED_OUTPATIENT_CLINIC_OR_DEPARTMENT_OTHER): Payer: 59 | Admitting: Adult Health

## 2019-07-11 ENCOUNTER — Encounter: Payer: Self-pay | Admitting: Adult Health

## 2019-07-11 VITALS — BP 100/80 | HR 93 | Temp 98.4°F | Resp 17 | Ht 64.0 in | Wt 145.5 lb

## 2019-07-11 DIAGNOSIS — C50812 Malignant neoplasm of overlapping sites of left female breast: Secondary | ICD-10-CM | POA: Diagnosis not present

## 2019-07-11 DIAGNOSIS — Z170 Estrogen receptor positive status [ER+]: Secondary | ICD-10-CM

## 2019-07-11 DIAGNOSIS — Z5111 Encounter for antineoplastic chemotherapy: Secondary | ICD-10-CM | POA: Diagnosis not present

## 2019-07-11 LAB — CBC WITH DIFFERENTIAL/PLATELET
Abs Immature Granulocytes: 0.05 10*3/uL (ref 0.00–0.07)
Basophils Absolute: 0 10*3/uL (ref 0.0–0.1)
Basophils Relative: 1 %
Eosinophils Absolute: 0.1 10*3/uL (ref 0.0–0.5)
Eosinophils Relative: 2 %
HCT: 37.6 % (ref 36.0–46.0)
Hemoglobin: 12.3 g/dL (ref 12.0–15.0)
Immature Granulocytes: 1 %
Lymphocytes Relative: 12 %
Lymphs Abs: 0.5 10*3/uL — ABNORMAL LOW (ref 0.7–4.0)
MCH: 31.5 pg (ref 26.0–34.0)
MCHC: 32.7 g/dL (ref 30.0–36.0)
MCV: 96.4 fL (ref 80.0–100.0)
Monocytes Absolute: 0.4 10*3/uL (ref 0.1–1.0)
Monocytes Relative: 9 %
Neutro Abs: 3.1 10*3/uL (ref 1.7–7.7)
Neutrophils Relative %: 75 %
Platelets: 255 10*3/uL (ref 150–400)
RBC: 3.9 MIL/uL (ref 3.87–5.11)
RDW: 14.6 % (ref 11.5–15.5)
WBC: 4.2 10*3/uL (ref 4.0–10.5)
nRBC: 0 % (ref 0.0–0.2)

## 2019-07-11 LAB — COMPREHENSIVE METABOLIC PANEL
ALT: 85 U/L — ABNORMAL HIGH (ref 0–44)
AST: 40 U/L (ref 15–41)
Albumin: 4.2 g/dL (ref 3.5–5.0)
Alkaline Phosphatase: 66 U/L (ref 38–126)
Anion gap: 9 (ref 5–15)
BUN: 14 mg/dL (ref 6–20)
CO2: 25 mmol/L (ref 22–32)
Calcium: 9.3 mg/dL (ref 8.9–10.3)
Chloride: 106 mmol/L (ref 98–111)
Creatinine, Ser: 0.72 mg/dL (ref 0.44–1.00)
GFR calc Af Amer: >60 mL/min (ref >60)
GFR calc non Af Amer: >60 mL/min (ref >60)
Glucose, Bld: 109 mg/dL — ABNORMAL HIGH (ref 70–99)
Potassium: 4.6 mmol/L (ref 3.5–5.1)
Sodium: 140 mmol/L (ref 135–145)
Total Bilirubin: 0.3 mg/dL (ref 0.3–1.2)
Total Protein: 6.9 g/dL (ref 6.5–8.1)

## 2019-07-11 MED ORDER — HEPARIN SOD (PORK) LOCK FLUSH 100 UNIT/ML IV SOLN
500.0000 [IU] | Freq: Once | INTRAVENOUS | Status: AC | PRN
  Administered 2019-07-11: 13:00:00 500 [IU]
  Filled 2019-07-11: qty 5

## 2019-07-11 MED ORDER — PALONOSETRON HCL INJECTION 0.25 MG/5ML
0.2500 mg | Freq: Once | INTRAVENOUS | Status: AC
  Administered 2019-07-11: 11:00:00 0.25 mg via INTRAVENOUS

## 2019-07-11 MED ORDER — DEXAMETHASONE SODIUM PHOSPHATE 10 MG/ML IJ SOLN
4.0000 mg | Freq: Once | INTRAMUSCULAR | Status: AC
  Administered 2019-07-11: 11:00:00 4 mg via INTRAVENOUS

## 2019-07-11 MED ORDER — FAMOTIDINE IN NACL 20-0.9 MG/50ML-% IV SOLN
20.0000 mg | Freq: Once | INTRAVENOUS | Status: AC
  Administered 2019-07-11: 20 mg via INTRAVENOUS

## 2019-07-11 MED ORDER — SODIUM CHLORIDE 0.9 % IV SOLN
Freq: Once | INTRAVENOUS | Status: AC
  Filled 2019-07-11: qty 250

## 2019-07-11 MED ORDER — PALONOSETRON HCL INJECTION 0.25 MG/5ML
INTRAVENOUS | Status: AC
  Filled 2019-07-11: qty 5

## 2019-07-11 MED ORDER — DEXAMETHASONE SODIUM PHOSPHATE 10 MG/ML IJ SOLN
INTRAMUSCULAR | Status: AC
  Filled 2019-07-11: qty 1

## 2019-07-11 MED ORDER — FAMOTIDINE IN NACL 20-0.9 MG/50ML-% IV SOLN
INTRAVENOUS | Status: AC
  Filled 2019-07-11: qty 50

## 2019-07-11 MED ORDER — SODIUM CHLORIDE 0.9 % IV SOLN
60.0000 mg/m2 | Freq: Once | INTRAVENOUS | Status: AC
  Administered 2019-07-11: 102 mg via INTRAVENOUS
  Filled 2019-07-11: qty 17

## 2019-07-11 MED ORDER — DIPHENHYDRAMINE HCL 50 MG/ML IJ SOLN
25.0000 mg | Freq: Once | INTRAMUSCULAR | Status: AC
  Administered 2019-07-11: 25 mg via INTRAVENOUS

## 2019-07-11 MED ORDER — SODIUM CHLORIDE 0.9% FLUSH
10.0000 mL | INTRAVENOUS | Status: DC | PRN
  Administered 2019-07-11: 10 mL
  Filled 2019-07-11: qty 10

## 2019-07-11 MED ORDER — DIPHENHYDRAMINE HCL 50 MG/ML IJ SOLN
INTRAMUSCULAR | Status: AC
  Filled 2019-07-11: qty 1

## 2019-07-11 MED ORDER — SODIUM CHLORIDE 0.9% FLUSH
10.0000 mL | Freq: Once | INTRAVENOUS | Status: AC | PRN
  Administered 2019-07-11: 10 mL
  Filled 2019-07-11: qty 10

## 2019-07-11 NOTE — Progress Notes (Signed)
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   She completed four cycles of adjuvant Doxorubicin and Cyclophosphamide, and is now receive weekly Paclitaxel.  Today is week 9 of her treatment.    REVIEW OF SYSTEMS: Tonya Whitehead is fatigued.  She  Really wants to get her port removed as soon as possible when chemotherapy is completed.  This was placed in interventional radiology.  She has no peripheral neuropathy.  She notes her tongue is tender from eating hot soup.  She is having more mental fog.  She has mild nausea and drinks ginger tea and this helps it resolve.  She did have a skin reaction to a necklace earlier this week.    She wants to know where to go to get a breast prosthesis.  She is pretty sure that she has decided against breast reconstruction.  Tonya Whitehead denies any fever, chills, chest pain, palpitations, headaches, shortness of breath, bowel/bladder changes, headaches, vision issues, peripheral neuropathy or any other concerns.  A detailed ROS was otherwise non contributory.      HISTORY OF CURRENT ILLNESS: From the original intake note:  Tonya Whitehead notes a history of  reconstructive left breast surgery in 2002 while living in [place], [place]. That was for symmetry. The left breast accordingly had some scar tissue and she thought that was what she was noting until mid March, when she felt the left breast was changing more and the nipple seemed to be sinking subtly.  She brought this to Dr Dahlia Bailiff attention and underwent bilateral diagnostic mammography with tomography and bilateral breast ultrasonography at [facility] on 12/01/2018 showing: breast density category C; findings concerning for extensive malignancy throughout the left breast predominately involving the upper- and lower-outer quadrants.  The mass was palpable within the upper outer left breast and there was left nipple retraction as well as indentation along the inferior margin of the left breast.  Targeted ultrasound showed three masses measuring 5.6 cm (2 o'clock position), 4.0 cm (close to the nipple and contiguous to the prior mass), and 3.3 cm (6 o'clock position.).  Also noted was a borderline left axillary lymph node adjacent to the left axillary artery (and so not easy to biopsy).  In the right breast was noted a 1.3 cm cyst.  On 12/08/2018 Tonya Whitehead proceeded to biopsy of 2 of the left breast areas in question. The pathology from this procedure ([id number]) showed: invasive mammary carcinoma, grade 2, in two cores (at 2 o'clock and 6 o'clock) with identical histomorphology; e-cadherin is negative, consistent with a lobular phenotype. Prognostic indicators significant for: estrogen receptor, 70% positive and progesterone receptor, 70% positive, both with strong staining intensity. Proliferation marker Ki67 at 3%. HER2 negative by immunohistochemistry (1+).  Note also the patient had a thyroid ultrasound 07/18/2018 showing bilateral solitary nodules, which were  biopsied 08/09/2018.  Both showed follicular nodules, Bethesda category 2 (benign).  The patient's subsequent history is as detailed  below.   PAST MEDICAL HISTORY: Past Medical History:  Diagnosis Date  . Breast cancer ([place]) 12/08/2018   invasive lobular  . Family history of bladder cancer   . Family history of colon cancer   . Family history of lung cancer   . Family history of ovarian cancer     PAST SURGICAL HISTORY: Past Surgical History:  Procedure Laterality Date  . ADENOIDECTOMY     as a child  . BREAST EXCISIONAL BIOPSY Left 2002   reconstructive surgery on nipple to match right breast  . IR IMAGING GUIDED PORT INSERTION  03/08/2019  Adenoidectomy as a child.   FAMILY HISTORY: Family History  Problem Relation Age of Onset  . Lung cancer Mother 54       smoker  . Ovarian cancer Mother 67  . Colon cancer Maternal Aunt        75s  . Bladder Cancer Paternal Grandfather        death was unrelated  . Cancer Paternal Aunt        through [facility], blood cancer   As of May 2020 patient's father is alive at age 65. Patient's mother is also living at age 35. She has ovarian cancer, diagnosed in 2019, and lung cancer (stage IV), diagnosed in 2016. She tested negative for BRCA.  A maternal aunt had colon cancer in her 8s.  3 other siblings of the patient's mother have not had any cancer.  On the paternal side the paternal grandmother had bladder cancer.  A paternal great aunt died from cancer of the pancreas.     GYNECOLOGIC HISTORY:  No LMP recorded. Menarche: 43 years old Age at first live birth: 43 years old GX P 3 LMP every month, 5 total days with 2 heavy Contraceptive no, husband with vasectomy. HRT n/a  Hysterectomy? no BSO? 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 Husband [name] is a physical therapist. He is biologically [place] and was adopted by a [place] family.  The patient lives at home with her husband and their 3 children: [name] is 76, [name] is 17, and  [name], 110.  The patient attends a [employer].    ADVANCED DIRECTIVES:  Husband [name] is her HCPOA   HEALTH MAINTENANCE: Social History   Tobacco Use  . Smoking status: Never Smoker  . Smokeless tobacco: Never Used  Substance Use Topics  . Alcohol use: Not on file  . Drug use: Never     Colonoscopy: never done  PAP: not on file  Bone density: never done   Allergies  Allergen Reactions  . Gluten Meal Hives    Joint pain, upset stomach  . Sulfa Antibiotics     Unknown reaction " I was told I had a reaction when I was a baby "    Current Outpatient Medications  Medication Sig Dispense Refill  . gabapentin (NEURONTIN) 300 MG capsule Take 1 capsule (300 mg total) by mouth at bedtime. 90 capsule 4  . lidocaine-prilocaine (EMLA) cream Apply to affected area once 30 g 3  . loratadine (CLARITIN) 10 MG tablet Take 10 mg by mouth daily.    . ondansetron (ZOFRAN-ODT) 4 MG disintegrating tablet     . polyethylene glycol (MIRALAX / GLYCOLAX) 17 g packet Take 17 g by mouth daily as needed for mild constipation.    Marland Kitchen  promethazine (PHENERGAN) 25 MG tablet Take 1 tablet (25 mg total) by mouth every 6 (six) hours as needed for nausea or vomiting. 30 tablet 1  . levothyroxine (SYNTHROID) 25 MCG tablet Take 1 tablet (25 mcg total) by mouth daily before breakfast. (Patient not taking: Reported on 07/04/2019) 60 tablet 1  . LORazepam (ATIVAN) 0.5 MG tablet TAKE ONE TABLET BY MOUTH EVERY 6 HOURS AS NEEDED FOR NAUSEA (Patient not taking: Reported on 07/04/2019) 40 tablet 0  . metoCLOPramide (REGLAN) 5 MG tablet Take 1 tablet (5 mg total) by mouth 4 (four) times daily -  before meals and at bedtime. (Patient not taking: Reported on 07/04/2019) 40 tablet 3  . prochlorperazine (COMPAZINE) 10 MG tablet Take 1 tablet (10 mg total) by mouth every 6 (six) hours as needed (Nausea or vomiting). (Patient not taking: Reported on 07/04/2019) 30 tablet 1  . PROMETHEGAN 25 MG suppository INSERT 1 SUPPOSITORY RECTALLY EVERY 6 HOURS AS NEEDED FOR NAUSEA OR VOMITING *USE IF ACTIVELY VOMITING*  (Patient not taking: Reported on 07/04/2019) 12 suppository 0   No current facility-administered medications for this visit.    OBJECTIVE:   Vitals:   07/11/19 0947  BP: 100/80  Pulse: 93  Resp: 17  Temp: 98.4 F (36.9 C)  SpO2: 100%   Wt Readings from Last 3 Encounters:  07/11/19 145 lb 8 oz (66 kg)  07/04/19 143 lb 8 oz (65.1 kg)  06/27/19 144 lb 1.6 oz (65.4 kg)   Body mass index is 24.98 kg/m.    ECOG FS:1 - Symptomatic but completely ambulatory GENERAL: Patient is a well appearing female in no acute distress HEENT:  Sclerae anicteric. Mask in place. Neck is supple.  NODES:  No cervical, supraclavicular, or axillary lymphadenopathy palpated.  BREAST EXAM:  Deferred today LUNGS:  Clear to auscultation bilaterally.  No wheezes or rhonchi. HEART:  Regular rate and rhythm. No murmur appreciated. ABDOMEN:  Soft, nontender.  Positive, normoactive bowel sounds. No organomegaly palpated. MSK:  No focal spinal tenderness to palpation. Full range of motion bilaterally in the upper extremities. EXTREMITIES:  No peripheral edema.   SKIN:  Slight erythematous rash on right lateral neck (from jewelry reaction). No nail dyscrasia. NEURO:  Nonfocal. Well oriented.  Appropriate affect.    LAB RESULTS:  CMP     Component Value Date/Time   NA 140 07/11/2019 0935   K 4.6 07/11/2019 0935   CL 106 07/11/2019 0935   CO2 25 07/11/2019 0935   GLUCOSE 109 (H) 07/11/2019 0935   BUN 14 07/11/2019 0935   CREATININE 0.72 07/11/2019 0935   CREATININE 0.85 03/20/2019 1022   CALCIUM 9.3 07/11/2019 0935   PROT 6.9 07/11/2019 0935   ALBUMIN 4.2 07/11/2019 0935   AST 40 07/11/2019 0935   AST 13 (L) 03/20/2019 1022   ALT 85 (H) 07/11/2019 0935   ALT 40 03/20/2019 1022   ALKPHOS 66 07/11/2019 0935   BILITOT 0.3 07/11/2019 0935   BILITOT 0.7 03/20/2019 1022   GFRNONAA >60 07/11/2019 0935   GFRNONAA >60 03/20/2019 1022   GFRAA >60 07/11/2019 0935   GFRAA >60 03/20/2019 1022    No  results found for: TOTALPROTELP, ALBUMINELP, A1GS, A2GS, BETS, BETA2SER, GAMS, MSPIKE, SPEI  No results found for: KPAFRELGTCHN, LAMBDASER, KAPLAMBRATIO  Lab Results  Component Value Date   WBC 4.2 07/11/2019   NEUTROABS 3.1 07/11/2019   HGB 12.3 07/11/2019   HCT 37.6 07/11/2019   MCV 96.4 07/11/2019   PLT 255 07/11/2019  No results found for: LABCA2  No components found for: CHENID782  No results for input(s): INR in the last 168 hours.  No results found for: LABCA2  No results found for: UMP536  No results found for: RWE315  No results found for: QMG867  No results found for: CA2729  No components found for: HGQUANT  No results found for: CEA1 / No results found for: CEA1   No results found for: AFPTUMOR  No results found for: CHROMOGRNA  No results found for: PSA1  Appointment on 07/11/2019  Component Date Value Ref Range Status  . Sodium 07/11/2019 140  135 - 145 mmol/L Final  . Potassium 07/11/2019 4.6  3.5 - 5.1 mmol/L Final  . Chloride 07/11/2019 106  98 - 111 mmol/L Final  . CO2 07/11/2019 25  22 - 32 mmol/L Final  . Glucose, Bld 07/11/2019 109* 70 - 99 mg/dL Final  . BUN 07/11/2019 14  6 - 20 mg/dL Final  . Creatinine, Ser 07/11/2019 0.72  0.44 - 1.00 mg/dL Final  . Calcium 07/11/2019 9.3  8.9 - 10.3 mg/dL Final  . Total Protein 07/11/2019 6.9  6.5 - 8.1 g/dL Final  . Albumin 07/11/2019 4.2  3.5 - 5.0 g/dL Final  . AST 07/11/2019 40  15 - 41 U/L Final  . ALT 07/11/2019 85* 0 - 44 U/L Final  . Alkaline Phosphatase 07/11/2019 66  38 - 126 U/L Final  . Total Bilirubin 07/11/2019 0.3  0.3 - 1.2 mg/dL Final  . GFR calc non Af Amer 07/11/2019 >60  >60 mL/min Final  . GFR calc Af Amer 07/11/2019 >60  >60 mL/min Final  . Anion gap 07/11/2019 9  5 - 15 Final   Performed at [facility], [place] [address] [place], [place] [address]  . WBC 07/11/2019 4.2  4.0 - 10.5 K/uL Final  . RBC 07/11/2019 3.90  3.87 - 5.11 MIL/uL Final  .  Hemoglobin 07/11/2019 12.3  12.0 - 15.0 g/dL Final  . HCT 07/11/2019 37.6  36.0 - 46.0 % Final  . MCV 07/11/2019 96.4  80.0 - 100.0 fL Final  . MCH 07/11/2019 31.5  26.0 - 34.0 pg Final  . MCHC 07/11/2019 32.7  30.0 - 36.0 g/dL Final  . RDW 07/11/2019 14.6  11.5 - 15.5 % Final  . Platelets 07/11/2019 255  150 - 400 K/uL Final  . nRBC 07/11/2019 0.0  0.0 - 0.2 % Final  . Neutrophils Relative % 07/11/2019 75  % Final  . Neutro Abs 07/11/2019 3.1  1.7 - 7.7 K/uL Final  . Lymphocytes Relative 07/11/2019 12  % Final  . Lymphs Abs 07/11/2019 0.5* 0.7 - 4.0 K/uL Final  . Monocytes Relative 07/11/2019 9  % Final  . Monocytes Absolute 07/11/2019 0.4  0.1 - 1.0 K/uL Final  . Eosinophils Relative 07/11/2019 2  % Final  . Eosinophils Absolute 07/11/2019 0.1  0.0 - 0.5 K/uL Final  . Basophils Relative 07/11/2019 1  % Final  . Basophils Absolute 07/11/2019 0.0  0.0 - 0.1 K/uL Final  . Immature Granulocytes 07/11/2019 1  % Final  . Abs Immature Granulocytes 07/11/2019 0.05  0.00 - 0.07 K/uL Final   Performed at [facility], [place] [address] [place], [place] [address]    (this displays the last labs from the last 3 days)  No results found for: TOTALPROTELP, ALBUMINELP, A1GS, A2GS, BETS, BETA2SER, GAMS, MSPIKE, SPEI (this displays SPEP labs)  No results found for: KPAFRELGTCHN,  LAMBDASER, KAPLAMBRATIO (kappa/lambda light chains)  No results found for: HGBA, HGBA2QUANT, HGBFQUANT, HGBSQUAN (Hemoglobinopathy evaluation)   No results found for: LDH  No results found for: IRON, TIBC, IRONPCTSAT (Iron and TIBC)  No results found for: FERRITIN  Urinalysis    Component Value Date/Time   COLORURINE STRAW (A) 05/09/2019 1234   APPEARANCEUR CLEAR 05/09/2019 1234   LABSPEC 1.003 (L) 05/09/2019 1234   PHURINE 6.0 05/09/2019 [place] 05/09/2019 [place] 05/09/2019 [place] 05/09/2019 [place] 05/09/2019 1234    PROTEINUR NEGATIVE 05/09/2019 1234   NITRITE NEGATIVE 05/09/2019 1234   LEUKOCYTESUR NEGATIVE 05/09/2019 1234     STUDIES: No results found.   ELIGIBLE FOR AVAILABLE RESEARCH PROTOCOL: no  ASSESSMENT: 43 y.o. [place] woman status post left breast biopsy x2 on 12/08/2018, for multicentric invasive lobular breast cancer, grade 2, estrogen and progesterone receptor positive, HER-2 nonamplified, with an MIB-1 of 3%  (a) bilateral breast MRI 12/20/2018 shows multiple masses throughout the left breast, with nipple retraction, but no evidence of chest wall invasion, no abnormal left axillary lymph nodes, and no findings of concern in the right breast  (b) chest CT scan and bone scan 12/29/2018 show only an incidental goiter  (1) status post left skin sparing mastectomy at [facility] 01/16/2019 for an mpT3 pN1, stage IIA invasive lobular carcinoma, grade 2; repeat prognostic panel again estrogen and progesterone receptor strongly positive, HER-2 nonamplified.  (a) a total of 8 lymph nodes removed, one with macrometastasis (8 mm, no ECE)  (2) adjuvant chemotherapy will consist of doxorubicin and cyclophosphamide in dose dense fashion x4 starting 03/13/2019, to be followed by weekly paclitaxel x12  (a) echo 03/09/2019 finds normal left ventricular function at 55-60%  (b) cycle 2 doxorubicin and cyclophosphamide delayed 1 week secondary to nausea problems  (3) adjuvant radiation to follow  (4) antiestrogens to start at the completion of local treatment  (5) genetics testing 01/09/2019 through the [facility] STAT Breast Cancer Panel + Common Hereditary Cancers Panel found no deleterious mutations in ATM, BRCA1, BRCA2, CDH1, CHEK2, PALB2, PTEN, STK11 and TP53, APC, ATM, AXIN2, BARD1, BMPR1A, BRCA1, BRCA2, BRIP1, CDH1, CDKN2A (p14ARF), CDKN2A (p16INK4a), CKD4, CHEK2, CTNNA1, DICER1, EPCAM (Deletion/duplication testing only), GREM1 (promoter region deletion/duplication testing only), KIT, MEN1, MLH1, MSH2,  MSH3, MSH6, MUTYH, NBN, NF1, NHTL1, PALB2, PDGFRA, PMS2, POLD1, POLE, PTEN, RAD50, RAD51C, RAD51D SDHB, SDHC, SDHD, SMAD4, SMARCA4. STK11, TP53, TSC1, TSC2, and VHL.  The following genes were evaluated for sequence changes only: SDHA and HOXB13 c.251G>A variant only.   (6) vasomotor symptoms  PLAN: Lynleigh continues on adjuvant chemotherapy with weekly Paclitaxel.  She is tolerating this quite well.  Her labs are stable.  We have dose reduced her Paclitaxel due to LFT elevation.  Her LFTs have decreased and remain stable.  She has no peripheral neuropathy and we are monitoring her closely for this.  We reviewed her brain fog which is related to the chemotherapy and the stress her body is under.  I suggested yoga and meditation to help.    Her nausea is currently controlled with ginger tea and she isn't having to take any anti nausea medications.    Tonya Whitehead met with Dr. Lisbeth Renshaw and Bryson Ha in radiation oncology to discuss her adjuvant radiation therapy.  I placed orders for her port to be removed in IR about 10 days following her chemotherapy completion.    I gave Tonya Whitehead a prescription for breast prostheses and post  surgical bras.  I also gave her information on second to nature.    We will see Tonya Whitehead back in 1 week for labs, f/u, and her next chemotherapy.  She was recommended to continue with the appropriate pandemic precautions. She knows to call for any questions that may arise between now and her next appointment.  We are happy to see her sooner if needed.  A total of (25) minutes of face-to-face time was spent with this patient with greater than 50% of that time in counseling and care-coordination.    Wilber Bihari, NP  07/11/19 10:14 AM Medical Oncology and Hematology [facility] [place], [place] [address] Tel. [phone]    Fax. [phone]

## 2019-07-11 NOTE — Progress Notes (Signed)
Okay to treat today with ALT 85, per Wilber Bihari, NP.

## 2019-07-11 NOTE — Patient Instructions (Signed)
[facility] Discharge Instructions for Patients Receiving Chemotherapy  Today you received the following chemotherapy agents:  Taxol.  To help prevent nausea and vomiting after your treatment, we encourage you to take your nausea medication as directed.   If you develop nausea and vomiting that is not controlled by your nausea medication, call the clinic.   BELOW ARE SYMPTOMS THAT SHOULD BE REPORTED IMMEDIATELY:  *FEVER GREATER THAN 100.5 F  *CHILLS WITH OR WITHOUT FEVER  NAUSEA AND VOMITING THAT IS NOT CONTROLLED WITH YOUR NAUSEA MEDICATION  *UNUSUAL SHORTNESS OF BREATH  *UNUSUAL BRUISING OR BLEEDING  TENDERNESS IN MOUTH AND THROAT WITH OR WITHOUT PRESENCE OF ULCERS  *URINARY PROBLEMS  *BOWEL PROBLEMS  UNUSUAL RASH Items with * indicate a potential emergency and should be followed up as soon as possible.  Feel free to call the clinic should you have any questions or concerns. The clinic phone number is [phone].  Please show the CHEMO ALERT CARD at check-in to the Emergency Department and triage nurse.   

## 2019-07-18 ENCOUNTER — Inpatient Hospital Stay (HOSPITAL_BASED_OUTPATIENT_CLINIC_OR_DEPARTMENT_OTHER): Payer: 59 | Admitting: Adult Health

## 2019-07-18 ENCOUNTER — Inpatient Hospital Stay: Payer: 59

## 2019-07-18 ENCOUNTER — Encounter: Payer: Self-pay | Admitting: Adult Health

## 2019-07-18 ENCOUNTER — Other Ambulatory Visit: Payer: Self-pay

## 2019-07-18 VITALS — BP 103/67 | HR 97 | Temp 97.8°F | Resp 18 | Wt 144.8 lb

## 2019-07-18 DIAGNOSIS — Z170 Estrogen receptor positive status [ER+]: Secondary | ICD-10-CM

## 2019-07-18 DIAGNOSIS — C50812 Malignant neoplasm of overlapping sites of left female breast: Secondary | ICD-10-CM

## 2019-07-18 DIAGNOSIS — Z5111 Encounter for antineoplastic chemotherapy: Secondary | ICD-10-CM | POA: Diagnosis not present

## 2019-07-18 LAB — COMPREHENSIVE METABOLIC PANEL
ALT: 102 U/L — ABNORMAL HIGH (ref 0–44)
AST: 49 U/L — ABNORMAL HIGH (ref 15–41)
Albumin: 4 g/dL (ref 3.5–5.0)
Alkaline Phosphatase: 65 U/L (ref 38–126)
Anion gap: 9 (ref 5–15)
BUN: 10 mg/dL (ref 6–20)
CO2: 26 mmol/L (ref 22–32)
Calcium: 9 mg/dL (ref 8.9–10.3)
Chloride: 104 mmol/L (ref 98–111)
Creatinine, Ser: 0.65 mg/dL (ref 0.44–1.00)
GFR calc Af Amer: >60 mL/min (ref >60)
GFR calc non Af Amer: >60 mL/min (ref >60)
Glucose, Bld: 105 mg/dL — ABNORMAL HIGH (ref 70–99)
Potassium: 4.4 mmol/L (ref 3.5–5.1)
Sodium: 139 mmol/L (ref 135–145)
Total Bilirubin: 0.4 mg/dL (ref 0.3–1.2)
Total Protein: 6.6 g/dL (ref 6.5–8.1)

## 2019-07-18 LAB — CBC WITH DIFFERENTIAL/PLATELET
Abs Immature Granulocytes: 0.06 10*3/uL (ref 0.00–0.07)
Basophils Absolute: 0.1 10*3/uL (ref 0.0–0.1)
Basophils Relative: 1 %
Eosinophils Absolute: 0.1 10*3/uL (ref 0.0–0.5)
Eosinophils Relative: 1 %
HCT: 37.2 % (ref 36.0–46.0)
Hemoglobin: 12.2 g/dL (ref 12.0–15.0)
Immature Granulocytes: 1 %
Lymphocytes Relative: 10 %
Lymphs Abs: 0.4 10*3/uL — ABNORMAL LOW (ref 0.7–4.0)
MCH: 31.7 pg (ref 26.0–34.0)
MCHC: 32.8 g/dL (ref 30.0–36.0)
MCV: 96.6 fL (ref 80.0–100.0)
Monocytes Absolute: 0.4 10*3/uL (ref 0.1–1.0)
Monocytes Relative: 9 %
Neutro Abs: 3.2 10*3/uL (ref 1.7–7.7)
Neutrophils Relative %: 78 %
Platelets: 262 10*3/uL (ref 150–400)
RBC: 3.85 MIL/uL — ABNORMAL LOW (ref 3.87–5.11)
RDW: 14.7 % (ref 11.5–15.5)
WBC: 4.2 10*3/uL (ref 4.0–10.5)
nRBC: 0 % (ref 0.0–0.2)

## 2019-07-18 MED ORDER — PALONOSETRON HCL INJECTION 0.25 MG/5ML
0.2500 mg | Freq: Once | INTRAVENOUS | Status: AC
  Administered 2019-07-18: 0.25 mg via INTRAVENOUS

## 2019-07-18 MED ORDER — FAMOTIDINE IN NACL 20-0.9 MG/50ML-% IV SOLN
INTRAVENOUS | Status: AC
  Filled 2019-07-18: qty 50

## 2019-07-18 MED ORDER — HEPARIN SOD (PORK) LOCK FLUSH 100 UNIT/ML IV SOLN
500.0000 [IU] | Freq: Once | INTRAVENOUS | Status: AC | PRN
  Administered 2019-07-18: 500 [IU]
  Filled 2019-07-18: qty 5

## 2019-07-18 MED ORDER — SODIUM CHLORIDE 0.9 % IV SOLN
60.0000 mg/m2 | Freq: Once | INTRAVENOUS | Status: AC
  Administered 2019-07-18: 102 mg via INTRAVENOUS
  Filled 2019-07-18: qty 17

## 2019-07-18 MED ORDER — DEXAMETHASONE SODIUM PHOSPHATE 10 MG/ML IJ SOLN
INTRAMUSCULAR | Status: AC
  Filled 2019-07-18: qty 1

## 2019-07-18 MED ORDER — DIPHENHYDRAMINE HCL 50 MG/ML IJ SOLN
25.0000 mg | Freq: Once | INTRAMUSCULAR | Status: AC
  Administered 2019-07-18: 25 mg via INTRAVENOUS

## 2019-07-18 MED ORDER — DIPHENHYDRAMINE HCL 50 MG/ML IJ SOLN
INTRAMUSCULAR | Status: AC
  Filled 2019-07-18: qty 1

## 2019-07-18 MED ORDER — SODIUM CHLORIDE 0.9 % IV SOLN
Freq: Once | INTRAVENOUS | Status: AC
  Filled 2019-07-18: qty 250

## 2019-07-18 MED ORDER — SODIUM CHLORIDE 0.9% FLUSH
10.0000 mL | INTRAVENOUS | Status: DC | PRN
  Administered 2019-07-18: 10 mL
  Filled 2019-07-18: qty 10

## 2019-07-18 MED ORDER — FAMOTIDINE IN NACL 20-0.9 MG/50ML-% IV SOLN
20.0000 mg | Freq: Once | INTRAVENOUS | Status: AC
  Administered 2019-07-18: 20 mg via INTRAVENOUS

## 2019-07-18 MED ORDER — PALONOSETRON HCL INJECTION 0.25 MG/5ML
INTRAVENOUS | Status: AC
  Filled 2019-07-18: qty 5

## 2019-07-18 MED ORDER — DEXAMETHASONE SODIUM PHOSPHATE 10 MG/ML IJ SOLN
4.0000 mg | Freq: Once | INTRAMUSCULAR | Status: AC
  Administered 2019-07-18: 4 mg via INTRAVENOUS

## 2019-07-18 MED ORDER — SODIUM CHLORIDE 0.9% FLUSH
10.0000 mL | Freq: Once | INTRAVENOUS | Status: AC | PRN
  Administered 2019-07-18: 10 mL
  Filled 2019-07-18: qty 10

## 2019-07-18 NOTE — Progress Notes (Signed)
Per Wilber Bihari NP, OK to treat with AST 49 and ALT 102

## 2019-07-18 NOTE — Progress Notes (Signed)
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   She completed four cycles of adjuvant Doxorubicin and Cyclophosphamide, and is now receive weekly Paclitaxel.  Today is week 10 of her treatment.  She is tolerating this well and has no peripheral neuropathy.  REVIEW OF SYSTEMS: Tonya Whitehead is doing well today.  She is fatigued.  She denies any new issues.  She is wanting information on whether or not an oopherectomy would be beneficial in her cancer treatment considering its estrogen positivity and her mom's recent diagnosis of ovarian cancer.  We have talked about Goserelin injections before, when she was having a cycle and due to the fact that she is young and has estrogen positive breast cancer.  Tonya Whitehead denies any new issues such as fever, chills, chest pain, palpitations, cough, bowel/bladder issues, nausea, vomiting, or any other concerns.  A detailed ROS was otherwise non contributory.    HISTORY OF CURRENT ILLNESS: From the original intake note:  Tonya Whitehead notes a history of reconstructive left breast surgery in 2002 while living in [place], [place]. That was for symmetry. The left breast  accordingly had some scar tissue and she thought that was what she was noting until mid March, when she felt the left breast was changing more and the nipple seemed to be sinking subtly.  She brought this to Dr Dahlia Bailiff attention and underwent bilateral diagnostic mammography with tomography and bilateral breast ultrasonography at [facility] on 12/01/2018 showing: breast density category C; findings concerning for extensive malignancy throughout the left breast predominately involving the upper- and lower-outer quadrants.  The mass was palpable within the upper outer left breast and there was left nipple retraction as well as indentation along the inferior margin of the left breast.  Targeted ultrasound showed three masses measuring 5.6 cm (2 o'clock position), 4.0 cm (close to the nipple and contiguous to the prior mass), and 3.3 cm (6 o'clock position.).  Also noted was a borderline left axillary lymph node adjacent to the left axillary artery (and so not easy to biopsy).  In the right breast was noted a 1.3 cm cyst.  On 12/08/2018 Tonya Whitehead proceeded to biopsy of 2 of the left breast areas in question. The pathology from this procedure ([id number]) showed: invasive mammary carcinoma, grade 2, in two cores (at 2 o'clock and 6 o'clock) with identical histomorphology; e-cadherin is negative, consistent with a lobular phenotype. Prognostic indicators significant for: estrogen receptor, 70% positive and progesterone receptor, 70% positive, both with strong staining intensity. Proliferation marker Ki67 at 3%. HER2 negative by immunohistochemistry (1+).  Note also the patient had a thyroid ultrasound 07/18/2018 showing bilateral solitary nodules, which were biopsied 08/09/2018.  Both showed follicular nodules, Bethesda category 2 (benign).  The patient's subsequent history is as detailed below.   PAST MEDICAL HISTORY: Past Medical  History:  Diagnosis Date  . Breast cancer ([place]) 12/08/2018   invasive  lobular  . Family history of bladder cancer   . Family history of colon cancer   . Family history of lung cancer   . Family history of ovarian cancer     PAST SURGICAL HISTORY: Past Surgical History:  Procedure Laterality Date  . ADENOIDECTOMY     as a child  . BREAST EXCISIONAL BIOPSY Left 2002   reconstructive surgery on nipple to match right breast  . IR IMAGING GUIDED PORT INSERTION  03/08/2019  Adenoidectomy as a child.   FAMILY HISTORY: Family History  Problem Relation Age of Onset  . Lung cancer Mother 33       smoker  . Ovarian cancer Mother 43  . Colon cancer Maternal Aunt        26s  . Bladder Cancer Paternal Grandfather        death was unrelated  . Cancer Paternal Aunt        through [facility], blood cancer   As of May 2020 patient's father is alive at age 72. Patient's mother is also living at age 34. She has ovarian cancer, diagnosed in 2019, and lung cancer (stage IV), diagnosed in 2016. She tested negative for BRCA.  A maternal aunt had colon cancer in her 28s.  3 other siblings of the patient's mother have not had any cancer.  On the paternal side the paternal grandmother had bladder cancer.  A paternal great aunt died from cancer of the pancreas.     GYNECOLOGIC HISTORY:  No LMP recorded. Menarche: 43 years old Age at first live birth: 43 years old GX P 3 LMP every month, 5 total days with 2 heavy Contraceptive no, husband with vasectomy. HRT n/a  Hysterectomy? no BSO? 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 Husband [name] is a physical therapist. He is biologically [place] and was adopted by a [place] family.  The patient lives at home with her husband and their 3 children: Tonya Whitehead is 71, Tonya Whitehead is 53, and  Tonya Whitehead, 22.  The patient attends a [employer].    ADVANCED DIRECTIVES: Husband [name] is her HCPOA   HEALTH MAINTENANCE: Social History   Tobacco Use  . Smoking status: Never Smoker  .  Smokeless tobacco: Never Used  Substance Use Topics  . Alcohol use: Not on file  . Drug use: Never     Colonoscopy: never done  PAP: not on file  Bone density: never done   Allergies  Allergen Reactions  . Gluten Meal Hives    Joint pain, upset stomach  . Sulfa Antibiotics     Unknown reaction " I was told I had a reaction when I was a baby "    Current Outpatient Medications  Medication Sig Dispense Refill  . gabapentin (NEURONTIN) 300 MG capsule Take 1 capsule (300 mg total) by mouth at bedtime. 90 capsule 4  . lidocaine-prilocaine (EMLA) cream Apply to affected area once 30 g 3  . polyethylene glycol (MIRALAX / GLYCOLAX) 17 g packet Take 17 g by mouth daily as needed for mild constipation.    . promethazine (PHENERGAN) 25 MG tablet Take 1 tablet (25 mg total) by mouth every 6 (six) hours as needed for nausea or vomiting. 30 tablet 1  . levothyroxine (SYNTHROID) 25 MCG tablet Take 1 tablet (25 mcg total) by mouth daily before breakfast. (Patient not taking: Reported on 07/18/2019)  60 tablet 1  . loratadine (CLARITIN) 10 MG tablet Take 10 mg by mouth daily.    Marland Kitchen LORazepam (ATIVAN) 0.5 MG tablet TAKE ONE TABLET BY MOUTH EVERY 6 HOURS AS NEEDED FOR NAUSEA (Patient not taking: Reported on 07/18/2019) 40 tablet 0  . metoCLOPramide (REGLAN) 5 MG tablet Take 1 tablet (5 mg total) by mouth 4 (four) times daily -  before meals and at bedtime. (Patient not taking: Reported on 07/18/2019) 40 tablet 3  . ondansetron (ZOFRAN-ODT) 4 MG disintegrating tablet     . prochlorperazine (COMPAZINE) 10 MG tablet Take 1 tablet (10 mg total) by mouth every 6 (six) hours as needed (Nausea or vomiting). (Patient not taking: Reported on 07/18/2019) 30 tablet 1  . PROMETHEGAN 25 MG suppository INSERT 1 SUPPOSITORY RECTALLY EVERY 6 HOURS AS NEEDED FOR NAUSEA OR VOMITING *USE IF ACTIVELY VOMITING* (Patient not taking: Reported on 07/18/2019) 12 suppository 0   No current facility-administered medications for  this visit.   Facility-Administered Medications Ordered in Other Visits  Medication Dose Route Frequency Provider Last Rate Last Admin  . heparin lock flush 100 unit/mL  500 Units Intracatheter Once PRN Magrinat, Virgie Dad, MD      . PACLitaxel (TAXOL) 102 mg in sodium chloride 0.9 % 250 mL chemo infusion (</= 64m/m2)  60 mg/m2 (Order-Specific) Intravenous Once Magrinat, GVirgie Dad MD      . sodium chloride flush (NS) 0.9 % injection 10 mL  10 mL Intracatheter PRN Magrinat, GVirgie Dad MD        OBJECTIVE:   Vitals:   07/18/19 1013  BP: 103/67  Pulse: 97  Resp: 18  Temp: 97.8 F (36.6 C)  SpO2: 100%   Wt Readings from Last 3 Encounters:  07/18/19 144 lb 12.8 oz (65.7 kg)  07/11/19 145 lb 8 oz (66 kg)  07/04/19 143 lb 8 oz (65.1 kg)   Body mass index is 24.85 kg/m.    ECOG FS:1 - Symptomatic but completely ambulatory GENERAL: Patient is a well appearing female in no acute distress HEENT:  Sclerae anicteric. Mask in place. Neck is supple.  NODES:  No cervical, supraclavicular, or axillary lymphadenopathy palpated.  BREAST EXAM:  Deferred today LUNGS:  Clear to auscultation bilaterally.  No wheezes or rhonchi. HEART:  Regular rate and rhythm. No murmur appreciated. ABDOMEN:  Soft, nontender.  Positive, normoactive bowel sounds. No organomegaly palpated. MSK:  No focal spinal tenderness to palpation.  EXTREMITIES:  No peripheral edema.   SKIN:  No skin rash or lesion. No nail dyscrasia. NEURO:  Nonfocal. Well oriented.  Appropriate affect.    LAB RESULTS:  CMP     Component Value Date/Time   NA 139 07/18/2019 0940   K 4.4 07/18/2019 0940   CL 104 07/18/2019 0940   CO2 26 07/18/2019 0940   GLUCOSE 105 (H) 07/18/2019 0940   BUN 10 07/18/2019 0940   CREATININE 0.65 07/18/2019 0940   CREATININE 0.85 03/20/2019 1022   CALCIUM 9.0 07/18/2019 0940   PROT 6.6 07/18/2019 0940   ALBUMIN 4.0 07/18/2019 0940   AST 49 (H) 07/18/2019 0940   AST 13 (L) 03/20/2019 1022   ALT 102  (H) 07/18/2019 0940   ALT 40 03/20/2019 1022   ALKPHOS 65 07/18/2019 0940   BILITOT 0.4 07/18/2019 0940   BILITOT 0.7 03/20/2019 1022   GFRNONAA >60 07/18/2019 0940   GFRNONAA >60 03/20/2019 1022   GFRAA >60 07/18/2019 0940   GFRAA >60 03/20/2019 1022    No results found  for: TOTALPROTELP, ALBUMINELP, A1GS, A2GS, BETS, BETA2SER, GAMS, MSPIKE, SPEI  No results found for: KPAFRELGTCHN, LAMBDASER, KAPLAMBRATIO  Lab Results  Component Value Date   WBC 4.2 07/18/2019   NEUTROABS 3.2 07/18/2019   HGB 12.2 07/18/2019   HCT 37.2 07/18/2019   MCV 96.6 07/18/2019   PLT 262 07/18/2019    No results found for: LABCA2  No components found for: ZLDJTT017  No results for input(s): INR in the last 168 hours.  No results found for: LABCA2  No results found for: BLT903  No results found for: ESP233  No results found for: AQT622  No results found for: CA2729  No components found for: HGQUANT  No results found for: CEA1 / No results found for: CEA1   No results found for: AFPTUMOR  No results found for: CHROMOGRNA  No results found for: PSA1  Appointment on 07/18/2019  Component Date Value Ref Range Status  . Sodium 07/18/2019 139  135 - 145 mmol/L Final  . Potassium 07/18/2019 4.4  3.5 - 5.1 mmol/L Final  . Chloride 07/18/2019 104  98 - 111 mmol/L Final  . CO2 07/18/2019 26  22 - 32 mmol/L Final  . Glucose, Bld 07/18/2019 105* 70 - 99 mg/dL Final  . BUN 07/18/2019 10  6 - 20 mg/dL Final  . Creatinine, Ser 07/18/2019 0.65  0.44 - 1.00 mg/dL Final  . Calcium 07/18/2019 9.0  8.9 - 10.3 mg/dL Final  . Total Protein 07/18/2019 6.6  6.5 - 8.1 g/dL Final  . Albumin 07/18/2019 4.0  3.5 - 5.0 g/dL Final  . AST 07/18/2019 49* 15 - 41 U/L Final  . ALT 07/18/2019 102* 0 - 44 U/L Final  . Alkaline Phosphatase 07/18/2019 65  38 - 126 U/L Final  . Total Bilirubin 07/18/2019 0.4  0.3 - 1.2 mg/dL Final  . GFR calc non Af Amer 07/18/2019 >60  >60 mL/min Final  . GFR calc Af Amer  07/18/2019 >60  >60 mL/min Final  . Anion gap 07/18/2019 9  5 - 15 Final   Performed at [facility], [place] [address], [place], [place] [address]  . WBC 07/18/2019 4.2  4.0 - 10.5 K/uL Final  . RBC 07/18/2019 3.85* 3.87 - 5.11 MIL/uL Final  . Hemoglobin 07/18/2019 12.2  12.0 - 15.0 g/dL Final  . HCT 07/18/2019 37.2  36.0 - 46.0 % Final  . MCV 07/18/2019 96.6  80.0 - 100.0 fL Final  . MCH 07/18/2019 31.7  26.0 - 34.0 pg Final  . MCHC 07/18/2019 32.8  30.0 - 36.0 g/dL Final  . RDW 07/18/2019 14.7  11.5 - 15.5 % Final  . Platelets 07/18/2019 262  150 - 400 K/uL Final  . nRBC 07/18/2019 0.0  0.0 - 0.2 % Final  . Neutrophils Relative % 07/18/2019 78  % Final  . Neutro Abs 07/18/2019 3.2  1.7 - 7.7 K/uL Final  . Lymphocytes Relative 07/18/2019 10  % Final  . Lymphs Abs 07/18/2019 0.4* 0.7 - 4.0 K/uL Final  . Monocytes Relative 07/18/2019 9  % Final  . Monocytes Absolute 07/18/2019 0.4  0.1 - 1.0 K/uL Final  . Eosinophils Relative 07/18/2019 1  % Final  . Eosinophils Absolute 07/18/2019 0.1  0.0 - 0.5 K/uL Final  . Basophils Relative 07/18/2019 1  % Final  . Basophils Absolute 07/18/2019 0.1  0.0 - 0.1 K/uL Final  . Immature Granulocytes 07/18/2019 1  % Final  . Abs Immature Granulocytes 07/18/2019 0.06  0.00 -  0.07 K/uL Final   Performed at [facility], [place] [address] [place], [place] [address]    (this displays the last labs from the last 3 days)  No results found for: TOTALPROTELP, ALBUMINELP, A1GS, A2GS, BETS, BETA2SER, GAMS, MSPIKE, SPEI (this displays SPEP labs)  No results found for: KPAFRELGTCHN, LAMBDASER, KAPLAMBRATIO (kappa/lambda light chains)  No results found for: HGBA, HGBA2QUANT, HGBFQUANT, HGBSQUAN (Hemoglobinopathy evaluation)   No results found for: LDH  No results found for: IRON, TIBC, IRONPCTSAT (Iron and TIBC)  No results found for: FERRITIN  Urinalysis    Component Value Date/Time   COLORURINE STRAW  (A) 05/09/2019 [place] 05/09/2019 1234   LABSPEC 1.003 (L) 05/09/2019 1234   PHURINE 6.0 05/09/2019 [place] 05/09/2019 [place] 05/09/2019 [place] 05/09/2019 [place] 05/09/2019 [place] 05/09/2019 1234   NITRITE NEGATIVE 05/09/2019 [place] 05/09/2019 1234     STUDIES: No results found.   ELIGIBLE FOR AVAILABLE RESEARCH PROTOCOL: no  ASSESSMENT: 43 y.o. [place] woman status post left breast biopsy x2 on 12/08/2018, for multicentric invasive lobular breast cancer, grade 2, estrogen and progesterone receptor positive, HER-2 nonamplified, with an MIB-1 of 3%  (a) bilateral breast MRI 12/20/2018 shows multiple masses throughout the left breast, with nipple retraction, but no evidence of chest wall invasion, no abnormal left axillary lymph nodes, and no findings of concern in the right breast  (b) chest CT scan and bone scan 12/29/2018 show only an incidental goiter  (1) status post left skin sparing mastectomy at [facility] 01/16/2019 for an mpT3 pN1, stage IIA invasive lobular carcinoma, grade 2; repeat prognostic panel again estrogen and progesterone receptor strongly positive, HER-2 nonamplified.  (a) a total of 8 lymph nodes removed, one with macrometastasis (8 mm, no ECE)  (2) adjuvant chemotherapy will consist of doxorubicin and cyclophosphamide in dose dense fashion x4 starting 03/13/2019, to be followed by weekly paclitaxel x12  (a) echo 03/09/2019 finds normal left ventricular function at 55-60%  (b) cycle 2 doxorubicin and cyclophosphamide delayed 1 week secondary to nausea problems  (3) adjuvant radiation to follow  (4) antiestrogens to start at the completion of local treatment  (5) genetics testing 01/09/2019 through the [facility] STAT Breast Cancer Panel + Common Hereditary Cancers Panel found no deleterious mutations in ATM, BRCA1, BRCA2, CDH1, CHEK2,  PALB2, PTEN, STK11 and TP53, APC, ATM, AXIN2, BARD1, BMPR1A, BRCA1, BRCA2, BRIP1, CDH1, CDKN2A (p14ARF), CDKN2A (p16INK4a), CKD4, CHEK2, CTNNA1, DICER1, EPCAM (Deletion/duplication testing only), GREM1 (promoter region deletion/duplication testing only), KIT, MEN1, MLH1, MSH2, MSH3, MSH6, MUTYH, NBN, NF1, NHTL1, PALB2, PDGFRA, PMS2, POLD1, POLE, PTEN, RAD50, RAD51C, RAD51D SDHB, SDHC, SDHD, SMAD4, SMARCA4. STK11, TP53, TSC1, TSC2, and VHL.  The following genes were evaluated for sequence changes only: SDHA and HOXB13 c.251G>A variant only.   (6) vasomotor symptoms  PLAN: Tonya Whitehead continues on treatment with weekly Paclitaxel with good tolerance.  Her CBC is stable today, and she continues on her current dose reduction by 25% due to her LFT elevation which has remained stable.  Tonya Whitehead has no peripheral neuropathy and we are monitoring her very closely for this.    Tonya Whitehead and I talked about oopherectomy.  Considering the fact that she has estrogen positive breast cancer, ovarian suppression can certainly have some benefit, along with aromatase inhibitor in her adjuvant therapy.  We had previously discussed goserelin during her initial chemotherapy when  she had a heavy cycle.  We reviewed that oopherectomy is permanent menopause, and it can be beneficial with her breast cancer, and she would also have the additional benefit of anxiety reduction, since her mom has been recently diagnosed with ovarian cancer.  I recommended she talk to Dr. Jana Hakim about it further next week.    Tonya Whitehead will continue with healthy diet and exercise.    We will see Tonya Whitehead back in 1 week for labs, f/u, and her next chemotherapy.  She was recommended to continue with the appropriate pandemic precautions. She knows to call for any questions that may arise between now and her next appointment.  We are happy to see her sooner if needed.  A total of (15) minutes of face-to-face time was spent with this patient with greater than  50% of that time in counseling and care-coordination.    Wilber Bihari, NP  07/18/19 11:27 AM Medical Oncology and Hematology [facility] [place], [place] [address] Tel. [phone]    Fax. [phone]

## 2019-07-18 NOTE — Patient Instructions (Signed)

## 2019-07-18 NOTE — Patient Instructions (Signed)
[facility] Discharge Instructions for Patients Receiving Chemotherapy  Today you received the following chemotherapy agents:  Taxol.  To help prevent nausea and vomiting after your treatment, we encourage you to take your nausea medication as directed.   If you develop nausea and vomiting that is not controlled by your nausea medication, call the clinic.   BELOW ARE SYMPTOMS THAT SHOULD BE REPORTED IMMEDIATELY:  *FEVER GREATER THAN 100.5 F  *CHILLS WITH OR WITHOUT FEVER  NAUSEA AND VOMITING THAT IS NOT CONTROLLED WITH YOUR NAUSEA MEDICATION  *UNUSUAL SHORTNESS OF BREATH  *UNUSUAL BRUISING OR BLEEDING  TENDERNESS IN MOUTH AND THROAT WITH OR WITHOUT PRESENCE OF ULCERS  *URINARY PROBLEMS  *BOWEL PROBLEMS  UNUSUAL RASH Items with * indicate a potential emergency and should be followed up as soon as possible.  Feel free to call the clinic should you have any questions or concerns. The clinic phone number is [phone].  Please show the CHEMO ALERT CARD at check-in to the Emergency Department and triage nurse.   

## 2019-07-24 NOTE — Progress Notes (Signed)
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   She continues on weekly Paclitaxel.  Today is week 11 of 12 planned doses.  She is tolerating this well and has no peripheral neuropathy.  The only concern has been mild but stable elevations in her transaminases.  She did have some nausea on day 3 after the last cycle, and she used some peppermint tea to control that.  She has multiple antinausea medicines available although the one she uses if needed is Phenergan.   REVIEW OF SYSTEMS: Despite the liver irritation she has had no alteration of taste and in fact she is eating "too much" she says.  She is having normal bowel movements.  She denies unusual headaches mouth sores cough phlegm production pleurisy or rash or fever problems.  There has been absolutely no peripheral neuropathy today.  Detailed review of systems today was otherwise stable.     HISTORY OF CURRENT ILLNESS: From the original intake note:  Tonya Whitehead notes a history of reconstructive left breast surgery in 2002 while living in [place], [place]. That was for symmetry. The left breast accordingly had some scar tissue and she  thought that was what she was noting until mid March, when she felt the left breast was changing more and the nipple seemed to be sinking subtly.  She brought this to Dr Dahlia Bailiff attention and underwent bilateral diagnostic mammography with tomography and bilateral breast ultrasonography at [facility] on 12/01/2018 showing: breast density category C; findings concerning for extensive malignancy throughout the left breast predominately involving the upper- and lower-outer quadrants.  The mass was palpable within the upper outer left breast and there was left nipple retraction as well as indentation along the inferior margin of the left breast.  Targeted ultrasound showed three masses measuring 5.6 cm (2 o'clock position), 4.0 cm (close to the nipple and contiguous to the prior mass), and 3.3 cm (6 o'clock position.).  Also noted was a borderline left axillary lymph node adjacent to the left axillary artery (and so not easy to biopsy).  In the right breast was noted a 1.3 cm cyst.  On 12/08/2018 [facility] proceeded to biopsy of 2 of the left breast areas in question. The pathology from this procedure ([id number]) showed: invasive mammary carcinoma, grade 2, in two cores (at 2 o'clock and 6 o'clock) with identical histomorphology; e-cadherin is negative, consistent with a lobular phenotype. Prognostic indicators significant for: estrogen receptor, 70% positive and progesterone receptor, 70% positive, both with strong staining intensity. Proliferation marker Ki67 at 3%. HER2 negative by immunohistochemistry (1+).  Note also the patient had a thyroid ultrasound 07/18/2018 showing bilateral solitary nodules, which were biopsied 08/09/2018.  Both showed follicular nodules, Bethesda category 2 (benign).  The patient's subsequent history is as detailed below.   PAST MEDICAL HISTORY:  Past Medical History:  Diagnosis Date  . Breast cancer ([place]) 12/08/2018   invasive lobular  . Family history of bladder  cancer   . Family history of colon cancer   . Family history of lung cancer   . Family history of ovarian cancer     PAST SURGICAL HISTORY: Past Surgical History:  Procedure Laterality Date  . ADENOIDECTOMY     as a child  . BREAST EXCISIONAL BIOPSY Left 2002   reconstructive surgery on nipple to match right breast  . IR IMAGING GUIDED PORT INSERTION  03/08/2019  Adenoidectomy as a child.   FAMILY HISTORY: Family History  Problem Relation Age of Onset  . Lung cancer Mother 87       smoker  . Ovarian cancer Mother 42  . Colon cancer Maternal Aunt        64s  . Bladder Cancer Paternal Grandfather        death was unrelated  . Cancer Paternal Aunt        through [facility], blood cancer   As of May 2020 patient's father is alive at age 42. Patient's mother is also living at age 42. She has ovarian cancer, diagnosed in 2019, and lung cancer (stage IV), diagnosed in 2016. She tested negative for BRCA.  A maternal aunt had colon cancer in her 36s.  3 other siblings of the patient's mother have not had any cancer.  On the paternal side the paternal grandmother had bladder cancer.  A paternal great aunt died from cancer of the pancreas.     GYNECOLOGIC HISTORY:  No LMP recorded. Menarche: 43 years old Age at first live birth: 43 years old GX P 3 LMP every month, 5 total days with 2 heavy Contraceptive no, husband with vasectomy. HRT n/a  Hysterectomy? no BSO? 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 Husband [name] is a physical therapist. He is biologically [place] and was adopted by a [place] family.  The patient lives at home with her husband and their 3 children: [name] is 73, [name] is 29, and  [name], 61.  The patient attends a [employer].    ADVANCED DIRECTIVES: Husband [name] is her HCPOA   HEALTH MAINTENANCE: Social History   Tobacco Use  . Smoking status: Never Smoker  . Smokeless tobacco: Never Used    Substance Use Topics  . Alcohol use: Not on file  . Drug use: Never     Colonoscopy: never done  PAP: not on file  Bone density: never done   Allergies  Allergen Reactions  . Gluten Meal Hives    Joint pain, upset stomach  . Sulfa Antibiotics     Unknown reaction " I was told I had a reaction when I was a baby "    Current Outpatient Medications  Medication Sig Dispense Refill  . gabapentin (NEURONTIN) 300 MG capsule Take 1 capsule (300 mg total) by mouth at bedtime. 90 capsule 4  . levothyroxine (SYNTHROID) 25 MCG tablet Take 1 tablet (25 mcg total) by mouth daily before breakfast. (Patient not taking: Reported on 07/18/2019) 60 tablet 1  . lidocaine-prilocaine (EMLA) cream Apply to affected area once 30 g 3  . loratadine (CLARITIN) 10 MG tablet Take 10 mg by mouth daily.    Marland Kitchen LORazepam (ATIVAN) 0.5 MG tablet TAKE ONE TABLET BY MOUTH EVERY 6 HOURS AS NEEDED FOR NAUSEA (Patient not taking: Reported on 07/18/2019) 40 tablet 0  .  metoCLOPramide (REGLAN) 5 MG tablet Take 1 tablet (5 mg total) by mouth 4 (four) times daily -  before meals and at bedtime. (Patient not taking: Reported on 07/18/2019) 40 tablet 3  . ondansetron (ZOFRAN-ODT) 4 MG disintegrating tablet     . polyethylene glycol (MIRALAX / GLYCOLAX) 17 g packet Take 17 g by mouth daily as needed for mild constipation.    . prochlorperazine (COMPAZINE) 10 MG tablet Take 1 tablet (10 mg total) by mouth every 6 (six) hours as needed (Nausea or vomiting). (Patient not taking: Reported on 07/18/2019) 30 tablet 1  . promethazine (PHENERGAN) 25 MG tablet Take 1 tablet (25 mg total) by mouth every 6 (six) hours as needed for nausea or vomiting. 30 tablet 1  . PROMETHEGAN 25 MG suppository INSERT 1 SUPPOSITORY RECTALLY EVERY 6 HOURS AS NEEDED FOR NAUSEA OR VOMITING *USE IF ACTIVELY VOMITING* (Patient not taking: Reported on 07/18/2019) 12 suppository 0   No current facility-administered medications for this visit.    OBJECTIVE:  Young white Whitehead in no acute distress  Vitals:   07/25/19 0907  BP: 119/78  Pulse: 90  Resp: 18  Temp: 98 F (36.7 C)  SpO2: 99%   Wt Readings from Last 3 Encounters:  07/25/19 147 lb 14.4 oz (67.1 kg)  07/18/19 144 lb 12.8 oz (65.7 kg)  07/11/19 145 lb 8 oz (66 kg)   Body mass index is 25.39 kg/m.    ECOG FS:1 - Symptomatic but completely ambulatory  Sclerae unicteric, EOMs intact Wearing a mask No cervical or supraclavicular adenopathy Lungs no rales or rhonchi Heart regular rate and rhythm Abd soft, nontender, positive bowel sounds MSK no focal spinal tenderness, no upper extremity lymphedema Neuro: nonfocal, well oriented, appropriate affect Breasts: Deferred   LAB RESULTS:  CMP     Component Value Date/Time   NA 139 07/18/2019 0940   K 4.4 07/18/2019 0940   CL 104 07/18/2019 0940   CO2 26 07/18/2019 0940   GLUCOSE 105 (H) 07/18/2019 0940   BUN 10 07/18/2019 0940   CREATININE 0.65 07/18/2019 0940   CREATININE 0.85 03/20/2019 1022   CALCIUM 9.0 07/18/2019 0940   PROT 6.6 07/18/2019 0940   ALBUMIN 4.0 07/18/2019 0940   AST 49 (H) 07/18/2019 0940   AST 13 (L) 03/20/2019 1022   ALT 102 (H) 07/18/2019 0940   ALT 40 03/20/2019 1022   ALKPHOS 65 07/18/2019 0940   BILITOT 0.4 07/18/2019 0940   BILITOT 0.7 03/20/2019 1022   GFRNONAA >60 07/18/2019 0940   GFRNONAA >60 03/20/2019 1022   GFRAA >60 07/18/2019 0940   GFRAA >60 03/20/2019 1022    No results found for: TOTALPROTELP, ALBUMINELP, A1GS, A2GS, BETS, BETA2SER, GAMS, MSPIKE, SPEI  No results found for: KPAFRELGTCHN, LAMBDASER, KAPLAMBRATIO  Lab Results  Component Value Date   WBC 4.3 07/25/2019   NEUTROABS 3.3 07/25/2019   HGB 12.3 07/25/2019   HCT 37.5 07/25/2019   MCV 95.9 07/25/2019   PLT 276 07/25/2019    No results found for: LABCA2  No components found for: [id number]  No results for input(s): INR in the last 168 hours.  No results found for: LABCA2  No results found for:  QBH419  No results found for: FXT024  No results found for: OXB353  No results found for: CA2729  No components found for: HGQUANT  No results found for: CEA1 / No results found for: CEA1   No results found for: AFPTUMOR  No results found for: [facility]  No results found for: HGBA, HGBA2QUANT, HGBFQUANT, HGBSQUAN (Hemoglobinopathy evaluation)   No results found for: LDH  No results found for: IRON, TIBC, IRONPCTSAT (Iron and TIBC)  No results found for: FERRITIN  Urinalysis    Component Value Date/Time   COLORURINE STRAW (A) 05/09/2019 [place] 05/09/2019 1234   LABSPEC 1.003 (L) 05/09/2019 1234   PHURINE 6.0 05/09/2019 [place] 05/09/2019 [place] 05/09/2019 [place] 05/09/2019 [place] 05/09/2019 [place] 05/09/2019 1234   NITRITE NEGATIVE 05/09/2019 1234   LEUKOCYTESUR NEGATIVE 05/09/2019 1234     STUDIES: No results found.   ELIGIBLE FOR AVAILABLE RESEARCH PROTOCOL: no  ASSESSMENT: 43 y.o. Tonya Whitehead status post left breast biopsy x2 on 12/08/2018, for multicentric invasive lobular breast cancer, grade 2, estrogen and progesterone receptor positive, HER-2 nonamplified, with an MIB-1 of 3%  (a) bilateral breast MRI 12/20/2018 shows multiple masses throughout the left breast, with nipple retraction, but no evidence of chest wall invasion, no abnormal left axillary lymph nodes, and no findings of concern in the right breast  (b) chest CT scan and bone scan 12/29/2018 show only an incidental goiter  (1) status post left skin sparing mastectomy at [facility] 01/16/2019 for an mpT3 pN1, stage IIA invasive lobular carcinoma, grade 2; repeat prognostic panel again estrogen and progesterone receptor strongly positive, HER-2 nonamplified.  (a) a total of 8 lymph nodes removed, one with macrometastasis (8 mm, no ECE)  (2) adjuvant chemotherapy will consist of  doxorubicin and cyclophosphamide in dose dense fashion x4 starting 03/13/2019, to be followed by weekly paclitaxel x12  (a) echo 03/09/2019 finds normal left ventricular function at 55-60%  (b) cycle 2 doxorubicin and cyclophosphamide delayed 1 week secondary to nausea problems  (3) adjuvant radiation to follow  (4) antiestrogens to start at the completion of local treatment  (5) genetics testing 01/09/2019 through the [facility] STAT Breast Cancer Panel + Common Hereditary Cancers Panel found no deleterious mutations in ATM, BRCA1, BRCA2, CDH1, CHEK2, PALB2, PTEN, STK11 and TP53, APC, ATM, AXIN2, BARD1, BMPR1A, BRCA1, BRCA2, BRIP1, CDH1, CDKN2A (p14ARF), CDKN2A (p16INK4a), CKD4, CHEK2, CTNNA1, DICER1, EPCAM (Deletion/duplication testing only), GREM1 (promoter region deletion/duplication testing only), KIT, MEN1, MLH1, MSH2, MSH3, MSH6, MUTYH, NBN, NF1, NHTL1, PALB2, PDGFRA, PMS2, POLD1, POLE, PTEN, RAD50, RAD51C, RAD51D SDHB, SDHC, SDHD, SMAD4, SMARCA4. STK11, TP53, TSC1, TSC2, and VHL.  The following genes were evaluated for sequence changes only: SDHA and HOXB13 c.251G>A variant only.   (6) vasomotor symptoms   PLAN: Tonya Whitehead will proceed to her penultimate dose of Taxol today.  She will have the last dose next week.  She is already scheduled to meet with Dr.Moody later in January and likely will start radiation in February.  She is already clinically in menopause, with hot flashes vaginal dryness and slight moodiness she says.  She is very concerned because of her mother's history of ovarian cancer and definitely wants her ovaries removed, instead of using goserelin for a few months.  I think that is reasonable and I will place the referral for her.  Obviously that would be done sometime in March or April of next year after she has somewhat recovered from her radiation.  She will see [place] again next week for her last dose of chemo and then I will see her again in March.  At that point we will likely  start her on an aromatase inhibitor and I will  check an [place] and estradiol level prior to that visit.   Virgie Dad. Brendyn Mclaren, MD  07/25/19 9:33 AM Medical Oncology and Hematology [facility] [place], [place] [address] Tel. [phone]    Fax. [phone]   I, Wilburn Mylar, am acting as scribe for Dr. Virgie Dad. Nicandro Perrault.  I, Lurline Del MD, have reviewed the above documentation for accuracy and completeness, and I agree with the above.

## 2019-07-25 ENCOUNTER — Other Ambulatory Visit: Payer: Self-pay

## 2019-07-25 ENCOUNTER — Inpatient Hospital Stay (HOSPITAL_BASED_OUTPATIENT_CLINIC_OR_DEPARTMENT_OTHER): Payer: 59 | Admitting: Oncology

## 2019-07-25 ENCOUNTER — Inpatient Hospital Stay: Payer: 59

## 2019-07-25 VITALS — BP 119/78 | HR 90 | Temp 98.0°F | Resp 18 | Ht 64.0 in | Wt 147.9 lb

## 2019-07-25 DIAGNOSIS — C50812 Malignant neoplasm of overlapping sites of left female breast: Secondary | ICD-10-CM

## 2019-07-25 DIAGNOSIS — Z5111 Encounter for antineoplastic chemotherapy: Secondary | ICD-10-CM | POA: Diagnosis not present

## 2019-07-25 DIAGNOSIS — C50412 Malignant neoplasm of upper-outer quadrant of left female breast: Secondary | ICD-10-CM

## 2019-07-25 DIAGNOSIS — Z170 Estrogen receptor positive status [ER+]: Secondary | ICD-10-CM

## 2019-07-25 DIAGNOSIS — Z95828 Presence of other vascular implants and grafts: Secondary | ICD-10-CM

## 2019-07-25 LAB — COMPREHENSIVE METABOLIC PANEL
ALT: 47 U/L — ABNORMAL HIGH (ref 0–44)
AST: 20 U/L (ref 15–41)
Albumin: 3.9 g/dL (ref 3.5–5.0)
Alkaline Phosphatase: 52 U/L (ref 38–126)
Anion gap: 7 (ref 5–15)
BUN: 15 mg/dL (ref 6–20)
CO2: 25 mmol/L (ref 22–32)
Calcium: 8.9 mg/dL (ref 8.9–10.3)
Chloride: 106 mmol/L (ref 98–111)
Creatinine, Ser: 0.64 mg/dL (ref 0.44–1.00)
GFR calc Af Amer: >60 mL/min (ref >60)
GFR calc non Af Amer: >60 mL/min (ref >60)
Glucose, Bld: 109 mg/dL — ABNORMAL HIGH (ref 70–99)
Potassium: 4.4 mmol/L (ref 3.5–5.1)
Sodium: 138 mmol/L (ref 135–145)
Total Bilirubin: 0.3 mg/dL (ref 0.3–1.2)
Total Protein: 6.5 g/dL (ref 6.5–8.1)

## 2019-07-25 LAB — CBC WITH DIFFERENTIAL/PLATELET
Abs Immature Granulocytes: 0.04 10*3/uL (ref 0.00–0.07)
Basophils Absolute: 0 10*3/uL (ref 0.0–0.1)
Basophils Relative: 1 %
Eosinophils Absolute: 0.1 10*3/uL (ref 0.0–0.5)
Eosinophils Relative: 2 %
HCT: 37.5 % (ref 36.0–46.0)
Hemoglobin: 12.3 g/dL (ref 12.0–15.0)
Immature Granulocytes: 1 %
Lymphocytes Relative: 11 %
Lymphs Abs: 0.5 10*3/uL — ABNORMAL LOW (ref 0.7–4.0)
MCH: 31.5 pg (ref 26.0–34.0)
MCHC: 32.8 g/dL (ref 30.0–36.0)
MCV: 95.9 fL (ref 80.0–100.0)
Monocytes Absolute: 0.4 10*3/uL (ref 0.1–1.0)
Monocytes Relative: 9 %
Neutro Abs: 3.3 10*3/uL (ref 1.7–7.7)
Neutrophils Relative %: 76 %
Platelets: 276 10*3/uL (ref 150–400)
RBC: 3.91 MIL/uL (ref 3.87–5.11)
RDW: 14.7 % (ref 11.5–15.5)
WBC: 4.3 10*3/uL (ref 4.0–10.5)
nRBC: 0 % (ref 0.0–0.2)

## 2019-07-25 MED ORDER — DEXAMETHASONE SODIUM PHOSPHATE 10 MG/ML IJ SOLN
4.0000 mg | Freq: Once | INTRAMUSCULAR | Status: AC
  Administered 2019-07-25: 4 mg via INTRAVENOUS

## 2019-07-25 MED ORDER — PALONOSETRON HCL INJECTION 0.25 MG/5ML
0.2500 mg | Freq: Once | INTRAVENOUS | Status: AC
  Administered 2019-07-25: 0.25 mg via INTRAVENOUS

## 2019-07-25 MED ORDER — DEXAMETHASONE SODIUM PHOSPHATE 10 MG/ML IJ SOLN
INTRAMUSCULAR | Status: AC
  Filled 2019-07-25: qty 1

## 2019-07-25 MED ORDER — FAMOTIDINE IN NACL 20-0.9 MG/50ML-% IV SOLN
INTRAVENOUS | Status: AC
  Filled 2019-07-25: qty 50

## 2019-07-25 MED ORDER — DIPHENHYDRAMINE HCL 50 MG/ML IJ SOLN
INTRAMUSCULAR | Status: AC
  Filled 2019-07-25: qty 1

## 2019-07-25 MED ORDER — SODIUM CHLORIDE 0.9 % IV SOLN
60.0000 mg/m2 | Freq: Once | INTRAVENOUS | Status: AC
  Administered 2019-07-25: 102 mg via INTRAVENOUS
  Filled 2019-07-25: qty 17

## 2019-07-25 MED ORDER — SODIUM CHLORIDE 0.9% FLUSH
10.0000 mL | Freq: Once | INTRAVENOUS | Status: AC
  Administered 2019-07-25: 10 mL
  Filled 2019-07-25: qty 10

## 2019-07-25 MED ORDER — SODIUM CHLORIDE 0.9 % IV SOLN
Freq: Once | INTRAVENOUS | Status: AC
  Filled 2019-07-25: qty 250

## 2019-07-25 MED ORDER — SODIUM CHLORIDE 0.9% FLUSH
10.0000 mL | INTRAVENOUS | Status: DC | PRN
  Administered 2019-07-25: 10 mL
  Filled 2019-07-25: qty 10

## 2019-07-25 MED ORDER — FAMOTIDINE IN NACL 20-0.9 MG/50ML-% IV SOLN
20.0000 mg | Freq: Once | INTRAVENOUS | Status: AC
  Administered 2019-07-25: 20 mg via INTRAVENOUS

## 2019-07-25 MED ORDER — PALONOSETRON HCL INJECTION 0.25 MG/5ML
INTRAVENOUS | Status: AC
  Filled 2019-07-25: qty 5

## 2019-07-25 MED ORDER — DIPHENHYDRAMINE HCL 50 MG/ML IJ SOLN
25.0000 mg | Freq: Once | INTRAMUSCULAR | Status: AC
  Administered 2019-07-25: 25 mg via INTRAVENOUS

## 2019-07-25 MED ORDER — HEPARIN SOD (PORK) LOCK FLUSH 100 UNIT/ML IV SOLN
500.0000 [IU] | Freq: Once | INTRAVENOUS | Status: AC | PRN
  Administered 2019-07-25: 500 [IU]
  Filled 2019-07-25: qty 5

## 2019-07-25 NOTE — Patient Instructions (Signed)
[facility] Discharge Instructions for Patients Receiving Chemotherapy  Today you received the following chemotherapy agents: Paclitaxel (Taxol)  To help prevent nausea and vomiting after your treatment, we encourage you to take your nausea medication as directed.    If you develop nausea and vomiting that is not controlled by your nausea medication, call the clinic.   BELOW ARE SYMPTOMS THAT SHOULD BE REPORTED IMMEDIATELY:  *FEVER GREATER THAN 100.5 F  *CHILLS WITH OR WITHOUT FEVER  NAUSEA AND VOMITING THAT IS NOT CONTROLLED WITH YOUR NAUSEA MEDICATION  *UNUSUAL SHORTNESS OF BREATH  *UNUSUAL BRUISING OR BLEEDING  TENDERNESS IN MOUTH AND THROAT WITH OR WITHOUT PRESENCE OF ULCERS  *URINARY PROBLEMS  *BOWEL PROBLEMS  UNUSUAL RASH Items with * indicate a potential emergency and should be followed up as soon as possible.  Feel free to call the clinic should you have any questions or concerns. The clinic phone number is [phone].  Please show the CHEMO ALERT CARD at check-in to the Emergency Department and triage nurse.  Coronavirus (COVID-19) Are you at risk?  Are you at risk for the Coronavirus (COVID-19)?  To be considered HIGH RISK for Coronavirus (COVID-19), you have to meet the following criteria:  . Traveled to [place], [place], [place], [place] or [place]; or in the [place] to [place], [place], [place], or [place]; and have fever, cough, and shortness of breath within the last 2 weeks of travel OR . Been in close contact with a person diagnosed with COVID-19 within the last 2 weeks and have fever, cough, and shortness of breath . IF YOU DO NOT MEET THESE CRITERIA, YOU ARE CONSIDERED LOW RISK FOR COVID-19.  What to do if you are HIGH RISK for COVID-19?  . If you are having a medical emergency, call 911. . Seek medical care right away. Before you go to a doctor's office, urgent care or emergency department, call ahead and tell them about  your recent travel, contact with someone diagnosed with COVID-19, and your symptoms. You should receive instructions from your physician's office regarding next steps of care.  . When you arrive at healthcare provider, tell the healthcare staff immediately you have returned from visiting [place], [place], [place], [place] or [place]; or traveled in the [place] to [place], [place], [place], or [place]; in the last two weeks or you have been in close contact with a person diagnosed with COVID-19 in the last 2 weeks.   . Tell the health care staff about your symptoms: fever, cough and shortness of breath. . After you have been seen by a medical provider, you will be either: o Tested for (COVID-19) and discharged home on quarantine except to seek medical care if symptoms worsen, and asked to  - Stay home and avoid contact with others until you get your results (4-5 days)  - Avoid travel on public transportation if possible (such as bus, train, or airplane) or o Sent to the Emergency Department by EMS for evaluation, COVID-19 testing, and possible admission depending on your condition and test results.  What to do if you are LOW RISK for COVID-19?  Reduce your risk of any infection by using the same precautions used for avoiding the common cold or flu:  . Wash your hands often with soap and warm water for at least 20 seconds.  If soap and water are not readily available, use an alcohol-based hand sanitizer with at least 60% alcohol.  . If coughing   or sneezing, cover your mouth and nose by coughing or sneezing into the elbow areas of your shirt or coat, into a tissue or into your sleeve (not your hands). . Avoid shaking hands with others and consider head nods or verbal greetings only. . Avoid touching your eyes, nose, or mouth with unwashed hands.  . Avoid close contact with people who are sick. . Avoid places or events with large numbers of people in one location, like concerts or sporting  events. . Carefully consider travel plans you have or are making. . If you are planning any travel outside or inside the [place], visit the CDC's Travelers' Health webpage for the latest health notices. . If you have some symptoms but not all symptoms, continue to monitor at home and seek medical attention if your symptoms worsen. . If you are having a medical emergency, call 911.   ADDITIONAL HEALTHCARE OPTIONS FOR PATIENTS  [facility] / e-Visit: [email or web address]         [facility]: [phone]  [facility]: [phone]                   [facility]: [phone]   

## 2019-07-30 ENCOUNTER — Telehealth: Payer: Self-pay | Admitting: *Deleted

## 2019-07-31 ENCOUNTER — Encounter: Payer: Self-pay | Admitting: *Deleted

## 2019-08-01 ENCOUNTER — Inpatient Hospital Stay: Payer: 59

## 2019-08-01 ENCOUNTER — Encounter: Payer: Self-pay | Admitting: *Deleted

## 2019-08-01 ENCOUNTER — Inpatient Hospital Stay (HOSPITAL_BASED_OUTPATIENT_CLINIC_OR_DEPARTMENT_OTHER): Payer: 59 | Admitting: Adult Health

## 2019-08-01 ENCOUNTER — Other Ambulatory Visit: Payer: Self-pay | Admitting: *Deleted

## 2019-08-01 ENCOUNTER — Other Ambulatory Visit: Payer: Self-pay

## 2019-08-01 ENCOUNTER — Encounter: Payer: Self-pay | Admitting: Adult Health

## 2019-08-01 ENCOUNTER — Inpatient Hospital Stay: Payer: 59 | Attending: Oncology

## 2019-08-01 VITALS — BP 119/97 | HR 90 | Temp 98.0°F | Resp 18 | Ht 64.0 in | Wt 145.8 lb

## 2019-08-01 DIAGNOSIS — Z90722 Acquired absence of ovaries, bilateral: Secondary | ICD-10-CM | POA: Insufficient documentation

## 2019-08-01 DIAGNOSIS — C50412 Malignant neoplasm of upper-outer quadrant of left female breast: Secondary | ICD-10-CM | POA: Insufficient documentation

## 2019-08-01 DIAGNOSIS — C50812 Malignant neoplasm of overlapping sites of left female breast: Secondary | ICD-10-CM

## 2019-08-01 DIAGNOSIS — Z9012 Acquired absence of left breast and nipple: Secondary | ICD-10-CM | POA: Diagnosis not present

## 2019-08-01 DIAGNOSIS — Z170 Estrogen receptor positive status [ER+]: Secondary | ICD-10-CM | POA: Insufficient documentation

## 2019-08-01 DIAGNOSIS — Z5111 Encounter for antineoplastic chemotherapy: Secondary | ICD-10-CM | POA: Insufficient documentation

## 2019-08-01 DIAGNOSIS — R110 Nausea: Secondary | ICD-10-CM | POA: Insufficient documentation

## 2019-08-01 DIAGNOSIS — Z79899 Other long term (current) drug therapy: Secondary | ICD-10-CM | POA: Insufficient documentation

## 2019-08-01 LAB — COMPREHENSIVE METABOLIC PANEL
ALT: 50 U/L — ABNORMAL HIGH (ref 0–44)
AST: 25 U/L (ref 15–41)
Albumin: 4 g/dL (ref 3.5–5.0)
Alkaline Phosphatase: 63 U/L (ref 38–126)
Anion gap: 9 (ref 5–15)
BUN: 14 mg/dL (ref 6–20)
CO2: 25 mmol/L (ref 22–32)
Calcium: 9 mg/dL (ref 8.9–10.3)
Chloride: 105 mmol/L (ref 98–111)
Creatinine, Ser: 0.68 mg/dL (ref 0.44–1.00)
GFR calc Af Amer: >60 mL/min (ref >60)
GFR calc non Af Amer: >60 mL/min (ref >60)
Glucose, Bld: 111 mg/dL — ABNORMAL HIGH (ref 70–99)
Potassium: 4.3 mmol/L (ref 3.5–5.1)
Sodium: 139 mmol/L (ref 135–145)
Total Bilirubin: 0.4 mg/dL (ref 0.3–1.2)
Total Protein: 6.8 g/dL (ref 6.5–8.1)

## 2019-08-01 LAB — CBC WITH DIFFERENTIAL/PLATELET
Abs Immature Granulocytes: 0.04 10*3/uL (ref 0.00–0.07)
Basophils Absolute: 0 10*3/uL (ref 0.0–0.1)
Basophils Relative: 1 %
Eosinophils Absolute: 0.1 10*3/uL (ref 0.0–0.5)
Eosinophils Relative: 2 %
HCT: 38.8 % (ref 36.0–46.0)
Hemoglobin: 12.9 g/dL (ref 12.0–15.0)
Immature Granulocytes: 1 %
Lymphocytes Relative: 13 %
Lymphs Abs: 0.6 10*3/uL — ABNORMAL LOW (ref 0.7–4.0)
MCH: 30.9 pg (ref 26.0–34.0)
MCHC: 33.2 g/dL (ref 30.0–36.0)
MCV: 92.8 fL (ref 80.0–100.0)
Monocytes Absolute: 0.4 10*3/uL (ref 0.1–1.0)
Monocytes Relative: 9 %
Neutro Abs: 3.3 10*3/uL (ref 1.7–7.7)
Neutrophils Relative %: 74 %
Platelets: 275 10*3/uL (ref 150–400)
RBC: 4.18 MIL/uL (ref 3.87–5.11)
RDW: 14.7 % (ref 11.5–15.5)
WBC: 4.4 10*3/uL (ref 4.0–10.5)
nRBC: 0 % (ref 0.0–0.2)

## 2019-08-01 MED ORDER — DIPHENHYDRAMINE HCL 50 MG/ML IJ SOLN
25.0000 mg | Freq: Once | INTRAMUSCULAR | Status: AC
  Administered 2019-08-01: 25 mg via INTRAVENOUS

## 2019-08-01 MED ORDER — SODIUM CHLORIDE 0.9 % IV SOLN
60.0000 mg/m2 | Freq: Once | INTRAVENOUS | Status: AC
  Administered 2019-08-01: 102 mg via INTRAVENOUS
  Filled 2019-08-01: qty 17

## 2019-08-01 MED ORDER — PALONOSETRON HCL INJECTION 0.25 MG/5ML
0.2500 mg | Freq: Once | INTRAVENOUS | Status: AC
  Administered 2019-08-01: 0.25 mg via INTRAVENOUS

## 2019-08-01 MED ORDER — FAMOTIDINE IN NACL 20-0.9 MG/50ML-% IV SOLN
20.0000 mg | Freq: Once | INTRAVENOUS | Status: AC
  Administered 2019-08-01: 11:00:00 20 mg via INTRAVENOUS

## 2019-08-01 MED ORDER — PALONOSETRON HCL INJECTION 0.25 MG/5ML
INTRAVENOUS | Status: AC
  Filled 2019-08-01: qty 5

## 2019-08-01 MED ORDER — DIPHENHYDRAMINE HCL 50 MG/ML IJ SOLN
INTRAMUSCULAR | Status: AC
  Filled 2019-08-01: qty 1

## 2019-08-01 MED ORDER — SODIUM CHLORIDE 0.9% FLUSH
10.0000 mL | INTRAVENOUS | Status: DC | PRN
  Administered 2019-08-01: 10 mL
  Filled 2019-08-01: qty 10

## 2019-08-01 MED ORDER — HEPARIN SOD (PORK) LOCK FLUSH 100 UNIT/ML IV SOLN
500.0000 [IU] | Freq: Once | INTRAVENOUS | Status: AC | PRN
  Administered 2019-08-01: 500 [IU]
  Filled 2019-08-01: qty 5

## 2019-08-01 MED ORDER — DEXAMETHASONE SODIUM PHOSPHATE 10 MG/ML IJ SOLN
4.0000 mg | Freq: Once | INTRAMUSCULAR | Status: AC
  Administered 2019-08-01: 4 mg via INTRAVENOUS

## 2019-08-01 MED ORDER — FLUCONAZOLE 100 MG PO TABS: 100.0000 mg | ORAL_TABLET | Freq: Every day | ORAL | 0 refills | Status: DC

## 2019-08-01 MED ORDER — DEXAMETHASONE SODIUM PHOSPHATE 10 MG/ML IJ SOLN
INTRAMUSCULAR | Status: AC
  Filled 2019-08-01: qty 1

## 2019-08-01 MED ORDER — SODIUM CHLORIDE 0.9 % IV SOLN
Freq: Once | INTRAVENOUS | Status: AC
  Filled 2019-08-01: qty 250

## 2019-08-01 MED ORDER — SODIUM CHLORIDE 0.9% FLUSH
10.0000 mL | Freq: Once | INTRAVENOUS | Status: AC | PRN
  Administered 2019-08-01: 10 mL
  Filled 2019-08-01: qty 10

## 2019-08-01 MED ORDER — FAMOTIDINE IN NACL 20-0.9 MG/50ML-% IV SOLN
INTRAVENOUS | Status: AC
  Filled 2019-08-01: qty 50

## 2019-08-01 NOTE — Patient Instructions (Signed)
[facility] Discharge Instructions for Patients Receiving Chemotherapy  Today you received the following chemotherapy agents:  Taxol.  To help prevent nausea and vomiting after your treatment, we encourage you to take your nausea medication as directed.   If you develop nausea and vomiting that is not controlled by your nausea medication, call the clinic.   BELOW ARE SYMPTOMS THAT SHOULD BE REPORTED IMMEDIATELY:  *FEVER GREATER THAN 100.5 F  *CHILLS WITH OR WITHOUT FEVER  NAUSEA AND VOMITING THAT IS NOT CONTROLLED WITH YOUR NAUSEA MEDICATION  *UNUSUAL SHORTNESS OF BREATH  *UNUSUAL BRUISING OR BLEEDING  TENDERNESS IN MOUTH AND THROAT WITH OR WITHOUT PRESENCE OF ULCERS  *URINARY PROBLEMS  *BOWEL PROBLEMS  UNUSUAL RASH Items with * indicate a potential emergency and should be followed up as soon as possible.  Feel free to call the clinic should you have any questions or concerns. The clinic phone number is [phone].  Please show the CHEMO ALERT CARD at check-in to the Emergency Department and triage nurse.   

## 2019-08-01 NOTE — Progress Notes (Signed)
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   She continues on weekly Paclitaxel and is here today for her final treatment.  She notes that over the past week she has had more issues with mouth sores, white patches on her tongue, and fatigue.  She had mild intermittent numbness that was in her left third and fourth digits that has since resolved.    REVIEW OF SYSTEMS: Tonya Whitehead is looking forward to completing her chemotherapy today.  She is scheduled for port removal next week and is looking forward to having this done.  She is wondering if she should have sedation or not for the procedure.    Tonya Whitehead denies any fever, chills, chest pain, palpitations, cough, shortness of breath, headaches, vision issues, nausea, vomiting, bowel/bladder changes or any other concerns.  A detailed ROS was otherwise non contributory.       HISTORY OF CURRENT ILLNESS: From the original intake note:  Tonya Whitehead notes a history of reconstructive left breast surgery in 2002 while living in [place], [place]. That was for symmetry. The left breast accordingly had some scar tissue and she thought that was what she was  noting until mid March, when she felt the left breast was changing more and the nipple seemed to be sinking subtly.  She brought this to Dr Dahlia Bailiff attention and underwent bilateral diagnostic mammography with tomography and bilateral breast ultrasonography at [facility] on 12/01/2018 showing: breast density category C; findings concerning for extensive malignancy throughout the left breast predominately involving the upper- and lower-outer quadrants.  The mass was palpable within the upper outer left breast and there was left nipple retraction as well as indentation along the inferior margin of the left breast.  Targeted ultrasound showed three masses measuring 5.6 cm (2 o'clock position), 4.0 cm (close to the nipple and contiguous to the prior mass), and 3.3 cm (6 o'clock position.).  Also noted was a borderline left axillary lymph node adjacent to the left axillary artery (and so not easy to biopsy).  In the right breast was noted a 1.3 cm cyst.  On 12/08/2018 Tonya Whitehead proceeded to biopsy of 2 of the left breast areas in question. The pathology from this procedure ([id number]) showed: invasive mammary carcinoma, grade 2, in two cores (at 2 o'clock and 6 o'clock) with identical histomorphology; e-cadherin is negative, consistent with a lobular phenotype. Prognostic indicators significant for: estrogen receptor, 70% positive and progesterone receptor, 70% positive, both with strong staining intensity. Proliferation marker Ki67 at 3%. HER2 negative by immunohistochemistry (1+).  Note also the patient had a thyroid ultrasound 07/18/2018 showing bilateral solitary nodules, which were biopsied 08/09/2018.  Both showed follicular nodules, Bethesda category 2 (benign).  The patient's subsequent history is as detailed below.   PAST MEDICAL HISTORY: Past Medical History:  Diagnosis Date  .  Breast cancer ([place]) 12/08/2018   invasive lobular  . Family history of bladder cancer   . Family history of  colon cancer   . Family history of lung cancer   . Family history of ovarian cancer     PAST SURGICAL HISTORY: Past Surgical History:  Procedure Laterality Date  . ADENOIDECTOMY     as a child  . BREAST EXCISIONAL BIOPSY Left 2002   reconstructive surgery on nipple to match right breast  . IR IMAGING GUIDED PORT INSERTION  03/08/2019  Adenoidectomy as a child.   FAMILY HISTORY: Family History  Problem Relation Age of Onset  . Lung cancer Mother 32       smoker  . Ovarian cancer Mother 33  . Colon cancer Maternal Aunt        42s  . Bladder Cancer Paternal Grandfather        death was unrelated  . Cancer Paternal Aunt        through [facility], blood cancer   As of May 2020 patient's father is alive at age 80. Patient's mother is also living at age 71. She has ovarian cancer, diagnosed in 2019, and lung cancer (stage IV), diagnosed in 2016. She tested negative for BRCA.  A maternal aunt had colon cancer in her 35s.  3 other siblings of the patient's mother have not had any cancer.  On the paternal side the paternal grandmother had bladder cancer.  A paternal great aunt died from cancer of the pancreas.     GYNECOLOGIC HISTORY:  No LMP recorded. Menarche: 44 years old Age at first live birth: 44 years old GX P 3 LMP every month, 5 total days with 2 heavy Contraceptive no, husband with vasectomy. HRT n/a  Hysterectomy? no BSO? 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 Husband [name] is a physical therapist. He is biologically [place] and was adopted by a [place] family.  The patient lives at home with her husband and their 3 children: Tonya Whitehead is 67, Tonya Whitehead is 59, and  Tonya Whitehead, 71.  The patient attends a [employer].    ADVANCED DIRECTIVES: Husband [name] is her HCPOA   HEALTH MAINTENANCE: Social History   Tobacco Use  . Smoking status: Never Smoker  . Smokeless tobacco: Never Used  Substance Use Topics  . Alcohol use:  Not on file  . Drug use: Never     Colonoscopy: never done  PAP: not on file  Bone density: never done   Allergies  Allergen Reactions  . Gluten Meal Hives    Joint pain, upset stomach  . Sulfa Antibiotics     Unknown reaction " I was told I had a reaction when I was a baby "    Current Outpatient Medications  Medication Sig Dispense Refill  . gabapentin (NEURONTIN) 300 MG capsule Take 1 capsule (300 mg total) by mouth at bedtime. 90 capsule 4  . levothyroxine (SYNTHROID) 25 MCG tablet Take 1 tablet (25 mcg total) by mouth daily before breakfast. (Patient not taking: Reported on 07/18/2019) 60 tablet 1  . lidocaine-prilocaine (EMLA) cream Apply to affected area once 30 g 3  . loratadine (CLARITIN) 10 MG tablet Take 10 mg by mouth daily.    Marland Kitchen LORazepam (ATIVAN) 0.5 MG tablet TAKE ONE TABLET BY MOUTH EVERY 6 HOURS AS NEEDED FOR NAUSEA (Patient not taking: Reported on 07/18/2019) 40 tablet 0  . metoCLOPramide (REGLAN) 5 MG tablet Take 1 tablet (5  mg total) by mouth 4 (four) times daily -  before meals and at bedtime. (Patient not taking: Reported on 07/18/2019) 40 tablet 3  . ondansetron (ZOFRAN-ODT) 4 MG disintegrating tablet     . polyethylene glycol (MIRALAX / GLYCOLAX) 17 g packet Take 17 g by mouth daily as needed for mild constipation.    . prochlorperazine (COMPAZINE) 10 MG tablet Take 1 tablet (10 mg total) by mouth every 6 (six) hours as needed (Nausea or vomiting). (Patient not taking: Reported on 07/18/2019) 30 tablet 1  . promethazine (PHENERGAN) 25 MG tablet Take 1 tablet (25 mg total) by mouth every 6 (six) hours as needed for nausea or vomiting. 30 tablet 1  . PROMETHEGAN 25 MG suppository INSERT 1 SUPPOSITORY RECTALLY EVERY 6 HOURS AS NEEDED FOR NAUSEA OR VOMITING *USE IF ACTIVELY VOMITING* (Patient not taking: Reported on 07/18/2019) 12 suppository 0   No current facility-administered medications for this visit.    OBJECTIVE: Young white Whitehead in no acute  distress  Vitals:   08/01/19 0920  BP: (!) 119/97  Pulse: 90  Resp: 18  Temp: 98 F (36.7 C)  SpO2: 100%   Wt Readings from Last 3 Encounters:  08/01/19 145 lb 12.8 oz (66.1 kg)  07/25/19 147 lb 14.4 oz (67.1 kg)  07/18/19 144 lb 12.8 oz (65.7 kg)   Body mass index is 25.03 kg/m.    ECOG FS:1 - Symptomatic but completely ambulatory GENERAL: Patient is a well appearing female in no acute distress HEENT:  Sclerae anicteric. White patches on tongue and posterior pharynx. Neck is supple.  NODES:  No cervical, supraclavicular, or axillary lymphadenopathy palpated.  BREAST EXAM:  Deferred. LUNGS:  Clear to auscultation bilaterally.  No wheezes or rhonchi. HEART:  Regular rate and rhythm. No murmur appreciated. ABDOMEN:  Soft, nontender.  Positive, normoactive bowel sounds. No organomegaly palpated. MSK:  No focal spinal tenderness to palpation. Full range of motion bilaterally in the upper extremities. EXTREMITIES:  No peripheral edema.   SKIN:  Clear with no obvious rashes or skin changes. No nail dyscrasia. NEURO:  Nonfocal. Well oriented.  Appropriate affect.     LAB RESULTS:  CMP     Component Value Date/Time   NA 138 07/25/2019 0850   K 4.4 07/25/2019 0850   CL 106 07/25/2019 0850   CO2 25 07/25/2019 0850   GLUCOSE 109 (H) 07/25/2019 0850   BUN 15 07/25/2019 0850   CREATININE 0.64 07/25/2019 0850   CREATININE 0.85 03/20/2019 1022   CALCIUM 8.9 07/25/2019 0850   PROT 6.5 07/25/2019 0850   ALBUMIN 3.9 07/25/2019 0850   AST 20 07/25/2019 0850   AST 13 (L) 03/20/2019 1022   ALT 47 (H) 07/25/2019 0850   ALT 40 03/20/2019 1022   ALKPHOS 52 07/25/2019 0850   BILITOT 0.3 07/25/2019 0850   BILITOT 0.7 03/20/2019 1022   GFRNONAA >60 07/25/2019 0850   GFRNONAA >60 03/20/2019 1022   GFRAA >60 07/25/2019 0850   GFRAA >60 03/20/2019 1022    No results found for: TOTALPROTELP, ALBUMINELP, A1GS, A2GS, BETS, BETA2SER, GAMS, MSPIKE, SPEI  No results found for:  KPAFRELGTCHN, LAMBDASER, KAPLAMBRATIO  Lab Results  Component Value Date   WBC 4.4 08/01/2019   NEUTROABS 3.3 08/01/2019   HGB 12.9 08/01/2019   HCT 38.8 08/01/2019   MCV 92.8 08/01/2019   PLT 275 08/01/2019    No results found for: LABCA2  No components found for: XTKWIO973  No results for input(s): INR in the last 168  hours.  No results found for: LABCA2  No results found for: AST419  No results found for: QQI297  No results found for: LGX211  No results found for: CA2729  No components found for: HGQUANT  No results found for: CEA1 / No results found for: CEA1   No results found for: AFPTUMOR  No results found for: CHROMOGRNA   No results found for: HGBA, HGBA2QUANT, HGBFQUANT, HGBSQUAN (Hemoglobinopathy evaluation)   No results found for: LDH  No results found for: IRON, TIBC, IRONPCTSAT (Iron and TIBC)  No results found for: FERRITIN  Urinalysis    Component Value Date/Time   COLORURINE STRAW (A) 05/09/2019 [place] 05/09/2019 1234   LABSPEC 1.003 (L) 05/09/2019 1234   PHURINE 6.0 05/09/2019 [place] 05/09/2019 [place] 05/09/2019 [place] 05/09/2019 [place] 05/09/2019 [place] 05/09/2019 1234   NITRITE NEGATIVE 05/09/2019 [place] 05/09/2019 1234     STUDIES: No results found.   ELIGIBLE FOR AVAILABLE RESEARCH PROTOCOL: no  ASSESSMENT: 44 y.o. Tyler Whitehead status post left breast biopsy x2 on 12/08/2018, for multicentric invasive lobular breast cancer, grade 2, estrogen and progesterone receptor positive, HER-2 nonamplified, with an MIB-1 of 3%  (a) bilateral breast MRI 12/20/2018 shows multiple masses throughout the left breast, with nipple retraction, but no evidence of chest wall invasion, no abnormal left axillary lymph nodes, and no findings of concern in the right breast  (b) chest CT scan and bone scan  12/29/2018 show only an incidental goiter  (1) status post left skin sparing mastectomy at [facility] 01/16/2019 for an mpT3 pN1, stage IIA invasive lobular carcinoma, grade 2; repeat prognostic panel again estrogen and progesterone receptor strongly positive, HER-2 nonamplified.  (a) a total of 8 lymph nodes removed, one with macrometastasis (8 mm, no ECE)  (2) adjuvant chemotherapy will consist of doxorubicin and cyclophosphamide in dose dense fashion x4 starting 03/13/2019, to be followed by weekly paclitaxel x12  (a) echo 03/09/2019 finds normal left ventricular function at 55-60%  (b) cycle 2 doxorubicin and cyclophosphamide delayed 1 week secondary to nausea problems  (3) adjuvant radiation to follow  (4) antiestrogens to start at the completion of local treatment  (5) genetics testing 01/09/2019 through the [facility] STAT Breast Cancer Panel + Common Hereditary Cancers Panel found no deleterious mutations in ATM, BRCA1, BRCA2, CDH1, CHEK2, PALB2, PTEN, STK11 and TP53, APC, ATM, AXIN2, BARD1, BMPR1A, BRCA1, BRCA2, BRIP1, CDH1, CDKN2A (p14ARF), CDKN2A (p16INK4a), CKD4, CHEK2, CTNNA1, DICER1, EPCAM (Deletion/duplication testing only), GREM1 (promoter region deletion/duplication testing only), KIT, MEN1, MLH1, MSH2, MSH3, MSH6, MUTYH, NBN, NF1, NHTL1, PALB2, PDGFRA, PMS2, POLD1, POLE, PTEN, RAD50, RAD51C, RAD51D SDHB, SDHC, SDHD, SMAD4, SMARCA4. STK11, TP53, TSC1, TSC2, and VHL.  The following genes were evaluated for sequence changes only: SDHA and HOXB13 c.251G>A variant only.   (6) vasomotor symptoms   PLAN: Marnae completes her final cycle of chemotherapy today.   She has worked very hard to get to this, and has done well with managing adverse effects, and getting through her treatments.  Her Paclitaxel was dose reduced due to elevated LFTs, and those have remained normal to slightly elevated since that reduction.    Mikaiah has no further peripheral neuropathy.  Since it has resolved she can  proceed with her treatment today.  She will let us know if it comes back.    Vincenza has thrush, I sent in  some Diflucan to help with this and she will continue magic mouthwash.    Kyira and I discussed her port removal.  She will undergo this next week.  We reviewed the benefits and drawbacks of undergoing the procedure with sedation.  She is considering her options and is going to think about it.    Montoya has met with Dr. Lisbeth Renshaw and will start radiation therapy at the end of this month.  She will see Dr. Jana Hakim mid March after having [facility] and Estradiol drawn to discuss anti estrogen therapy.  She will see me for her Survivorship Care Plan Visit in June of this year.    She was recommended to continue with the appropriate pandemic precautions. She knows to call for any questions that may arise between now and her next appointment.  We are happy to see her sooner if needed.  A total of (20) minutes of face-to-face time was spent with this patient with greater than 50% of that time in counseling and care-coordination.    Wilber Bihari, NP  08/01/19 9:38 AM Medical Oncology and Hematology [facility] [place], [place] [address] Tel. [phone]    Fax. [phone]   I

## 2019-08-08 ENCOUNTER — Other Ambulatory Visit: Payer: Self-pay | Admitting: Physician Assistant

## 2019-08-09 ENCOUNTER — Ambulatory Visit (HOSPITAL_COMMUNITY)
Admission: RE | Admit: 2019-08-09 | Discharge: 2019-08-09 | Disposition: A | Discharge status: Home / Self Care | Payer: 59 | Source: Ambulatory Visit | Attending: Adult Health | Admitting: Adult Health

## 2019-08-09 ENCOUNTER — Other Ambulatory Visit: Payer: Self-pay

## 2019-08-09 ENCOUNTER — Encounter (HOSPITAL_COMMUNITY): Payer: Self-pay

## 2019-08-09 DIAGNOSIS — Z853 Personal history of malignant neoplasm of breast: Secondary | ICD-10-CM | POA: Diagnosis not present

## 2019-08-09 DIAGNOSIS — Z452 Encounter for adjustment and management of vascular access device: Secondary | ICD-10-CM | POA: Diagnosis not present

## 2019-08-09 DIAGNOSIS — C50812 Malignant neoplasm of overlapping sites of left female breast: Secondary | ICD-10-CM

## 2019-08-09 DIAGNOSIS — Z170 Estrogen receptor positive status [ER+]: Secondary | ICD-10-CM

## 2019-08-09 HISTORY — PX: IR REMOVAL TUN ACCESS W/ PORT W/O FL MOD SED: IMG2290

## 2019-08-09 LAB — CBC
HCT: 39.4 % (ref 36.0–46.0)
Hemoglobin: 12.8 g/dL (ref 12.0–15.0)
MCH: 31.1 pg (ref 26.0–34.0)
MCHC: 32.5 g/dL (ref 30.0–36.0)
MCV: 95.6 fL (ref 80.0–100.0)
Platelets: 276 10*3/uL (ref 150–400)
RBC: 4.12 MIL/uL (ref 3.87–5.11)
RDW: 15.2 % (ref 11.5–15.5)
WBC: 5.1 10*3/uL (ref 4.0–10.5)
nRBC: 0 % (ref 0.0–0.2)

## 2019-08-09 LAB — PROTIME-INR
INR: 0.9 (ref 0.8–1.2)
Prothrombin Time: 12.3 seconds (ref 11.4–15.2)

## 2019-08-09 MED ORDER — SODIUM CHLORIDE 0.9 % IV SOLN: INTRAVENOUS | Status: DC

## 2019-08-09 MED ORDER — CEFAZOLIN SODIUM-DEXTROSE 2-4 GM/100ML-% IV SOLN
2.0000 g | Freq: Once | INTRAVENOUS | Status: AC
  Administered 2019-08-09: 2 g via INTRAVENOUS
  Filled 2019-08-09: qty 100

## 2019-08-09 MED ORDER — DIPHENHYDRAMINE HCL 50 MG/ML IJ SOLN
25.0000 mg | Freq: Once | INTRAMUSCULAR | Status: AC
  Administered 2019-08-09: 25 mg via INTRAVENOUS
  Filled 2019-08-09: qty 1

## 2019-08-09 MED ORDER — LIDOCAINE-EPINEPHRINE (PF) 1 %-1:200000 IJ SOLN
INTRAMUSCULAR | Status: DC | PRN
  Administered 2019-08-09: 20 mL

## 2019-08-09 MED ORDER — LIDOCAINE-EPINEPHRINE 1 %-1:100000 IJ SOLN
INTRAMUSCULAR | Status: AC
  Filled 2019-08-09: qty 1

## 2019-08-09 NOTE — Discharge Instructions (Signed)
Urgent needs on call IR MD [phone] Dressing - may remove dressing and shower in 24 hours.  Do not submerge until healed. Keep site clean and dry and replace band aid as necessary Implanted Port Removal, Care After This sheet gives you information about how to care for yourself after your procedure. Your health care provider may also give you more specific instructions. If you have problems or questions, contact your health care provider. What can I expect after the procedure? After the procedure, it is common to have:  Soreness or pain near your incision.  Some swelling or bruising near your incision. Follow these instructions at home: Medicines  Take over-the-counter and prescription medicines only as told by your health care provider.  If you were prescribed an antibiotic medicine, take it as told by your health care provider. Do not stop taking the antibiotic even if you start to feel better. Bathing  Do not take baths, swim, or use a hot tub until your health care provider approves. Ask your health care provider if you can take showers. You may only be allowed to take sponge baths. Incision care  Follow instructions from your health care provider about how to take care of your incision. Make sure you: ? Wash your hands with soap and water before you change your bandage (dressing). If soap and water are not available, use hand sanitizer. ? Change your dressing as told by your health care provider. ? Keep your dressing dry. ? Leave stitches (sutures), skin glue, or adhesive strips in place. These skin closures may need to stay in place for 2 weeks or longer. If adhesive strip edges start to loosen and curl up, you may trim the loose edges. Do not remove adhesive strips completely unless your health care provider tells you to do that.  Check your incision area every day for signs of infection. Check for: ? More redness, swelling, or pain. ? More fluid or blood. ? Warmth. ? Pus or  a bad smell. Driving  Do not drive for 24 hours if you were given a medicine to help you relax (sedative) during your procedure.  If you did not receive a sedative, ask your health care provider when it is safe to drive. Activity  Return to your normal activities as told by your health care provider. Ask your health care provider what activities are safe for you.  Do not lift anything that is heavier than 10 lb (4.5 kg), or the limit that you are told, until your health care provider says that it is safe.  Do not do activities that involve lifting your arms over your head. General instructions  Do not use any products that contain nicotine or tobacco, such as cigarettes and e-cigarettes. These can delay healing. If you need help quitting, ask your health care provider.  Keep all follow-up visits as told by your health care provider. This is important. Contact a health care provider if:  You have more redness, swelling, or pain around your incision.  You have more fluid or blood coming from your incision.  Your incision feels warm to the touch.  You have pus or a bad smell coming from your incision.  You have pain that is not relieved by your pain medicine. Get help right away if you have:  A fever or chills.  Chest pain.  Difficulty breathing. Summary  After the procedure, it is common to have pain, soreness, swelling, or bruising near your incision.  If you were prescribed  an antibiotic medicine, take it as told by your health care provider. Do not stop taking the antibiotic even if you start to feel better.  Do not drive for 24 hours if you were given a sedative during your procedure.  Return to your normal activities as told by your health care provider. Ask your health care provider what activities are safe for you. This information is not intended to replace advice given to you by your health care provider. Make sure you discuss any questions you have with your  health care provider. Document Revised: 08/25/2017 Document Reviewed: 08/25/2017 Elsevier Patient Education  2020 [employer].

## 2019-08-09 NOTE — Procedures (Signed)
Interventional Radiology Procedure:   Indications: History of breast cancer.  Completed therapy.  Procedure: Port removal  Findings: Complete removal of right chest port  Complications: None      EBL: less than 10 ml  Plan: Keep incision dry for 24 hours.    [name] R. Anselm Pancoast, MD  Pager: [phone]

## 2019-08-15 NOTE — Telephone Encounter (Signed)
No entry 

## 2019-08-22 ENCOUNTER — Ambulatory Visit
Admission: RE | Admit: 2019-08-22 | Discharge: 2019-08-22 | Disposition: A | Discharge status: Home / Self Care | Payer: 59 | Source: Ambulatory Visit | Attending: Radiation Oncology | Admitting: Radiation Oncology

## 2019-08-22 ENCOUNTER — Other Ambulatory Visit: Payer: Self-pay

## 2019-08-22 DIAGNOSIS — C50412 Malignant neoplasm of upper-outer quadrant of left female breast: Secondary | ICD-10-CM

## 2019-08-22 DIAGNOSIS — Z170 Estrogen receptor positive status [ER+]: Secondary | ICD-10-CM | POA: Insufficient documentation

## 2019-08-24 DIAGNOSIS — C50412 Malignant neoplasm of upper-outer quadrant of left female breast: Secondary | ICD-10-CM | POA: Diagnosis not present

## 2019-08-24 DIAGNOSIS — Z170 Estrogen receptor positive status [ER+]: Secondary | ICD-10-CM | POA: Diagnosis not present

## 2019-08-29 ENCOUNTER — Ambulatory Visit
Admission: RE | Admit: 2019-08-29 | Discharge: 2019-08-29 | Disposition: A | Discharge status: Home / Self Care | Payer: 59 | Source: Ambulatory Visit | Attending: Radiation Oncology | Admitting: Radiation Oncology

## 2019-08-29 ENCOUNTER — Other Ambulatory Visit: Payer: Self-pay

## 2019-08-29 ENCOUNTER — Encounter: Payer: Self-pay | Admitting: *Deleted

## 2019-08-29 DIAGNOSIS — C50412 Malignant neoplasm of upper-outer quadrant of left female breast: Secondary | ICD-10-CM | POA: Insufficient documentation

## 2019-08-29 DIAGNOSIS — Z79899 Other long term (current) drug therapy: Secondary | ICD-10-CM | POA: Diagnosis not present

## 2019-08-29 DIAGNOSIS — Z510 Encounter for antineoplastic radiation therapy: Secondary | ICD-10-CM | POA: Insufficient documentation

## 2019-08-29 DIAGNOSIS — R21 Rash and other nonspecific skin eruption: Secondary | ICD-10-CM | POA: Diagnosis not present

## 2019-08-29 DIAGNOSIS — Z170 Estrogen receptor positive status [ER+]: Secondary | ICD-10-CM | POA: Insufficient documentation

## 2019-08-29 DIAGNOSIS — C50812 Malignant neoplasm of overlapping sites of left female breast: Secondary | ICD-10-CM | POA: Insufficient documentation

## 2019-08-30 ENCOUNTER — Other Ambulatory Visit: Payer: Self-pay

## 2019-08-30 ENCOUNTER — Ambulatory Visit
Admission: RE | Admit: 2019-08-30 | Discharge: 2019-08-30 | Disposition: A | Discharge status: Home / Self Care | Payer: 59 | Source: Ambulatory Visit | Attending: Radiation Oncology | Admitting: Radiation Oncology

## 2019-08-30 DIAGNOSIS — Z170 Estrogen receptor positive status [ER+]: Secondary | ICD-10-CM | POA: Diagnosis not present

## 2019-08-30 DIAGNOSIS — Z510 Encounter for antineoplastic radiation therapy: Secondary | ICD-10-CM | POA: Diagnosis not present

## 2019-08-30 DIAGNOSIS — R21 Rash and other nonspecific skin eruption: Secondary | ICD-10-CM | POA: Diagnosis not present

## 2019-08-30 DIAGNOSIS — Z79899 Other long term (current) drug therapy: Secondary | ICD-10-CM | POA: Diagnosis not present

## 2019-08-30 DIAGNOSIS — C50412 Malignant neoplasm of upper-outer quadrant of left female breast: Secondary | ICD-10-CM | POA: Diagnosis not present

## 2019-08-31 ENCOUNTER — Ambulatory Visit
Admission: RE | Admit: 2019-08-31 | Discharge: 2019-08-31 | Disposition: A | Discharge status: Home / Self Care | Payer: 59 | Source: Ambulatory Visit | Attending: Radiation Oncology | Admitting: Radiation Oncology

## 2019-08-31 ENCOUNTER — Other Ambulatory Visit: Payer: Self-pay

## 2019-08-31 DIAGNOSIS — Z170 Estrogen receptor positive status [ER+]: Secondary | ICD-10-CM | POA: Diagnosis not present

## 2019-08-31 DIAGNOSIS — Z510 Encounter for antineoplastic radiation therapy: Secondary | ICD-10-CM | POA: Diagnosis not present

## 2019-08-31 DIAGNOSIS — R21 Rash and other nonspecific skin eruption: Secondary | ICD-10-CM | POA: Diagnosis not present

## 2019-08-31 DIAGNOSIS — Z79899 Other long term (current) drug therapy: Secondary | ICD-10-CM | POA: Diagnosis not present

## 2019-08-31 DIAGNOSIS — C50412 Malignant neoplasm of upper-outer quadrant of left female breast: Secondary | ICD-10-CM | POA: Diagnosis not present

## 2019-08-31 DIAGNOSIS — C50812 Malignant neoplasm of overlapping sites of left female breast: Secondary | ICD-10-CM

## 2019-08-31 MED ORDER — ALRA NON-METALLIC DEODORANT (RAD-ONC)
1.0000 "application " | Freq: Once | TOPICAL | Status: AC
  Administered 2019-08-31: 1 via TOPICAL

## 2019-08-31 MED ORDER — RADIAPLEXRX EX GEL: Freq: Once | CUTANEOUS | Status: AC

## 2019-08-31 NOTE — Progress Notes (Signed)
Pt here for patient teaching.  Pt given Radiation and You booklet, skin care instructions, Alra deodorant and Radiaplex gel.  Reviewed areas of pertinence such as fatigue, hair loss, skin changes, breast tenderness and breast swelling . Pt able to give teach back of to pat skin and use unscented/gentle soap,apply Radiaplex bid, avoid applying anything to skin within 4 hours of treatment, avoid wearing an under wire bra and to use an electric razor if they must shave. Pt verbalizes understanding of information given and will contact nursing with any questions or concerns.     Aalyah Mansouri M. Kervin Bones RN, BSN      

## 2019-09-03 ENCOUNTER — Ambulatory Visit
Admission: RE | Admit: 2019-09-03 | Discharge: 2019-09-03 | Disposition: A | Discharge status: Home / Self Care | Payer: 59 | Source: Ambulatory Visit | Attending: Radiation Oncology | Admitting: Radiation Oncology

## 2019-09-03 ENCOUNTER — Other Ambulatory Visit: Payer: Self-pay

## 2019-09-03 DIAGNOSIS — C50412 Malignant neoplasm of upper-outer quadrant of left female breast: Secondary | ICD-10-CM | POA: Diagnosis not present

## 2019-09-03 DIAGNOSIS — Z170 Estrogen receptor positive status [ER+]: Secondary | ICD-10-CM | POA: Diagnosis not present

## 2019-09-03 DIAGNOSIS — R21 Rash and other nonspecific skin eruption: Secondary | ICD-10-CM | POA: Diagnosis not present

## 2019-09-03 DIAGNOSIS — Z510 Encounter for antineoplastic radiation therapy: Secondary | ICD-10-CM | POA: Diagnosis not present

## 2019-09-03 DIAGNOSIS — Z79899 Other long term (current) drug therapy: Secondary | ICD-10-CM | POA: Diagnosis not present

## 2019-09-04 ENCOUNTER — Other Ambulatory Visit: Payer: Self-pay

## 2019-09-04 ENCOUNTER — Ambulatory Visit
Admission: RE | Admit: 2019-09-04 | Discharge: 2019-09-04 | Disposition: A | Discharge status: Home / Self Care | Payer: 59 | Source: Ambulatory Visit | Attending: Radiation Oncology | Admitting: Radiation Oncology

## 2019-09-04 DIAGNOSIS — Z510 Encounter for antineoplastic radiation therapy: Secondary | ICD-10-CM | POA: Diagnosis not present

## 2019-09-04 DIAGNOSIS — Z79899 Other long term (current) drug therapy: Secondary | ICD-10-CM | POA: Diagnosis not present

## 2019-09-04 DIAGNOSIS — C50412 Malignant neoplasm of upper-outer quadrant of left female breast: Secondary | ICD-10-CM | POA: Diagnosis not present

## 2019-09-04 DIAGNOSIS — Z170 Estrogen receptor positive status [ER+]: Secondary | ICD-10-CM | POA: Diagnosis not present

## 2019-09-04 DIAGNOSIS — R21 Rash and other nonspecific skin eruption: Secondary | ICD-10-CM | POA: Diagnosis not present

## 2019-09-05 ENCOUNTER — Ambulatory Visit
Admission: RE | Admit: 2019-09-05 | Discharge: 2019-09-05 | Disposition: A | Discharge status: Home / Self Care | Payer: 59 | Source: Ambulatory Visit | Attending: Radiation Oncology | Admitting: Radiation Oncology

## 2019-09-05 ENCOUNTER — Other Ambulatory Visit: Payer: Self-pay

## 2019-09-05 DIAGNOSIS — Z510 Encounter for antineoplastic radiation therapy: Secondary | ICD-10-CM | POA: Diagnosis not present

## 2019-09-05 DIAGNOSIS — Z170 Estrogen receptor positive status [ER+]: Secondary | ICD-10-CM | POA: Diagnosis not present

## 2019-09-05 DIAGNOSIS — R21 Rash and other nonspecific skin eruption: Secondary | ICD-10-CM | POA: Diagnosis not present

## 2019-09-05 DIAGNOSIS — Z79899 Other long term (current) drug therapy: Secondary | ICD-10-CM | POA: Diagnosis not present

## 2019-09-05 DIAGNOSIS — C50412 Malignant neoplasm of upper-outer quadrant of left female breast: Secondary | ICD-10-CM | POA: Diagnosis not present

## 2019-09-06 ENCOUNTER — Telehealth: Payer: Self-pay | Admitting: Oncology

## 2019-09-06 ENCOUNTER — Ambulatory Visit: Payer: 59 | Attending: Internal Medicine

## 2019-09-06 ENCOUNTER — Other Ambulatory Visit: Payer: Self-pay

## 2019-09-06 DIAGNOSIS — Z23 Encounter for immunization: Secondary | ICD-10-CM | POA: Insufficient documentation

## 2019-09-06 DIAGNOSIS — Z79899 Other long term (current) drug therapy: Secondary | ICD-10-CM | POA: Diagnosis not present

## 2019-09-06 DIAGNOSIS — Z510 Encounter for antineoplastic radiation therapy: Secondary | ICD-10-CM | POA: Diagnosis not present

## 2019-09-06 DIAGNOSIS — R21 Rash and other nonspecific skin eruption: Secondary | ICD-10-CM | POA: Diagnosis not present

## 2019-09-06 DIAGNOSIS — C50412 Malignant neoplasm of upper-outer quadrant of left female breast: Secondary | ICD-10-CM | POA: Diagnosis not present

## 2019-09-06 DIAGNOSIS — Z170 Estrogen receptor positive status [ER+]: Secondary | ICD-10-CM | POA: Diagnosis not present

## 2019-09-06 NOTE — Telephone Encounter (Signed)
Faxed genetic report to [facility] [phone]

## 2019-09-06 NOTE — Progress Notes (Signed)
   Covid-19 Vaccination Clinic  Name:  Tonya Whitehead    MRN: [id number] DOB: Nov 28, 1975  09/06/2019  [name] was observed post Covid-19 immunization for 15 minutes without incidence. She was provided with Vaccine Information Sheet and instruction to access the V-Safe system.   [name] was instructed to call 911 with any severe reactions post vaccine: Marland Kitchen Difficulty breathing  . Swelling of your face and throat  . A fast heartbeat  . A bad rash all over your body  . Dizziness and weakness    Immunizations Administered    Name Date Dose VIS Date Route   Pfizer COVID-19 Vaccine 09/06/2019 10:53 AM 0.3 mL 07/06/2019 Intramuscular   Manufacturer: [place]   Lot: [id number]   [place]ent: [id number]

## 2019-09-07 DIAGNOSIS — C50412 Malignant neoplasm of upper-outer quadrant of left female breast: Secondary | ICD-10-CM | POA: Diagnosis not present

## 2019-09-07 DIAGNOSIS — Z170 Estrogen receptor positive status [ER+]: Secondary | ICD-10-CM | POA: Diagnosis not present

## 2019-09-07 DIAGNOSIS — R21 Rash and other nonspecific skin eruption: Secondary | ICD-10-CM | POA: Diagnosis not present

## 2019-09-07 DIAGNOSIS — Z510 Encounter for antineoplastic radiation therapy: Secondary | ICD-10-CM | POA: Diagnosis not present

## 2019-09-07 DIAGNOSIS — Z79899 Other long term (current) drug therapy: Secondary | ICD-10-CM | POA: Diagnosis not present

## 2019-09-10 ENCOUNTER — Ambulatory Visit
Admission: RE | Admit: 2019-09-10 | Discharge: 2019-09-10 | Disposition: A | Discharge status: Home / Self Care | Payer: 59 | Source: Ambulatory Visit | Attending: Radiation Oncology | Admitting: Radiation Oncology

## 2019-09-10 ENCOUNTER — Other Ambulatory Visit: Payer: Self-pay

## 2019-09-10 DIAGNOSIS — R21 Rash and other nonspecific skin eruption: Secondary | ICD-10-CM | POA: Diagnosis not present

## 2019-09-10 DIAGNOSIS — Z510 Encounter for antineoplastic radiation therapy: Secondary | ICD-10-CM | POA: Diagnosis not present

## 2019-09-10 DIAGNOSIS — Z79899 Other long term (current) drug therapy: Secondary | ICD-10-CM | POA: Diagnosis not present

## 2019-09-10 DIAGNOSIS — C50412 Malignant neoplasm of upper-outer quadrant of left female breast: Secondary | ICD-10-CM | POA: Diagnosis not present

## 2019-09-10 DIAGNOSIS — Z170 Estrogen receptor positive status [ER+]: Secondary | ICD-10-CM | POA: Diagnosis not present

## 2019-09-11 ENCOUNTER — Ambulatory Visit
Admission: RE | Admit: 2019-09-11 | Discharge: 2019-09-11 | Disposition: A | Discharge status: Home / Self Care | Payer: 59 | Source: Ambulatory Visit | Attending: Radiation Oncology | Admitting: Radiation Oncology

## 2019-09-11 ENCOUNTER — Other Ambulatory Visit: Payer: Self-pay

## 2019-09-11 ENCOUNTER — Inpatient Hospital Stay: Payer: 59 | Attending: Medical | Admitting: Medical

## 2019-09-11 ENCOUNTER — Telehealth: Payer: Self-pay | Admitting: *Deleted

## 2019-09-11 VITALS — BP 113/66 | HR 66 | Temp 97.9°F | Resp 18 | Ht 64.0 in | Wt 150.5 lb

## 2019-09-11 DIAGNOSIS — Z510 Encounter for antineoplastic radiation therapy: Secondary | ICD-10-CM | POA: Diagnosis not present

## 2019-09-11 DIAGNOSIS — C50412 Malignant neoplasm of upper-outer quadrant of left female breast: Secondary | ICD-10-CM | POA: Diagnosis not present

## 2019-09-11 DIAGNOSIS — Z79899 Other long term (current) drug therapy: Secondary | ICD-10-CM | POA: Diagnosis not present

## 2019-09-11 DIAGNOSIS — C50812 Malignant neoplasm of overlapping sites of left female breast: Secondary | ICD-10-CM

## 2019-09-11 DIAGNOSIS — R21 Rash and other nonspecific skin eruption: Secondary | ICD-10-CM | POA: Diagnosis not present

## 2019-09-11 DIAGNOSIS — Z170 Estrogen receptor positive status [ER+]: Secondary | ICD-10-CM | POA: Diagnosis not present

## 2019-09-11 MED ORDER — FLUCONAZOLE 100 MG PO TABS: 100.0000 mg | ORAL_TABLET | Freq: Every day | ORAL | 0 refills | Status: DC

## 2019-09-11 NOTE — Telephone Encounter (Signed)
Pt sent email with picture and c/o red,raised rash around her mouth. Noted that if she "scratches it" it becomes worse. Per [place] NP, pt should be seen by [facility]. Msg sent to [facility] with picture attached. Pt confirmed appt time for 2:30pm today for [facility].

## 2019-09-11 NOTE — Patient Instructions (Signed)

## 2019-09-12 ENCOUNTER — Ambulatory Visit
Admission: RE | Admit: 2019-09-12 | Discharge: 2019-09-12 | Disposition: A | Discharge status: Home / Self Care | Payer: 59 | Source: Ambulatory Visit | Attending: Radiation Oncology | Admitting: Radiation Oncology

## 2019-09-12 ENCOUNTER — Other Ambulatory Visit: Payer: Self-pay

## 2019-09-12 DIAGNOSIS — Z510 Encounter for antineoplastic radiation therapy: Secondary | ICD-10-CM | POA: Diagnosis not present

## 2019-09-12 DIAGNOSIS — R21 Rash and other nonspecific skin eruption: Secondary | ICD-10-CM | POA: Diagnosis not present

## 2019-09-12 DIAGNOSIS — Z6825 Body mass index (BMI) 25.0-25.9, adult: Secondary | ICD-10-CM | POA: Diagnosis not present

## 2019-09-12 DIAGNOSIS — Z01419 Encounter for gynecological examination (general) (routine) without abnormal findings: Secondary | ICD-10-CM | POA: Diagnosis not present

## 2019-09-12 DIAGNOSIS — C50412 Malignant neoplasm of upper-outer quadrant of left female breast: Secondary | ICD-10-CM | POA: Diagnosis not present

## 2019-09-12 DIAGNOSIS — Z170 Estrogen receptor positive status [ER+]: Secondary | ICD-10-CM | POA: Diagnosis not present

## 2019-09-12 DIAGNOSIS — Z79899 Other long term (current) drug therapy: Secondary | ICD-10-CM | POA: Diagnosis not present

## 2019-09-12 NOTE — Progress Notes (Signed)
[facility]pt[facility] Progress Note   Tonya Whitehead [id number] 07/12/1976 44 y.o.  Tonya Whitehead is managed by Dr. Lurline Del  Actively treated with chemotherapy/immunotherapy/hormonal therapy: yes  Last treated: 08/01/2019 with cycle 12 of paclitaxel.  She is currently receiving radiation therapy.   Next scheduled appointment with provider: 10/03/2019  Assessment: Plan:    Rash of face  Malignant neoplasm of overlapping sites of left breast in female, estrogen receptor positive ([place])   Perioral rash: Patient was given a prescription for Diflucan 100 mg p.o. once daily and was told to continue using topical antifungal cream.  ER positive malignant neoplasm of the left breast: The patient continues to be followed by Dr. Jana Hakim and is status post cycle 12 of paclitaxel which was dosed on 08/01/2019.  She is scheduled to return on 10/03/2019.  Please see After Visit Summary for patient specific instructions.  Future Appointments  Date Time Provider Crystal  09/24/2019 10:00 AM [facility] LAB 2 [facility] None  09/28/2019  8:30 AM [facility] LINAC 3 [facility] None  09/28/2019  8:45 AM Kyung Rudd, MD [facility] None  09/29/2019 10:45 AM [place] [facility] PEC  10/03/2019 10:30 AM Magrinat, Virgie Dad, MD [facility] None  10/03/2019 11:30 AM Lafonda Mosses, MD [facility] None  10/05/2019  8:30 AM [facility] LINAC 3 [facility] None  10/05/2019  8:40 AM Kyung Rudd, MD [facility] None  10/09/2019  8:30 AM [facility] LINAC 3 [facility] None  10/10/2019  8:30 AM [facility] LINAC 3 [facility] None  10/11/2019  8:30 AM [facility] LINAC 3 [facility] None  10/12/2019  8:30 AM [facility] LINAC 3 [facility] None  10/15/2019  8:30 AM [facility] LINAC 3 [facility] None  01/16/2020 10:30 AM Causey, Charlestine Massed, NP [facility] None    No orders of the defined types were placed in this encounter.      Subjective:   Patient ID:   Tonya Whitehead is a 44 y.o. (DOB 02-09-1976) female.  Chief Complaint:  Chief Complaint  Patient presents with  . Rash    HPI [name]  Is a 44 y.o. female with a diagnosis of an ER positive malignant neoplasm of the left breast. She continues to be followed by Dr. Jana Hakim and is status post cycle 12 of paclitaxel which was dosed on 08/01/2019.  She presents to the clinic today for ongoing perioral, pruritic, and erythematous slightly scaling rash mostly over her lower lip and bilateral labial folds.  She was given an antifungal medication in December for thrush and has been dealing with this rash around her mouth afterwards.  She is used hydrocortisone cream, antifungal cream, and has taken Benadryl without improvement.  She denies any fevers, chills, sweats, chest pain, shortness of breath, or other issues of concern is currently undergoing radiation therapy and reports that she is tolerating this well so far.  Medications: I have reviewed the patient's current medications.  Allergies:  Allergies  Allergen Reactions  . Gluten Meal Hives    Joint pain, upset stomach  . Sulfa Antibiotics     Unknown reaction " I was told I had a reaction when I was a baby "    Past Medical History:  Diagnosis Date  . 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 Family history of bladder cancer   . Family history of colon cancer   . Family history of lung cancer   . Family history of ovarian cancer     Past Surgical History:  Procedure Laterality Date  .  ADENOIDECTOMY     as a child  . BREAST EXCISIONAL BIOPSY Left 2002   reconstructive surgery on nipple to match right breast  . IR IMAGING GUIDED PORT INSERTION  03/08/2019  . IR REMOVAL TUN ACCESS W/ PORT W/O FL MOD SED  08/09/2019    Family History  Problem Relation Age of Onset  . Lung cancer Mother 46       smoker  . Ovarian cancer Mother 35  . Colon cancer Maternal Aunt        29s  . Bladder Cancer Paternal  Grandfather        death was unrelated  . Cancer Paternal Aunt        through [facility], blood cancer    Social History   Socioeconomic History  . Marital status: Married    Spouse name: [name]  . Number of children: 3  . Years of education: Not on file  . Highest education level: Not on file  Occupational History  . Not on file  Tobacco Use  . Smoking status: Never Smoker  . Smokeless tobacco: Never Used  Substance and Sexual Activity  . Alcohol use: Not on file  . Drug use: Never  . Sexual activity: Yes    Birth control/protection: Other-see comments    Comment: husband had vasectomy  Other Topics Concern  . Not on file  Social History Narrative  . Not on file   Social Determinants of Health   Financial Resource Strain:   . Difficulty of Paying Living Expenses: Not on file  Food Insecurity:   . Worried About Charity fundraiser in the Last Year: Not on file  . Ran Out of Food in the Last Year: Not on file  Transportation Needs:   . Lack of Transportation (Medical): Not on file  . Lack of Transportation (Non-Medical): Not on file  Physical Activity:   . Days of Exercise per Week: Not on file  . Minutes of Exercise per Session: Not on file  Stress:   . Feeling of Stress : Not on file  Social Connections:   . Frequency of Communication with Friends and Family: Not on file  . Frequency of Social Gatherings with Friends and Family: Not on file  . Attends Religious Services: Not on file  . Active Member of Clubs or Organizations: Not on file  . Attends Archivist Meetings: Not on file  . Marital Status: Not on file  Intimate Partner Violence:   . Fear of Current or Ex-Partner: Not on file  . Emotionally Abused: Not on file  . Physically Abused: Not on file  . Sexually Abused: Not on file    Past Medical History, Surgical history, Social history, and Family history were reviewed and updated as appropriate.   Please see review of systems for further details on  the patient's review from today.   Review of Systems:  Review of Systems  Constitutional: Negative for appetite change, chills, diaphoresis and fever.  HENT: Negative for mouth sores and trouble swallowing.   Skin: Positive for rash.    Objective:   Physical Exam:  BP 113/66 (BP Location: Right Arm, Patient Position: Sitting)   Pulse 66   Temp 97.9 F (36.6 C) (Temporal)   Resp 18   Ht 5\' 4"  (1.626 m)   Wt 150 lb 8 oz (68.3 kg)   SpO2 100%   BMI 25.83 kg/m  ECOG: 0  Physical Exam Constitutional:      General: She  is not in acute distress.    Appearance: Normal appearance. She is obese. She is not ill-appearing.  HENT:     Head: Normocephalic and atraumatic.     Mouth/Throat:   Skin:    General: Skin is warm and dry.     Findings: Rash present.  Neurological:     Mental Status: She is alert.     Coordination: Coordination normal.     Gait: Gait normal.  Psychiatric:        Mood and Affect: Mood normal.        Behavior: Behavior normal.        Thought Content: Thought content normal.        Judgment: Judgment normal.     Lab Review:     Component Value Date/Time   NA 139 08/01/2019 0900   K 4.3 08/01/2019 0900   CL 105 08/01/2019 0900   CO2 25 08/01/2019 0900   GLUCOSE 111 (H) 08/01/2019 0900   BUN 14 08/01/2019 0900   CREATININE 0.68 08/01/2019 0900   CREATININE 0.85 03/20/2019 1022   CALCIUM 9.0 08/01/2019 0900   PROT 6.8 08/01/2019 0900   ALBUMIN 4.0 08/01/2019 0900   AST 25 08/01/2019 0900   AST 13 (L) 03/20/2019 1022   ALT 50 (H) 08/01/2019 0900   ALT 40 03/20/2019 1022   ALKPHOS 63 08/01/2019 0900   BILITOT 0.4 08/01/2019 0900   BILITOT 0.7 03/20/2019 1022   GFRNONAA >60 08/01/2019 0900   GFRNONAA >60 03/20/2019 1022   GFRAA >60 08/01/2019 0900   GFRAA >60 03/20/2019 1022       Component Value Date/Time   WBC 5.1 08/09/2019 0830   RBC 4.12 08/09/2019 0830   HGB 12.8 08/09/2019 0830   HGB 13.0 03/20/2019 1022   HCT 39.4 08/09/2019  0830   PLT 276 08/09/2019 0830   PLT 55 (L) 03/20/2019 1022   MCV 95.6 08/09/2019 0830   MCH 31.1 08/09/2019 0830   MCHC 32.5 08/09/2019 0830   RDW 15.2 08/09/2019 0830   LYMPHSABS 0.6 (L) 08/01/2019 0900   MONOABS 0.4 08/01/2019 0900   EOSABS 0.1 08/01/2019 0900   BASOSABS 0.0 08/01/2019 0900   -------------------------------  Imaging from last 24 hours (if applicable):  Radiology interpretation: No results found.

## 2019-09-13 ENCOUNTER — Ambulatory Visit: Admission: RE | Admit: 2019-09-13 | Payer: 59 | Source: Ambulatory Visit

## 2019-09-13 DIAGNOSIS — C50412 Malignant neoplasm of upper-outer quadrant of left female breast: Secondary | ICD-10-CM | POA: Diagnosis not present

## 2019-09-13 DIAGNOSIS — Z510 Encounter for antineoplastic radiation therapy: Secondary | ICD-10-CM | POA: Diagnosis not present

## 2019-09-13 DIAGNOSIS — Z79899 Other long term (current) drug therapy: Secondary | ICD-10-CM | POA: Diagnosis not present

## 2019-09-13 DIAGNOSIS — R21 Rash and other nonspecific skin eruption: Secondary | ICD-10-CM | POA: Diagnosis not present

## 2019-09-13 DIAGNOSIS — Z170 Estrogen receptor positive status [ER+]: Secondary | ICD-10-CM | POA: Diagnosis not present

## 2019-09-14 ENCOUNTER — Ambulatory Visit: Admission: RE | Admit: 2019-09-14 | Payer: 59 | Source: Ambulatory Visit

## 2019-09-14 DIAGNOSIS — R21 Rash and other nonspecific skin eruption: Secondary | ICD-10-CM | POA: Diagnosis not present

## 2019-09-14 DIAGNOSIS — Z79899 Other long term (current) drug therapy: Secondary | ICD-10-CM | POA: Diagnosis not present

## 2019-09-14 DIAGNOSIS — Z170 Estrogen receptor positive status [ER+]: Secondary | ICD-10-CM | POA: Diagnosis not present

## 2019-09-14 DIAGNOSIS — C50412 Malignant neoplasm of upper-outer quadrant of left female breast: Secondary | ICD-10-CM | POA: Diagnosis not present

## 2019-09-14 DIAGNOSIS — Z510 Encounter for antineoplastic radiation therapy: Secondary | ICD-10-CM | POA: Diagnosis not present

## 2019-09-17 ENCOUNTER — Ambulatory Visit
Admission: RE | Admit: 2019-09-17 | Discharge: 2019-09-17 | Disposition: A | Discharge status: Home / Self Care | Payer: 59 | Source: Ambulatory Visit | Attending: Radiation Oncology | Admitting: Radiation Oncology

## 2019-09-17 DIAGNOSIS — C50412 Malignant neoplasm of upper-outer quadrant of left female breast: Secondary | ICD-10-CM | POA: Diagnosis not present

## 2019-09-17 DIAGNOSIS — Z79899 Other long term (current) drug therapy: Secondary | ICD-10-CM | POA: Diagnosis not present

## 2019-09-17 DIAGNOSIS — Z510 Encounter for antineoplastic radiation therapy: Secondary | ICD-10-CM | POA: Diagnosis not present

## 2019-09-17 DIAGNOSIS — R21 Rash and other nonspecific skin eruption: Secondary | ICD-10-CM | POA: Diagnosis not present

## 2019-09-17 DIAGNOSIS — Z170 Estrogen receptor positive status [ER+]: Secondary | ICD-10-CM | POA: Diagnosis not present

## 2019-09-18 ENCOUNTER — Ambulatory Visit
Admission: RE | Admit: 2019-09-18 | Discharge: 2019-09-18 | Disposition: A | Discharge status: Home / Self Care | Payer: 59 | Source: Ambulatory Visit | Attending: Radiation Oncology | Admitting: Radiation Oncology

## 2019-09-18 ENCOUNTER — Other Ambulatory Visit: Payer: Self-pay

## 2019-09-18 DIAGNOSIS — Z170 Estrogen receptor positive status [ER+]: Secondary | ICD-10-CM | POA: Diagnosis not present

## 2019-09-18 DIAGNOSIS — C50412 Malignant neoplasm of upper-outer quadrant of left female breast: Secondary | ICD-10-CM | POA: Diagnosis not present

## 2019-09-18 DIAGNOSIS — R21 Rash and other nonspecific skin eruption: Secondary | ICD-10-CM | POA: Diagnosis not present

## 2019-09-18 DIAGNOSIS — Z510 Encounter for antineoplastic radiation therapy: Secondary | ICD-10-CM | POA: Diagnosis not present

## 2019-09-18 DIAGNOSIS — Z79899 Other long term (current) drug therapy: Secondary | ICD-10-CM | POA: Diagnosis not present

## 2019-09-19 ENCOUNTER — Ambulatory Visit
Admission: RE | Admit: 2019-09-19 | Discharge: 2019-09-19 | Disposition: A | Discharge status: Home / Self Care | Payer: 59 | Source: Ambulatory Visit | Attending: Radiation Oncology | Admitting: Radiation Oncology

## 2019-09-19 ENCOUNTER — Other Ambulatory Visit: Payer: Self-pay

## 2019-09-19 DIAGNOSIS — Z510 Encounter for antineoplastic radiation therapy: Secondary | ICD-10-CM | POA: Diagnosis not present

## 2019-09-19 DIAGNOSIS — C50412 Malignant neoplasm of upper-outer quadrant of left female breast: Secondary | ICD-10-CM | POA: Diagnosis not present

## 2019-09-19 DIAGNOSIS — Z170 Estrogen receptor positive status [ER+]: Secondary | ICD-10-CM | POA: Diagnosis not present

## 2019-09-19 DIAGNOSIS — R21 Rash and other nonspecific skin eruption: Secondary | ICD-10-CM | POA: Diagnosis not present

## 2019-09-19 DIAGNOSIS — Z79899 Other long term (current) drug therapy: Secondary | ICD-10-CM | POA: Diagnosis not present

## 2019-09-20 ENCOUNTER — Ambulatory Visit
Admission: RE | Admit: 2019-09-20 | Discharge: 2019-09-20 | Disposition: A | Discharge status: Home / Self Care | Payer: 59 | Source: Ambulatory Visit | Attending: Radiation Oncology | Admitting: Radiation Oncology

## 2019-09-20 DIAGNOSIS — R21 Rash and other nonspecific skin eruption: Secondary | ICD-10-CM | POA: Diagnosis not present

## 2019-09-20 DIAGNOSIS — Z170 Estrogen receptor positive status [ER+]: Secondary | ICD-10-CM | POA: Diagnosis not present

## 2019-09-20 DIAGNOSIS — C50412 Malignant neoplasm of upper-outer quadrant of left female breast: Secondary | ICD-10-CM | POA: Diagnosis not present

## 2019-09-20 DIAGNOSIS — Z510 Encounter for antineoplastic radiation therapy: Secondary | ICD-10-CM | POA: Diagnosis not present

## 2019-09-20 DIAGNOSIS — Z79899 Other long term (current) drug therapy: Secondary | ICD-10-CM | POA: Diagnosis not present

## 2019-09-21 ENCOUNTER — Ambulatory Visit
Admission: RE | Admit: 2019-09-21 | Discharge: 2019-09-21 | Disposition: A | Discharge status: Home / Self Care | Payer: 59 | Source: Ambulatory Visit | Attending: Radiation Oncology | Admitting: Radiation Oncology

## 2019-09-21 ENCOUNTER — Other Ambulatory Visit: Payer: Self-pay

## 2019-09-21 ENCOUNTER — Other Ambulatory Visit: Payer: Self-pay | Admitting: *Deleted

## 2019-09-21 DIAGNOSIS — R21 Rash and other nonspecific skin eruption: Secondary | ICD-10-CM | POA: Diagnosis not present

## 2019-09-21 DIAGNOSIS — Z170 Estrogen receptor positive status [ER+]: Secondary | ICD-10-CM | POA: Diagnosis not present

## 2019-09-21 DIAGNOSIS — C50412 Malignant neoplasm of upper-outer quadrant of left female breast: Secondary | ICD-10-CM | POA: Diagnosis not present

## 2019-09-21 DIAGNOSIS — Z510 Encounter for antineoplastic radiation therapy: Secondary | ICD-10-CM | POA: Diagnosis not present

## 2019-09-21 DIAGNOSIS — Z79899 Other long term (current) drug therapy: Secondary | ICD-10-CM | POA: Diagnosis not present

## 2019-09-21 MED ORDER — FLUCONAZOLE 100 MG PO TABS: 100.0000 mg | ORAL_TABLET | Freq: Every day | ORAL | 0 refills | Status: DC

## 2019-09-24 ENCOUNTER — Other Ambulatory Visit: Payer: 59

## 2019-09-24 ENCOUNTER — Ambulatory Visit
Admission: RE | Admit: 2019-09-24 | Discharge: 2019-09-24 | Disposition: A | Discharge status: Home / Self Care | Payer: 59 | Source: Ambulatory Visit | Attending: Radiation Oncology | Admitting: Radiation Oncology

## 2019-09-24 ENCOUNTER — Other Ambulatory Visit: Payer: Self-pay

## 2019-09-24 DIAGNOSIS — C50812 Malignant neoplasm of overlapping sites of left female breast: Secondary | ICD-10-CM | POA: Insufficient documentation

## 2019-09-24 DIAGNOSIS — Z170 Estrogen receptor positive status [ER+]: Secondary | ICD-10-CM | POA: Insufficient documentation

## 2019-09-24 DIAGNOSIS — C50412 Malignant neoplasm of upper-outer quadrant of left female breast: Secondary | ICD-10-CM | POA: Insufficient documentation

## 2019-09-24 DIAGNOSIS — Z510 Encounter for antineoplastic radiation therapy: Secondary | ICD-10-CM | POA: Diagnosis not present

## 2019-09-25 ENCOUNTER — Other Ambulatory Visit: Payer: Self-pay

## 2019-09-25 ENCOUNTER — Ambulatory Visit
Admission: RE | Admit: 2019-09-25 | Discharge: 2019-09-25 | Disposition: A | Discharge status: Home / Self Care | Payer: 59 | Source: Ambulatory Visit | Attending: Radiation Oncology | Admitting: Radiation Oncology

## 2019-09-25 DIAGNOSIS — C50412 Malignant neoplasm of upper-outer quadrant of left female breast: Secondary | ICD-10-CM | POA: Diagnosis not present

## 2019-09-25 DIAGNOSIS — Z510 Encounter for antineoplastic radiation therapy: Secondary | ICD-10-CM | POA: Diagnosis not present

## 2019-09-25 DIAGNOSIS — Z170 Estrogen receptor positive status [ER+]: Secondary | ICD-10-CM | POA: Diagnosis not present

## 2019-09-26 ENCOUNTER — Other Ambulatory Visit: Payer: Self-pay

## 2019-09-26 ENCOUNTER — Ambulatory Visit
Admission: RE | Admit: 2019-09-26 | Discharge: 2019-09-26 | Disposition: A | Discharge status: Home / Self Care | Payer: 59 | Source: Ambulatory Visit | Attending: Radiation Oncology | Admitting: Radiation Oncology

## 2019-09-26 DIAGNOSIS — Z510 Encounter for antineoplastic radiation therapy: Secondary | ICD-10-CM | POA: Diagnosis not present

## 2019-09-26 DIAGNOSIS — C50412 Malignant neoplasm of upper-outer quadrant of left female breast: Secondary | ICD-10-CM | POA: Diagnosis not present

## 2019-09-26 DIAGNOSIS — Z170 Estrogen receptor positive status [ER+]: Secondary | ICD-10-CM | POA: Diagnosis not present

## 2019-09-27 ENCOUNTER — Other Ambulatory Visit: Payer: Self-pay

## 2019-09-27 ENCOUNTER — Ambulatory Visit
Admission: RE | Admit: 2019-09-27 | Discharge: 2019-09-27 | Disposition: A | Discharge status: Home / Self Care | Payer: 59 | Source: Ambulatory Visit | Attending: Radiation Oncology | Admitting: Radiation Oncology

## 2019-09-27 DIAGNOSIS — Z510 Encounter for antineoplastic radiation therapy: Secondary | ICD-10-CM | POA: Diagnosis not present

## 2019-09-27 DIAGNOSIS — C50812 Malignant neoplasm of overlapping sites of left female breast: Secondary | ICD-10-CM

## 2019-09-27 DIAGNOSIS — Z170 Estrogen receptor positive status [ER+]: Secondary | ICD-10-CM | POA: Diagnosis not present

## 2019-09-27 DIAGNOSIS — C50412 Malignant neoplasm of upper-outer quadrant of left female breast: Secondary | ICD-10-CM | POA: Diagnosis not present

## 2019-09-27 MED ORDER — SONAFINE EX EMUL: 1.0000 "application " | Freq: Two times a day (BID) | CUTANEOUS | Status: DC

## 2019-09-27 MED ORDER — SONAFINE EX EMUL
1.0000 "application " | Freq: Two times a day (BID) | CUTANEOUS | Status: DC
  Administered 2019-09-27: 1 via TOPICAL

## 2019-09-28 ENCOUNTER — Ambulatory Visit
Admission: RE | Admit: 2019-09-28 | Discharge: 2019-09-28 | Disposition: A | Discharge status: Home / Self Care | Payer: 59 | Source: Ambulatory Visit | Attending: Radiation Oncology | Admitting: Radiation Oncology

## 2019-09-28 ENCOUNTER — Other Ambulatory Visit: Payer: Self-pay

## 2019-09-28 DIAGNOSIS — Z510 Encounter for antineoplastic radiation therapy: Secondary | ICD-10-CM | POA: Diagnosis not present

## 2019-09-28 DIAGNOSIS — C50412 Malignant neoplasm of upper-outer quadrant of left female breast: Secondary | ICD-10-CM | POA: Diagnosis not present

## 2019-09-28 DIAGNOSIS — Z170 Estrogen receptor positive status [ER+]: Secondary | ICD-10-CM | POA: Diagnosis not present

## 2019-09-28 DIAGNOSIS — C50812 Malignant neoplasm of overlapping sites of left female breast: Secondary | ICD-10-CM

## 2019-09-29 ENCOUNTER — Ambulatory Visit: Payer: 59 | Attending: Internal Medicine

## 2019-09-29 DIAGNOSIS — Z23 Encounter for immunization: Secondary | ICD-10-CM | POA: Insufficient documentation

## 2019-09-29 NOTE — Progress Notes (Signed)
   Covid-19 Vaccination Clinic  Name:  Tonya Whitehead    MRN: [id number] DOB: 1976-01-30  09/29/2019  [name] was observed post Covid-19 immunization for 15 minutes without incident. She was provided with Vaccine Information Sheet and instruction to access the V-Safe system.   [name] was instructed to call 911 with any severe reactions post vaccine: Marland Kitchen Difficulty breathing  . Swelling of face and throat  . A fast heartbeat  . A bad rash all over body  . Dizziness and weakness   Immunizations Administered    Name Date Dose VIS Date Route   Pfizer COVID-19 Vaccine 09/29/2019 10:46 AM 0.3 mL 07/06/2019 Intramuscular   Manufacturer: [place]   Lot: [id number]   [place]: [id number]

## 2019-09-29 NOTE — Progress Notes (Signed)
  Radiation Oncology         [phone] ________________________________  Name: Tonya Whitehead MRN: [id number]  Date: 08/22/2019  DOB: [birth year]  Diagnosis DIAGNOSIS:     ICD-10-CM   1. Malignant neoplasm of upper-outer quadrant of left breast in female, estrogen receptor positive ([place])  C50.412    Z17.0      SIMULATION AND TREATMENT PLANNING NOTE  The patient presented for simulation prior to beginning her course of radiation treatment for her diagnosis of left-sided breast cancer. The patient was placed in a supine position on a breast board. A customized vac-lock bag was also constructed and this complex treatment device will be used on a daily basis during her treatment. In this fashion, a CT scan was obtained through the chest area and an isocenter was placed near the chest wall at the upper aspect of the right chest. A breath-hold technique has also been evaluated to determine if this significantly improves the spatial relationship between the target region and the heart. Based on this analysis, a breath-hold technique has been ordered for the patient's treatment.  The patient will be planned to receive a course of radiation initially to a dose of 50.4 gray. This will consist of a 4 field technique targeting the left chest wall as well as the supraclavicular region. Therefore 2 customized medial and lateral tangent fields have been created targeting the chest wall, and also 2 additional customized fields have been designed to treat the supraclavicular region both with a left supraclavicular field and a left posterior axillary boost field. A forward planning/reduced field technique will also be evaluated to determine if this significantly improves the dose homogeneity of the overall plan. Therefore, additional customized blocks/fields may be necessary.  This initial treatment will be accomplished at 1.8 gray per fraction.   The initial plan will consist of a 3-D conformal technique. The  target volume/scar, heart and lungs have been contoured and dose volume histograms of each of these structures will be evaluated as part of the 3-D conformal treatment planning process.   It is anticipated that the patient will then receive a 10 gray boost to the surgical scar. This will be accomplished at 2 gray per fraction. The final anticipated total dose therefore will correspond to 60.4 gray.    _______________________________   Jodelle Gross, MD, PhD

## 2019-09-29 NOTE — Progress Notes (Signed)
  Radiation Oncology         [phone] ________________________________  Name: Tonya Whitehead MRN: [id number]  Date: 08/22/2019  DOB: 09-06-1975  Optical Surface Tracking Plan:  Since intensity modulated radiotherapy (IMRT) and 3D conformal radiation treatment methods are predicated on accurate and precise positioning for treatment, intrafraction motion monitoring is medically necessary to ensure accurate and safe treatment delivery.  The ability to quantify intrafraction motion without excessive ionizing radiation dose can only be performed with optical surface tracking. Accordingly, surface imaging offers the opportunity to obtain 3D measurements of patient position throughout IMRT and 3D treatments without excessive radiation exposure.  I am ordering optical surface tracking for this patient's upcoming course of radiotherapy. ________________________________  Kyung Rudd, MD 09/29/2019 3:23 PM    Reference:   Particia Jasper, et al. Surface imaging-based analysis of intrafraction motion for breast radiotherapy patients.Journal of [place], n. 6, nov. 2014. ISSN [id number].   Available at: <http://www.jacmp.org/index.php/jacmp/article/view/4957>.

## 2019-09-30 NOTE — Progress Notes (Signed)
GYNECOLOGIC ONCOLOGY NEW PATIENT CONSULTATION   Patient Name: Tonya Whitehead  Patient Age: 44 y.o. Date of Service: 3/10 Referring Provider: Hayden Rasmussen, MD [address] [place],  [place] [address]   Primary Care Provider: Marton Redwood, MD Consulting Provider: Jeral Pinch, MD   Assessment/Plan:  44 year old with estrogen receptor positive breast cancer finishing adjuvant treatment and about to start antiestrogen therapy here to discuss therapeutic BSO in the setting of her personal history as well as first-degree relative with ovarian cancer.  I had a long discussion with Ms. Orwick regarding the rationale for therapeutic removal of the ovaries. She has an estrogen receptor positive breast cancer and her medical oncologist is considering ablation of ovarian function.  The two largest trials (SOFT and TEXT) found a prolonged disease-free survival benefit in "higher risk" patients when ovulatory suppression was added to standard endocrine therapy (e.g. tamoxifen, AI).       I discussed that ovarian ablation can be permanent (oophorectomy) or reversible (Lupron).  While there is limited high-quality evidence, some have proposed that oophorectomy be gold standard ablative therapy.  Patient is aware that oophorectomy (or long-term Lupron use) could increase her risk for osteoporosis, cardiovascular disease, hot flashes, and depression/anxiety.   We discussed that oophorectomy would make her menopausal and a candidate for aromatase inhibitor therapy (rather than SERM).  Her medical oncologist spoke with her today about the use of tamoxifen.  I have encouraged her to reach out and ask whether he would favor the use of AI therapy after BSO.  Additionally, we also discussed the pros and cons of removing the uterus. This is optional, but there is about a three-fold increased risk of uterine cancer in women with breast cancer who are treated with Tamoxifen.  The patient voiced  understanding of this risk.  At this time, even if she is placed on tamoxifen for antiestrogen therapy, her preference would be to proceed with BSO only.  Plan will be for robotic assisted bilateral salpingo-oophorectomy on March 30. The risks of surgery were discussed in detail and she understands these to include infection; wound separation; hernia; injury to adjacent organs such as bowel, bladder, blood vessels, ureters and nerves; bleeding which may require blood transfusion; anesthesia risk; thromboembolic events; possible death; unforeseen complications; possible need for re-exploration; medical complications such as heart attack, stroke, pleural effusion and pneumonia. The patient will receive DVT and antibiotic prophylaxis as indicated. She voiced a clear understanding. She had the opportunity to ask questions. Perioperative instructions were reviewed with her. Prescriptions for post-op medications were sent to her pharmacy of choice.  A copy of this note was sent to the patient's referring provider.   45 minutes of total time was spent for this patient encounter, including preparation, face-to-face counseling with the patient and coordination of care, and documentation of the encounter.   Jeral Pinch, MD  Division of Gynecologic Oncology  Department of Obstetrics and Gynecology  [facility]  ___________________________________________  Chief Complaint: Chief Complaint  Patient presents with  . Adnexal mass    New patient    History of Present Illness:  Tonya Whitehead is a 44 y.o. y.o. female who is seen in consultation at the request of Dr. Jana Hakim for an evaluation of therapeutic BSO in the setting of estrogen scepter positive breast cancer.  Her cancer history is noted below. Briefly, she was diagnosed with stage IB ER/PR+ breast cancer. S/p surgery in 12/2018 and adjuvant chemotherapy completed in 07/2019. She  is now unergoing adjuvant radiation  with plan for anti-estrogen therapy at completion of treatment.  She finishes radiation on 3/22.  In terms of symptoms related to cancer treatment, she endorses continued fatigue, hot flashes and feeling thirsty.  She is using gabapentin now which has helped significantly with her sleep.  Genetic testing in 12/2018 was negative.    Plan is for [facility] and estradiol testing this month to help guide anti-estrogen therapy.  Patient's last menstrual period was with her first cycle of chemotherapy.  She denies any vaginal bleeding or discharge since then.  She used OCPs in the past for less than a year.  Her family history is notable for multiple members diagnosed with cancers.  Her mother was diagnosed with ovarian cancer at age 58.  She was diagnosed with stage III disease and is now 4 years out from diagnosis.  Treatment History: Oncology History  Malignant neoplasm of overlapping sites of left breast in female, estrogen receptor positive ([place])  12/13/2018 Initial Diagnosis   Malignant neoplasm of overlapping sites of left breast in female, estrogen receptor positive ([place])   01/16/2019 Cancer Staging   Staging form: Breast, AJCC 8th Edition - Pathologic stage from 01/16/2019: Stage IB (pT3, pN1a, cM0, G2, ER+, PR+, HER2-) - Signed by Gardenia Phlegm, NP on 05/09/2019   03/13/2019 -  Chemotherapy   The patient had DOXOrubicin (ADRIAMYCIN) chemo injection 104 mg, 60 mg/m2 = 104 mg, Intravenous,  Once, 4 of 4 cycles Administration: 104 mg (03/13/2019), 104 mg (03/27/2019), 104 mg (04/10/2019), 104 mg (04/24/2019) palonosetron (ALOXI) injection 0.25 mg, 0.25 mg, Intravenous,  Once, 16 of 16 cycles Administration: 0.25 mg (03/13/2019), 0.25 mg (03/27/2019), 0.25 mg (04/10/2019), 0.25 mg (04/24/2019), 0.25 mg (05/09/2019), 0.25 mg (05/23/2019), 0.25 mg (05/30/2019), 0.25 mg (06/06/2019), 0.25 mg (06/13/2019), 0.25 mg (06/20/2019), 0.25 mg (06/27/2019), 0.25 mg (07/04/2019), 0.25 mg (07/11/2019), 0.25 mg  (07/18/2019), 0.25 mg (07/25/2019), 0.25 mg (08/01/2019) pegfilgrastim (NEULASTA ONPRO KIT) injection 6 mg, 6 mg, Subcutaneous, Once, 4 of 4 cycles Administration: 6 mg (03/13/2019), 6 mg (03/27/2019), 6 mg (04/10/2019), 6 mg (04/24/2019) cyclophosphamide (CYTOXAN) 1,040 mg in sodium chloride 0.9 % 250 mL chemo infusion, 600 mg/m2 = 1,040 mg, Intravenous,  Once, 4 of 4 cycles Administration: 1,040 mg (03/13/2019), 1,040 mg (03/27/2019), 1,040 mg (04/10/2019), 1,040 mg (04/24/2019) PACLitaxel (TAXOL) 138 mg in sodium chloride 0.9 % 250 mL chemo infusion (</= 51m/m2), 80 mg/m2 = 138 mg, Intravenous,  Once, 12 of 12 cycles Dose modification: 60 mg/m2 (original dose 80 mg/m2, Cycle 8, Reason: Provider Judgment), 72 mg/m2 (original dose 72 mg/m2, Cycle 7) Administration: 138 mg (05/09/2019), 102 mg (05/30/2019), 102 mg (06/06/2019), 102 mg (06/13/2019), 102 mg (06/20/2019), 102 mg (06/27/2019), 102 mg (07/04/2019), 102 mg (07/11/2019), 102 mg (07/18/2019), 102 mg (07/25/2019), 102 mg (08/01/2019) fosaprepitant (EMEND) 150 mg, dexamethasone (DECADRON) 12 mg in sodium chloride 0.9 % 145 mL IVPB, , Intravenous,  Once, 4 of 4 cycles Administration:  (03/13/2019),  (03/27/2019),  (04/10/2019),  (04/24/2019)  for chemotherapy treatment.      PAST MEDICAL HISTORY:  Past Medical History:  Diagnosis Date  . Breast cancer ([facility] 12/08/2018   invasive lobular  . Family history of bladder cancer   . Family history of colon cancer   . Family history of lung cancer   . Family history of ovarian cancer      PAST SURGICAL HISTORY:  Past Surgical History:  Procedure Laterality Date  . ADENOIDECTOMY     as a child  .  BREAST EXCISIONAL BIOPSY Left 2002   reconstructive surgery on nipple to match right breast  . IR IMAGING GUIDED PORT INSERTION  03/08/2019  . IR REMOVAL TUN ACCESS W/ PORT W/O FL MOD SED  08/09/2019    OB/GYN HISTORY:  OB History  Gravida Para Term Preterm AB Living  3 3          SAB TAB Ectopic Multiple Live  Births               # Outcome Date GA Lbr Len/2nd Weight Sex Delivery Anes PTL Lv  3 Para           2 Para           1 Para             No LMP recorded. (Menstrual status: Perimenopausal).  Age at menarche: 55  Age at menopause: 54 Hx of HRT: denies Hx of STDs: no Last pap: 2021 History of abnormal pap smears: no  SCREENING STUDIES:  Last mammogram: 2020  Last colonoscopy: n/a  MEDICATIONS: Outpatient Encounter Medications as of 10/03/2019  Medication Sig  . gabapentin (NEURONTIN) 300 MG capsule Take 1 capsule (300 mg total) by mouth at bedtime.  Marland Kitchen ibuprofen (ADVIL) 800 MG tablet Take 1 tablet (800 mg total) by mouth every 8 (eight) hours as needed for moderate pain. For AFTER surgery  . oxyCODONE (OXY IR/ROXICODONE) 5 MG immediate release tablet Take 1 tablet (5 mg total) by mouth every 4 (four) hours as needed for severe pain. For AFTER surgery, do not take and drive  . senna-docusate (SENOKOT-S) 8.6-50 MG tablet Take 2 tablets by mouth at bedtime. For AFTER surgery, do not take if having diarrhea  . [DISCONTINUED] fluconazole (DIFLUCAN) 100 MG tablet Take 1 tablet (100 mg total) by mouth daily. (Patient not taking: Reported on 10/03/2019)  . [DISCONTINUED] lidocaine-prilocaine (EMLA) cream Apply to affected area once (Patient not taking: Reported on 10/03/2019)   No facility-administered encounter medications on file as of 10/03/2019.    ALLERGIES:  Allergies  Allergen Reactions  . Gluten Meal Hives    Joint pain, upset stomach  . Sulfa Antibiotics     Unknown reaction " I was told I had a reaction when I was a baby "     FAMILY HISTORY:  Family History  Problem Relation Age of Onset  . Lung cancer Mother 15       smoker  . Ovarian cancer Mother 32  . Colon cancer Maternal Aunt        59s  . Bladder Cancer Paternal Grandfather        death was unrelated  . Cancer Paternal Aunt        through [place], blood cancer     SOCIAL HISTORY:    Social Connections:   .  Frequency of Communication with Friends and Family: Not on file  . Frequency of Social Gatherings with Friends and Family: Not on file  . Attends Religious Services: Not on file  . Active Member of Clubs or Organizations: Not on file  . Attends Archivist Meetings: Not on file  . Marital Status: Not on file    REVIEW OF SYSTEMS:  Pertinent positives as per HPI. Denies appetite changes, fevers, chills, unexplained weight changes. Denies hearing loss, neck lumps or masses, mouth sores, ringing in ears or voice changes. Denies cough or wheezing.  Denies shortness of breath. Denies chest pain or palpitations. Denies leg swelling. Denies abdominal distention, pain,  blood in stools, constipation, diarrhea, nausea, vomiting, or early satiety. Denies pain with intercourse, dysuria, frequency, hematuria or incontinence. Denies pelvic pain, vaginal bleeding or vaginal discharge.   Denies joint pain, back pain or muscle pain/cramps. Denies itching, rash, or wounds. Denies dizziness, headaches, numbness or seizures. Denies swollen lymph nodes or glands, denies easy bruising or bleeding. Denies anxiety, depression, confusion, or decreased concentration.  Physical Exam:  Vital Signs for this encounter:  Blood pressure 105/68, pulse 79, respiratory rate 20, oxygen saturation 100% on room air, temperature 96.8 C, weight 65.2 kg, height 1.63 m. General: Alert, oriented, no acute distress.  HEENT: Normocephalic, atraumatic. Sclera anicteric.  Chest: Clear to auscultation bilaterally. No wheezes, rhonchi, or rales. Cardiovascular: Regular rate and rhythm, no murmurs, rubs, or gallops.  Abdomen: Normoactive bowel sounds. Soft, nondistended, nontender to palpation. No masses or hepatosplenomegaly appreciated. No palpable fluid wave.  Extremities: Grossly normal range of motion. Warm, well perfused. No edema bilaterally.  Skin: No rashes or lesions.  Lymphatics: No cervical, supraclavicular, or  inguinal adenopathy.  GU:  Normal external female genitalia. No lesions. No discharge or bleeding.             Bladder/urethra:  No lesions or masses, well supported bladder             Vagina: Well rugated vaginal mucosa, no lesions or masses.             Cervix: Normal appearing, no lesions, quite posterior.             Uterus: Small, mobile, no parametrial involvement or nodularity.  Very anteverted.             Adnexa: No masses appreciated.  Rectal: Deferred.  LABORATORY AND RADIOLOGIC DATA:   Lab Results  Component Value Date   WBC 5.1 08/09/2019   HGB 12.8 08/09/2019   HCT 39.4 08/09/2019   PLT 276 08/09/2019   GLUCOSE 111 (H) 08/01/2019   ALT 50 (H) 08/01/2019   AST 25 08/01/2019   NA 139 08/01/2019   K 4.3 08/01/2019   CL 105 08/01/2019   CREATININE 0.68 08/01/2019   BUN 14 08/01/2019   CO2 25 08/01/2019   INR 0.9 08/09/2019

## 2019-10-01 ENCOUNTER — Other Ambulatory Visit: Payer: Self-pay

## 2019-10-01 ENCOUNTER — Ambulatory Visit
Admission: RE | Admit: 2019-10-01 | Discharge: 2019-10-01 | Disposition: A | Discharge status: Home / Self Care | Payer: 59 | Source: Ambulatory Visit | Attending: Radiation Oncology | Admitting: Radiation Oncology

## 2019-10-01 DIAGNOSIS — C50412 Malignant neoplasm of upper-outer quadrant of left female breast: Secondary | ICD-10-CM | POA: Diagnosis not present

## 2019-10-01 DIAGNOSIS — Z510 Encounter for antineoplastic radiation therapy: Secondary | ICD-10-CM | POA: Diagnosis not present

## 2019-10-01 DIAGNOSIS — Z170 Estrogen receptor positive status [ER+]: Secondary | ICD-10-CM | POA: Diagnosis not present

## 2019-10-02 ENCOUNTER — Other Ambulatory Visit: Payer: Self-pay

## 2019-10-02 ENCOUNTER — Ambulatory Visit
Admission: RE | Admit: 2019-10-02 | Discharge: 2019-10-02 | Disposition: A | Discharge status: Home / Self Care | Payer: 59 | Source: Ambulatory Visit | Attending: Radiation Oncology | Admitting: Radiation Oncology

## 2019-10-02 DIAGNOSIS — C50412 Malignant neoplasm of upper-outer quadrant of left female breast: Secondary | ICD-10-CM | POA: Diagnosis not present

## 2019-10-02 DIAGNOSIS — Z510 Encounter for antineoplastic radiation therapy: Secondary | ICD-10-CM | POA: Diagnosis not present

## 2019-10-02 DIAGNOSIS — Z170 Estrogen receptor positive status [ER+]: Secondary | ICD-10-CM | POA: Diagnosis not present

## 2019-10-02 NOTE — Progress Notes (Signed)
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   Since her last visit, she underwent port removal on 08/09/2019.  She tolerated this with no complications.  She also had her Pap smear under Dr. Renold Whitehead with benign results.  She was evaluated by Dr. Lisbeth Whitehead for radiation therapy. She began treatment on 08/30/2019 and is scheduled to finish on 10/15/2019.  She is tolerating treatment well, with a little bit of skin breakdown in the left armpit.  Her energy is good and she walks for exercise on a regular basis.   REVIEW OF SYSTEMS: Tonya Whitehead's hair has come back frosty curly and thick.  There are no bald patches.  The gabapentin at bedtime is significantly helping with the nighttime hot flashes although she has some dry mouth and increased water drinking which worries her and is probably not related to the medication.  Hot flashes are not an issue during the day.  She has mild vaginal dryness issues.  She is working full-time.  She is still thinking about D IEP reconstruction and at this point is not interested in just having a prosthesis to put under her bra on the left.  She is also considering  bilateral salpingo-oophorectomy and will meet with Dr. Berline Whitehead today     HISTORY OF CURRENT ILLNESS: From the original intake note:  Tonya Whitehead notes a history of reconstructive left breast surgery in 2002 while living in [place], [place]. That was for symmetry. The left breast accordingly had some scar tissue and she thought that was what she was noting until mid March, when she felt the left breast was changing more and the nipple seemed to be sinking subtly.  She brought this to Dr Tonya Whitehead attention and underwent bilateral diagnostic mammography with tomography and bilateral breast ultrasonography at [facility] on 12/01/2018 showing: breast density category C; findings concerning for extensive malignancy throughout the left breast predominately involving the upper- and lower-outer quadrants.  The mass was palpable within the upper outer left breast and there was left nipple retraction as well as indentation along the inferior margin of the left breast.  Targeted ultrasound showed three masses measuring 5.6 cm (2 o'clock position), 4.0 cm (close to the nipple and contiguous to the prior mass), and 3.3 cm (6 o'clock position.).  Also noted was a borderline left axillary lymph node adjacent to the left axillary artery (and so not easy to biopsy).  In the right breast was noted a 1.3 cm cyst.  On 12/08/2018 Tonya Whitehead proceeded to biopsy of 2 of the left breast areas in question. The pathology from this procedure ([id number]) showed: invasive mammary carcinoma, grade 2, in two cores (at 2 o'clock and 6 o'clock) with identical histomorphology; e-cadherin is negative, consistent with a lobular phenotype. Prognostic indicators significant for: estrogen receptor, 70% positive and  progesterone receptor, 70% positive, both with strong staining intensity. Proliferation marker Ki67 at 3%. HER2 negative by immunohistochemistry (1+).  Note also the patient had a thyroid ultrasound 07/18/2018 showing bilateral  solitary nodules, which were biopsied 08/09/2018.  Both showed follicular nodules, Bethesda category 2 (benign).  The patient's subsequent history is as detailed below.   PAST MEDICAL HISTORY: Past Medical History:  Diagnosis Date  . Breast cancer ([place]) 12/08/2018   invasive lobular  . Family history of bladder cancer   . Family history of colon cancer   . Family history of lung cancer   . Family history of ovarian cancer     PAST SURGICAL HISTORY: Past Surgical History:  Procedure Laterality Date  . ADENOIDECTOMY     as a child  . BREAST EXCISIONAL BIOPSY Left 2002   reconstructive surgery on nipple to match right breast  . IR IMAGING GUIDED PORT INSERTION  03/08/2019  . IR REMOVAL TUN ACCESS W/ PORT W/O FL MOD SED  08/09/2019  Adenoidectomy as a child.   FAMILY HISTORY: Family History  Problem Relation Age of Onset  . Lung cancer Mother 7       smoker  . Ovarian cancer Mother 77  . Colon cancer Maternal Aunt        64s  . Bladder Cancer Paternal Grandfather        death was unrelated  . Cancer Paternal Aunt        through [facility], blood cancer   As of May 2020 patient's father is alive at age 50. Patient's mother is also living at age 6. She has ovarian cancer, diagnosed in 2019, and lung cancer (stage IV), diagnosed in 2016. She tested negative for BRCA.  A maternal aunt had colon cancer in her 23s.  3 other siblings of the patient's mother have not had any cancer.  On the paternal side the paternal grandmother had bladder cancer.  A paternal great aunt died from cancer of the pancreas.     GYNECOLOGIC HISTORY:  Last menstrual period August 2020  menarche: 44 years old Age at first live birth: 44 years old GX P 3 LMP every month, 5 total days with 2 heavy Contraceptive no, husband with vasectomy. HRT n/a  Hysterectomy? no BSO? 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 Husband Marden Whitehead is a physical therapist. He  is biologically [place] and was adopted by a [place] family.  The patient lives at home with her husband and their 3 children: Tonya Whitehead is 6, Tonya Whitehead is 2, and  [name], 75.  The patient attends a [employer].    ADVANCED DIRECTIVES: Husband Marden Whitehead is her HCPOA   HEALTH MAINTENANCE: Social History   Tobacco Use  . Smoking status: Never Smoker  . Smokeless tobacco: Never Used  Substance Use Topics  . Alcohol use: Not on file  . Drug use: Never     Colonoscopy: never done  PAP: not on file  Bone density: never done   Allergies  Allergen Reactions  . Gluten Meal Hives    Joint pain, upset stomach  . Sulfa Antibiotics     Unknown reaction " I was told I had a reaction when I was a baby "    Current Outpatient Medications  Medication Sig Dispense Refill  . gabapentin (NEURONTIN) 300 MG capsule Take 1 capsule (300 mg total) by mouth at bedtime. 90 capsule 4  . ibuprofen (ADVIL) 800 MG tablet Take 1  tablet (800 mg total) by mouth every 8 (eight) hours as needed for moderate pain. For AFTER surgery 30 tablet 0  . oxyCODONE (OXY IR/ROXICODONE) 5 MG immediate release tablet Take 1 tablet (5 mg total) by mouth every 4 (four) hours as needed for severe pain. For AFTER surgery, do not take and drive 15 tablet 0  . senna-docusate (SENOKOT-S) 8.6-50 MG tablet Take 2 tablets by mouth at bedtime. For AFTER surgery, do not take if having diarrhea 30 tablet 0   No current facility-administered medications for this visit.    OBJECTIVE: Young white woman who appears well  Vitals:   10/03/19 1015  BP: 105/68  Pulse: 79  Resp: 20  Temp: 98.3 F (36.8 C)  SpO2: 100%   Wt Readings from Last 3 Encounters:  10/03/19 143 lb 11.2 oz (65.2 kg)  09/11/19 150 lb 8 oz (68.3 kg)  08/01/19 145 lb 12.8 oz (66.1 kg)   Body mass index is 24.67 kg/m.    ECOG FS:1 - Symptomatic but completely ambulatory  Sclerae unicteric, EOMs intact Wearing a mask No cervical or supraclavicular  adenopathy Lungs no rales or rhonchi Heart regular rate and rhythm Abd soft, nontender, positive bowel sounds MSK no focal spinal tenderness, no upper extremity lymphedema Neuro: nonfocal, well oriented, appropriate affect Breasts: The right breast is unremarkable.  The left breast is status post lumpectomy and is currently receiving radiation.  There is erythema over the port area and axilla there is dry desquamation.  There is no evidence of local recurrence.  The overall cosmetic result is excellent.  Both axillae are benign.   LAB RESULTS:  CMP     Component Value Date/Time   NA 139 08/01/2019 0900   K 4.3 08/01/2019 0900   CL 105 08/01/2019 0900   CO2 25 08/01/2019 0900   GLUCOSE 111 (H) 08/01/2019 0900   BUN 14 08/01/2019 0900   CREATININE 0.68 08/01/2019 0900   CREATININE 0.85 03/20/2019 1022   CALCIUM 9.0 08/01/2019 0900   PROT 6.8 08/01/2019 0900   ALBUMIN 4.0 08/01/2019 0900   AST 25 08/01/2019 0900   AST 13 (L) 03/20/2019 1022   ALT 50 (H) 08/01/2019 0900   ALT 40 03/20/2019 1022   ALKPHOS 63 08/01/2019 0900   BILITOT 0.4 08/01/2019 0900   BILITOT 0.7 03/20/2019 1022   GFRNONAA >60 08/01/2019 0900   GFRNONAA >60 03/20/2019 1022   GFRAA >60 08/01/2019 0900   GFRAA >60 03/20/2019 1022    No results found for: TOTALPROTELP, ALBUMINELP, A1GS, A2GS, BETS, BETA2SER, GAMS, MSPIKE, SPEI  No results found for: KPAFRELGTCHN, LAMBDASER, KAPLAMBRATIO  Lab Results  Component Value Date   WBC 5.1 08/09/2019   NEUTROABS 3.3 08/01/2019   HGB 12.8 08/09/2019   HCT 39.4 08/09/2019   MCV 95.6 08/09/2019   PLT 276 08/09/2019    No results found for: LABCA2  No components found for: OIZTIW580  No results for input(s): INR in the last 168 hours.  No results found for: LABCA2  No results found for: DXI338  No results found for: SNK539  No results found for: JQB341  No results found for: CA2729  No components found for: HGQUANT  No results found for: CEA1 / No  results found for: CEA1   No results found for: AFPTUMOR  No results found for: CHROMOGRNA   No results found for: HGBA, HGBA2QUANT, HGBFQUANT, HGBSQUAN (Hemoglobinopathy evaluation)   No results found for: LDH  No results found for: IRON, TIBC, IRONPCTSAT (Iron  and TIBC)  No results found for: FERRITIN  Urinalysis    Component Value Date/Time   COLORURINE STRAW (A) 05/09/2019 [place] 05/09/2019 1234   LABSPEC 1.003 (L) 05/09/2019 1234   PHURINE 6.0 05/09/2019 1234   GLUCOSEU NEGATIVE 05/09/2019 1234   HGBUR NEGATIVE 05/09/2019 [place] 05/09/2019 1234   KETONESUR NEGATIVE 05/09/2019 1234   PROTEINUR NEGATIVE 05/09/2019 1234   NITRITE NEGATIVE 05/09/2019 1234   LEUKOCYTESUR NEGATIVE 05/09/2019 1234    STUDIES: No results found.   ELIGIBLE FOR AVAILABLE RESEARCH PROTOCOL: no  ASSESSMENT: 44 y.o. Tonya Whitehead woman status post left breast biopsy x2 on 12/08/2018, for multicentric invasive lobular breast cancer, grade 2, estrogen and progesterone receptor positive, HER-2 nonamplified, with an MIB-1 of 3%  (a) bilateral breast MRI 12/20/2018 shows multiple masses throughout the left breast, with nipple retraction, but no evidence of chest wall invasion, no abnormal left axillary lymph nodes, and no findings of concern in the right breast  (b) chest CT scan and bone scan 12/29/2018 show only an incidental goiter  (1) status post left skin sparing maste[facility] 01/16/2019 for an mpT3 pN1, stage IIA invasive lobular carcinoma, grade 2; repeat prognostic panel again estrogen and progesterone receptor strongly positive, HER-2 nonamplified.  (a) a total of 8 lymph nodes removed, one with macrometastasis (8 mm, no ECE)  (2) adjuvant chemotherapy will consist of doxorubicin and cyclophosphamide in dose dense fashion x4 starting 03/13/2019, to be followed by weekly paclitaxel x12  (a) echo 03/09/2019 finds normal left ventricular function at  55-60%  (b) cycle 2 doxorubicin and cyclophosphamide delayed 1 week secondary to nausea problems  (3) adjuvant radiation to follow  (4) antiestrogens to start at the completion of local treatment  (5) genetics testing 01/09/2019 through the [facility] STAT Breast Cancer Panel + Common Hereditary Cancers Panel found no deleterious mutations in ATM, BRCA1, BRCA2, CDH1, CHEK2, PALB2, PTEN, STK11 and TP53, APC, ATM, AXIN2, BARD1, BMPR1A, BRCA1, BRCA2, BRIP1, CDH1, CDKN2A (p14ARF), CDKN2A (p16INK4a), CKD4, CHEK2, CTNNA1, DICER1, EPCAM (Deletion/duplication testing only), GREM1 (promoter region deletion/duplication testing only), KIT, MEN1, MLH1, MSH2, MSH3, MSH6, MUTYH, NBN, NF1, NHTL1, PALB2, PDGFRA, PMS2, POLD1, POLE, PTEN, RAD50, RAD51C, RAD51D SDHB, SDHC, SDHD, SMAD4, SMARCA4. STK11, TP53, TSC1, TSC2, and VHL.  The following genes were evaluated for sequence changes only: SDHA and HOXB13 c.251G>A variant only.   (6) vasomotor symptoms   PLAN: [place] will soon complete her adjuvant radiation.  Even though she is having some dry desquamation nevertheless I think she is doing remarkably well and certainly as she could have had much worse side effects and complications from these treatments.  I expect within 10 days of completing the radiation treatments her skin will have largely healed over.  She understands the fatigue associated with radiation persists for a while and that is why I do not like to start antiestrogens immediately after her radiation is completed.  We ended up choosing May 3 as her first day for antiestrogens.  We discussed the difference between tamoxifen and the aromatase inhibitors.  In brief anastrozole and the aromatase inhibitors in general work by blocking estrogen production. Accordingly vaginal dryness, decrease in bone density, and of course hot flashes can result. The aromatase inhibitors can also negatively affect the cholesterol profile, although that is a minor effect. One  out of 5 women on aromatase inhibitors we will feel "old and achy". This arthralgia/myalgia syndrome, which resembles fibromyalgia clinically, does resolve with stopping the medications. Accordingly  this is not a reason to not try an aromatase inhibitor but it is a frequent reason to stop it (in other words 20% of women will not be able to tolerate these medications).  Tamoxifen on the other hand does not block estrogen production. It does not "take away a woman's estrogen". It blocks the estrogen receptor in breast cells. Like anastrozole, it can also cause hot flashes. As opposed to anastrozole, tamoxifen has many estrogen-like effects. It is technically an estrogen receptor modulator. This means that in some tissues tamoxifen works like estrogen-- for example it helps strengthen the bones. It tends to improve the cholesterol profile. It can cause thickening of the endometrial lining, and even endometrial polyps or rarely cancer of the uterus.(The risk of uterine cancer due to tamoxifen is one additional cancer per thousand women year). It can cause vaginal wetness or stickiness. It can cause blood clots through this estrogen-like effect--the risk of blood clots with tamoxifen is exactly the same as with birth control pills or hormone replacement.  Neither of these agents causes mood changes or weight gain, despite the popular belief that they can have these side effects. We have data from studies comparing either of these drugs with placebo, and in those cases the control group had the same amount of weight gain and depression as the group that took the drug.  For younger women I much prefer tamoxifen since this allows them to use a vaginal estrogens which otherwise would be contraindicated (with anastrozole).  This is not a major issue at present but she will find out the cost difference through her insurance between vaginal creams, Vagifem suppositories, and Estring and let me know if she wishes to  receive any of those  Otherwise the plan is to start tamoxifen May 3 as discussed and she will see me again late July.  She knows to call for any other issue that may develop before that visit.  Total encounter time 30 minutes.Sarajane Jews C. Ellery Meroney, MD 10/03/19 12:08 PM Medical Oncology and Hematology [facility] [place], [place] [address] Tel. [phone]    Fax. [phone]   I, Wilburn Mylar, am acting as scribe for Dr. Virgie Whitehead. Diyari Cherne.  I, Lurline Del MD, have reviewed the above documentation for accuracy and completeness, and I agree with the above.    *Total Encounter Time as defined by the Centers for Medicare and Medicaid Services includes, in addition to the face-to-face time of a patient visit (documented in the note above) non-face-to-face time: obtaining and reviewing outside history, ordering and reviewing medications, tests or procedures, care coordination (communications with other health care professionals or caregivers) and documentation in the medical record.

## 2019-10-03 ENCOUNTER — Other Ambulatory Visit: Payer: Self-pay

## 2019-10-03 ENCOUNTER — Encounter: Payer: Self-pay | Admitting: Gynecologic Oncology

## 2019-10-03 ENCOUNTER — Ambulatory Visit
Admission: RE | Admit: 2019-10-03 | Discharge: 2019-10-03 | Disposition: A | Discharge status: Home / Self Care | Payer: 59 | Source: Ambulatory Visit | Attending: Radiation Oncology | Admitting: Radiation Oncology

## 2019-10-03 ENCOUNTER — Inpatient Hospital Stay: Payer: 59 | Attending: Medical | Admitting: Oncology

## 2019-10-03 ENCOUNTER — Other Ambulatory Visit: Payer: 59

## 2019-10-03 ENCOUNTER — Inpatient Hospital Stay (HOSPITAL_BASED_OUTPATIENT_CLINIC_OR_DEPARTMENT_OTHER): Payer: 59 | Admitting: Gynecologic Oncology

## 2019-10-03 ENCOUNTER — Telehealth: Payer: Self-pay | Admitting: Oncology

## 2019-10-03 VITALS — BP 105/68 | HR 79 | Temp 98.3°F | Resp 20 | Ht 64.0 in | Wt 143.7 lb

## 2019-10-03 DIAGNOSIS — Z170 Estrogen receptor positive status [ER+]: Secondary | ICD-10-CM | POA: Insufficient documentation

## 2019-10-03 DIAGNOSIS — C50812 Malignant neoplasm of overlapping sites of left female breast: Secondary | ICD-10-CM

## 2019-10-03 DIAGNOSIS — Z510 Encounter for antineoplastic radiation therapy: Secondary | ICD-10-CM | POA: Insufficient documentation

## 2019-10-03 DIAGNOSIS — Z8041 Family history of malignant neoplasm of ovary: Secondary | ICD-10-CM

## 2019-10-03 DIAGNOSIS — C50412 Malignant neoplasm of upper-outer quadrant of left female breast: Secondary | ICD-10-CM | POA: Diagnosis not present

## 2019-10-03 DIAGNOSIS — Z9012 Acquired absence of left breast and nipple: Secondary | ICD-10-CM | POA: Diagnosis not present

## 2019-10-03 MED ORDER — TAMOXIFEN CITRATE 20 MG PO TABS: 20.0000 mg | ORAL_TABLET | Freq: Every day | ORAL | 4 refills | Status: AC

## 2019-10-03 MED ORDER — IBUPROFEN 800 MG PO TABS: 800.0000 mg | ORAL_TABLET | Freq: Three times a day (TID) | ORAL | 0 refills | Status: DC | PRN

## 2019-10-03 MED ORDER — SENNOSIDES-DOCUSATE SODIUM 8.6-50 MG PO TABS: 2.0000 | ORAL_TABLET | Freq: Every day | ORAL | 0 refills | Status: DC

## 2019-10-03 MED ORDER — OXYCODONE HCL 5 MG PO TABS: 5.0000 mg | ORAL_TABLET | ORAL | 0 refills | Status: DC | PRN

## 2019-10-03 NOTE — Patient Instructions (Signed)
Preparing for your Surgery  Plan for surgery on March 30 with Dr. Jeral Pinch at [place] will be scheduled for a robotic assisted laparoscopic bilateral salpingo-oophorectomy.   Pre-operative Testing -You will receive a phone call from presurgical testing at [facility] to arrange for a pre-op appointment with the nurse over the phone, lab appointment, and COVID testing.  -Bring your insurance card, copy of an advanced directive if applicable, medication list  -At that visit, you will be asked to sign a consent for a possible blood transfusion in case a transfusion becomes necessary during surgery.  The need for a blood transfusion is rare but having consent is a necessary part of your care.     -You should not be taking blood thinners or aspirin at least ten days prior to surgery unless instructed by your surgeon.  -Do not take supplements such as fish oil (omega 3), red yeast rice, tumeric before your surgery.   Day Before Surgery at [facility] will be asked to take in a light diet the day before surgery.  Avoid carbonated beverages.  You will be advised to have nothing to eat or drink after midnight the evening before.    Eat a light diet the day before surgery.  Examples including soups, broths, toast, yogurt, mashed potatoes.  Things to avoid include carbonated beverages (fizzy beverages), raw fruits and raw vegetables, or beans.   If your bowels are filled with gas, your surgeon will have difficulty visualizing your pelvic organs which increases your surgical risks.  Your role in recovery Your role is to become active as soon as directed by your doctor, while still giving yourself time to heal.  Rest when you feel tired. You will be asked to do the following in order to speed your recovery:  - Cough and breathe deeply. This helps to clear and expand your lungs and can prevent pneumonia after surgery.  - Tonya Whitehead. Do mild physical  activity. Walking or moving your legs help your circulation and body functions return to normal. Do not try to get up or walk alone the first time after surgery.   -If you develop swelling on one leg or the other, pain in the back of your leg, redness/warmth in one of your legs, please call the office or go to the Emergency Room to have a doppler to rule out a blood clot. For shortness of breath, chest pain-seek care in the Emergency Room as soon as possible. - Actively manage your pain. Managing your pain lets you move in comfort. We will ask you to rate your pain on a scale of zero to 10. It is your responsibility to tell your doctor or nurse where and how much you hurt so your pain can be treated.  Special Considerations -Your final pathology results from surgery should be available around one week after surgery and the results will be relayed to you when available.  -Dr. Lahoma Crocker is the surgeon that assists your GYN Oncologist with surgery.  If you end up staying the night, the next day after your surgery you will either see Dr. Denman George, Dr. Berline Lopes, or Dr. Lahoma Crocker.  -FMLA forms can be faxed to [phone] and please allow 5-7 business days for completion.  Pain Management After Surgery -You have been prescribed your pain medication and bowel regimen medications before surgery so that you can have these available when you are discharged from the hospital. The pain medication  is for use ONLY AFTER surgery and a new prescription will not be given.   -Make sure that you have Tylenol and Ibuprofen at home to use on a regular basis after surgery for pain control. We recommend alternating the medications every hour to six hours since they work differently and are processed in the body differently for pain relief.  -Review the attached handout on narcotic use and their risks and side effects.   Bowel Regimen -You have been prescribed Sennakot-S to take nightly to prevent  constipation especially if you are taking the narcotic pain medication intermittently.  It is important to prevent constipation and drink adequate amounts of liquids. You can stop taking this medication when you are not taking pain medication and you are back on your normal bowel routine.   Blood Transfusion Information (For the consent to be signed before surgery)  We will be checking your blood type before surgery so in case of emergencies, we will know what type of blood you would need.                                            WHAT IS A BLOOD TRANSFUSION?  A transfusion is the replacement of blood or some of its parts. Blood is made up of multiple cells which provide different functions.  Red blood cells carry oxygen and are used for blood loss replacement.  White blood cells fight against infection.  Platelets control bleeding.  Plasma helps clot blood.  Other blood products are available for specialized needs, such as hemophilia or other clotting disorders. BEFORE THE TRANSFUSION  Who gives blood for transfusions?   You may be able to donate blood to be used at a later date on yourself (autologous donation).  Relatives can be asked to donate blood. This is generally not any safer than if you have received blood from a stranger. The same precautions are taken to ensure safety when a relative's blood is donated.  Healthy volunteers who are fully evaluated to make sure their blood is safe. This is blood bank blood. Transfusion therapy is the safest it has ever been in the practice of medicine. Before blood is taken from a donor, a complete history is taken to make sure that person has no history of diseases nor engages in risky social behavior (examples are intravenous drug use or sexual activity with multiple partners). The donor's travel history is screened to minimize risk of transmitting infections, such as malaria. The donated blood is tested for signs of infectious diseases,  such as HIV and hepatitis. The blood is then tested to be sure it is compatible with you in order to minimize the chance of a transfusion reaction. If you or a relative donates blood, this is often done in anticipation of surgery and is not appropriate for emergency situations. It takes many days to process the donated blood. RISKS AND COMPLICATIONS Although transfusion therapy is very safe and saves many lives, the main dangers of transfusion include:   Getting an infectious disease.  Developing a transfusion reaction. This is an allergic reaction to something in the blood you were given. Every precaution is taken to prevent this. The decision to have a blood transfusion has been considered carefully by your caregiver before blood is given. Blood is not given unless the benefits outweigh the risks.  AFTER SURGERY INSTRUCTIONS  Return to work:  4-6 weeks if applicable  Activity: 1. Be up and out of the bed during the day.  Take a nap if needed.  You may walk up steps but be careful and use the hand rail.  Stair climbing will tire you more than you think, you may need to stop part way and rest.   2. No lifting or straining for 6 weeks over 10 pounds. No pushing, pulling, straining for 6 weeks.  3. No driving for 1 week.  Do not drive if you are taking narcotic pain medicine.   4. You can shower as soon as the next day after surgery. Shower daily.  Use soap and water on your incision and pat dry; don't rub.  No tub baths or submerging your body in water until cleared by your surgeon. If you have the soap that was given to you by pre-surgical testing that was used before surgery, you do not need to use it afterwards because this can irritate your incisions.   5. No sexual activity and nothing in the vagina for 2 weeks.  6. You may experience a small amount of clear drainage from your incisions, which is normal.  If the drainage persists, increases, or changes color please call the office.  7.  Do not use creams, lotions, or ointments such as neosporin on your incisions after surgery until advised by your surgeon because they can cause removal of the dermabond glue on your incisions.    8. You may experience vaginal spotting after surgery. The spotting is normal but if you experience heavy bleeding, call our office.  9. Take Tylenol or ibuprofen first for pain and only use narcotic pain medication for severe pain not relieved by the Tylenol or Ibuprofen.  Monitor your Tylenol intake to a max of 4,000 mg.  Diet: 1. Low sodium Heart Healthy Diet is recommended.  2. It is safe to use a laxative, such as Miralax or Colace, if you have difficulty moving your bowels. You can take Sennakot at bedtime every evening to keep bowel movements regular and to prevent constipation.    Wound Care: 1. Keep clean and dry.  Shower daily.  Reasons to call the Doctor:  Fever - Oral temperature greater than 100.4 degrees Fahrenheit  Foul-smelling vaginal discharge  Difficulty urinating  Nausea and vomiting  Increased pain at the site of the incision that is unrelieved with pain medicine.  Difficulty breathing with or without chest pain  New calf pain especially if only on one side  Sudden, continuing increased vaginal bleeding with or without clots.   Contacts: For questions or concerns you should contact:  Dr. Jeral Pinch at [phone]  Joylene John, NP at [phone]  After Hours: call [phone] and have the GYN Oncologist paged/contacted   Bilateral Salpingo-Oophorectomy, Care After  This sheet gives you information about how to care for yourself after your procedure. Your health care provider may also give you more specific instructions. If you have problems or questions, contact your health care provider. What can I expect after the procedure? After the procedure, it is common to have:  Abdominal pain.  Some occasional vaginal bleeding  (spotting).  Tiredness.  Symptoms of menopause, such as hot flashes, night sweats, or mood swings. Follow these instructions at home: Incision care   Keep your incision area and your bandage (dressing) clean and dry.  Follow instructions from your health care provider about how to take care of your incision. Make sure you: ? Leave stitches (sutures), staples,  skin glue, or adhesive strips in place. These skin closures may need to stay in place for 2 weeks or longer. If adhesive strip edges start to loosen and curl up, you may trim the loose edges. Do not remove adhesive strips completely unless your health care provider tells you to do that.  Check your incision area every day for signs of infection. Check for: ? Redness, swelling, or pain. ? Fluid or blood. ? Warmth. ? Pus or a bad smell. Activity   Do not drive or use heavy machinery while taking prescription pain medicine.  Take frequent, short walks throughout the day. Rest when you get tired. Ask your health care provider what activities are safe for you.  Avoid activity that requires great effort. Also, avoid heavy lifting. Do not lift anything that is heavier than 10 lbs. (4.5 kg), or the limit that your health care provider tells you, until he or she says that it is safe to do so.  Do not douche, use tampons, or have sex until your health care provider approves. General instructions   To prevent or treat constipation while you are taking prescription pain medicine, your health care provider may recommend that you: ? Drink enough fluid to keep your urine clear or pale yellow. ? Take over-the-counter or prescription medicines. ? Eat foods that are high in fiber, such as fresh fruits and vegetables, whole grains, and beans. ? Limit foods that are high in fat and processed sugars, such as fried and sweet foods.  Take over-the-counter and prescription medicines only as told by your health care provider.  Do not take baths,  swim, or use a hot tub until your health care provider approves. Ask your health care provider if you can take showers. You may only be allowed to take sponge baths for bathing.  Wear compression stockings as told by your health care provider. These stockings help to prevent blood clots and reduce swelling in your legs.  Keep all follow-up visits as told by your health care provider. This is important. Contact a health care provider if:  You have pain when you urinate.  You have pus or a bad smelling discharge coming from your vagina.  You have redness, swelling, or pain around your incision.  You have fluid or blood coming from your incision.  Your incision feels warm to the touch.  You have pus or a bad smell coming from your incision.  You have a fever.  Your incision starts to break open.  You have pain in the abdomen, and it gets worse or does not get better when you take medicine.  You develop a rash.  You develop nausea and vomiting.  You feel lightheaded. Get help right away if:  You develop pain in your chest or leg.  You become short of breath.  You faint.  You have increased bleeding from your vagina. Summary  After the procedure, it is common to have pain, bleeding in the vagina, and symptoms of menopause.  Follow instructions from your health care provider about how to take care of your incision.  Follow instructions from your health care provider about activities and restrictions.  Check your incision every day for signs of infection and report any symptoms to your health care provider. This information is not intended to replace advice given to you by your health care provider. Make sure you discuss any questions you have with your health care provider. Document Revised: 09/15/2018 Document Reviewed: 08/16/2016 Elsevier Patient Education  [place].

## 2019-10-03 NOTE — Telephone Encounter (Signed)
I talk with patient regarding schedule  

## 2019-10-04 ENCOUNTER — Ambulatory Visit
Admission: RE | Admit: 2019-10-04 | Discharge: 2019-10-04 | Disposition: A | Discharge status: Home / Self Care | Payer: 59 | Source: Ambulatory Visit | Attending: Radiation Oncology | Admitting: Radiation Oncology

## 2019-10-04 ENCOUNTER — Other Ambulatory Visit: Payer: Self-pay | Admitting: Gynecologic Oncology

## 2019-10-04 ENCOUNTER — Other Ambulatory Visit: Payer: Self-pay

## 2019-10-04 DIAGNOSIS — C50412 Malignant neoplasm of upper-outer quadrant of left female breast: Secondary | ICD-10-CM | POA: Diagnosis not present

## 2019-10-04 DIAGNOSIS — Z170 Estrogen receptor positive status [ER+]: Secondary | ICD-10-CM

## 2019-10-04 DIAGNOSIS — Z510 Encounter for antineoplastic radiation therapy: Secondary | ICD-10-CM | POA: Diagnosis not present

## 2019-10-05 ENCOUNTER — Other Ambulatory Visit: Payer: Self-pay

## 2019-10-05 ENCOUNTER — Ambulatory Visit: Payer: 59 | Admitting: Radiation Oncology

## 2019-10-05 ENCOUNTER — Ambulatory Visit
Admission: RE | Admit: 2019-10-05 | Discharge: 2019-10-05 | Disposition: A | Discharge status: Home / Self Care | Payer: 59 | Source: Ambulatory Visit | Attending: Radiation Oncology | Admitting: Radiation Oncology

## 2019-10-05 DIAGNOSIS — Z170 Estrogen receptor positive status [ER+]: Secondary | ICD-10-CM | POA: Diagnosis not present

## 2019-10-05 DIAGNOSIS — C50412 Malignant neoplasm of upper-outer quadrant of left female breast: Secondary | ICD-10-CM | POA: Diagnosis not present

## 2019-10-05 DIAGNOSIS — Z510 Encounter for antineoplastic radiation therapy: Secondary | ICD-10-CM | POA: Diagnosis not present

## 2019-10-08 ENCOUNTER — Ambulatory Visit
Admission: RE | Admit: 2019-10-08 | Discharge: 2019-10-08 | Disposition: A | Discharge status: Home / Self Care | Payer: 59 | Source: Ambulatory Visit | Attending: Radiation Oncology | Admitting: Radiation Oncology

## 2019-10-08 ENCOUNTER — Other Ambulatory Visit: Payer: Self-pay

## 2019-10-08 DIAGNOSIS — C50412 Malignant neoplasm of upper-outer quadrant of left female breast: Secondary | ICD-10-CM | POA: Diagnosis not present

## 2019-10-08 DIAGNOSIS — Z510 Encounter for antineoplastic radiation therapy: Secondary | ICD-10-CM | POA: Diagnosis not present

## 2019-10-08 DIAGNOSIS — Z170 Estrogen receptor positive status [ER+]: Secondary | ICD-10-CM | POA: Diagnosis not present

## 2019-10-09 ENCOUNTER — Ambulatory Visit
Admission: RE | Admit: 2019-10-09 | Discharge: 2019-10-09 | Disposition: A | Discharge status: Home / Self Care | Payer: 59 | Source: Ambulatory Visit | Attending: Radiation Oncology | Admitting: Radiation Oncology

## 2019-10-09 ENCOUNTER — Other Ambulatory Visit: Payer: Self-pay

## 2019-10-09 DIAGNOSIS — Z510 Encounter for antineoplastic radiation therapy: Secondary | ICD-10-CM | POA: Diagnosis not present

## 2019-10-09 DIAGNOSIS — Z170 Estrogen receptor positive status [ER+]: Secondary | ICD-10-CM | POA: Diagnosis not present

## 2019-10-09 DIAGNOSIS — C50412 Malignant neoplasm of upper-outer quadrant of left female breast: Secondary | ICD-10-CM | POA: Diagnosis not present

## 2019-10-10 ENCOUNTER — Other Ambulatory Visit: Payer: Self-pay

## 2019-10-10 ENCOUNTER — Ambulatory Visit
Admission: RE | Admit: 2019-10-10 | Discharge: 2019-10-10 | Disposition: A | Discharge status: Home / Self Care | Payer: 59 | Source: Ambulatory Visit | Attending: Radiation Oncology | Admitting: Radiation Oncology

## 2019-10-10 DIAGNOSIS — Z170 Estrogen receptor positive status [ER+]: Secondary | ICD-10-CM | POA: Diagnosis not present

## 2019-10-10 DIAGNOSIS — C50412 Malignant neoplasm of upper-outer quadrant of left female breast: Secondary | ICD-10-CM | POA: Diagnosis not present

## 2019-10-10 DIAGNOSIS — Z510 Encounter for antineoplastic radiation therapy: Secondary | ICD-10-CM | POA: Diagnosis not present

## 2019-10-11 ENCOUNTER — Other Ambulatory Visit: Payer: Self-pay

## 2019-10-11 ENCOUNTER — Ambulatory Visit
Admission: RE | Admit: 2019-10-11 | Discharge: 2019-10-11 | Disposition: A | Discharge status: Home / Self Care | Payer: 59 | Source: Ambulatory Visit | Attending: Radiation Oncology | Admitting: Radiation Oncology

## 2019-10-11 DIAGNOSIS — C50412 Malignant neoplasm of upper-outer quadrant of left female breast: Secondary | ICD-10-CM | POA: Diagnosis not present

## 2019-10-11 DIAGNOSIS — Z510 Encounter for antineoplastic radiation therapy: Secondary | ICD-10-CM | POA: Diagnosis not present

## 2019-10-11 DIAGNOSIS — Z170 Estrogen receptor positive status [ER+]: Secondary | ICD-10-CM | POA: Diagnosis not present

## 2019-10-11 NOTE — Progress Notes (Addendum)
DUE TO COVID-19 ONLY ONE VISITOR IS ALLOWED TO COME WITH YOU AND STAY IN THE WAITING ROOM ONLY DURING PRE OP AND PROCEDURE DAY OF SURGERY. THE 1 VISITOR MAY VISIT WITH YOU AFTER SURGERY IN YOUR PRIVATE ROOM DURING VISITING HOURS ONLY!  YOU NEED TO HAVE A COVID 19 TEST ON_______ @_______ , THIS TEST MUST BE DONE BEFORE SURGERY, COME  [place], [place]  , [id number].  ([place]) ONCE YOUR COVID TEST IS COMPLETED, PLEASE BEGIN THE QUARANTINE INSTRUCTIONS AS OUTLINED IN YOUR HANDOUT.                Aubray Santori  10/11/2019   Your procedure is scheduled on:  10/23/2019   Report to [facility] Main  Entrance   Report to admitting at    [place] AM     Call this number if you have problems the morning of surgery [phone]    Remember: Do not eat food  :After Midnight. BRUSH YOUR TEETH MORNING OF SURGERY AND RINSE YOUR MOUTH OUT, NO CHEWING GUM [place] OR MINTS. May have clear liquids until 0430am morning of surgery .  Eat a light diet the day before surgery.   Avoid gas producing foods.       CLEAR LIQUID DIET   Foods Allowed                                                                     Foods Excluded  Coffee and tea, regular and decaf                             liquids that you cannot  Plain Jell-O any favor except red or purple                                           see through such as: Fruit ices (not with fruit pulp)                                     milk, soups, orange juice  Iced Popsicles                                    All solid food Carbonated beverages, regular and diet                                    Cranberry, grape and apple juices Sports drinks like Gatorade Lightly seasoned clear broth or consume(fat free) Sugar, honey syrup  Sample Menu Breakfast                                Lunch  Supper Cranberry juice                    Beef broth                            Chicken broth Jell-O                                      Grape juice                           Apple juice Coffee or tea                        Jell-O                                      Popsicle                                                Coffee or tea                        Coffee or tea  _____________________________________________________________________     Take these medicines the morning of surgery with A SIP OF WATER: none                                   You may not have any metal on your body including hair pins and              piercings  Do not wear jewelry, make-up, lotions, powders or perfumes, deodorant             Do not wear nail polish on your fingernails.  Do not shave  48 hours prior to surgery.          .   Do not bring valuables to the hospital. [place].  Contacts, dentures or bridgework may not be worn into surgery.  Leave suitcase in the car. After surgery it may be brought to your room.     Patients discharged the day of surgery will not be allowed to drive home. IF YOU ARE HAVING SURGERY AND GOING HOME THE SAME DAY, YOU MUST HAVE AN ADULT TO DRIVE YOU HOME AND BE WITH YOU FOR 24 HOURS. YOU MAY GO HOME BY TAXI OR UBER OR ORTHERWISE, BUT AN ADULT MUST ACCOMPANY YOU HOME AND STAY WITH YOU FOR 24 HOURS.  Name and phone number of your driver:                Please read over the following fact sheets you were given: _____________________________________________________________________                        _____________________________________________________________________  [facility] - Preparing for Surgery Before surgery, you can play an important role.  Because skin is not sterile, your skin needs to  be as free of germs as possible.  You can reduce the number of germs on your skin by washing with CHG (chlorahexidine gluconate) soap before surgery.  CHG is an antiseptic cleaner which kills germs and bonds with  the skin to continue killing germs even after washing. Please DO NOT use if you have an allergy to CHG or antibacterial soaps.  If your skin becomes reddened/irritated stop using the CHG and inform your nurse when you arrive at Short Stay. Do not shave (including legs and underarms) for at least 48 hours prior to the first CHG shower.  You may shave your face/neck. Please follow these instructions carefully:  1.  Shower with CHG Soap the night before surgery and the  morning of Surgery.  2.  If you choose to wash your hair, wash your hair first as usual with your  normal  shampoo.  3.  After you shampoo, rinse your hair and body thoroughly to remove the  shampoo.                           4.  Use CHG as you would any other liquid soap.  You can apply chg directly  to the skin and wash                       Gently with a scrungie or clean washcloth.  5.  Apply the CHG Soap to your body ONLY FROM THE NECK DOWN.   Do not use on face/ open                           Wound or open sores. Avoid contact with eyes, ears mouth and genitals (private parts).                       Wash face,  Genitals (private parts) with your normal soap.             6.  Wash thoroughly, paying special attention to the area where your surgery  will be performed.  7.  Thoroughly rinse your body with warm water from the neck down.  8.  DO NOT shower/wash with your normal soap after using and rinsing off  the CHG Soap.                9.  Pat yourself dry with a clean towel.            10.  Wear clean pajamas.            11.  Place clean sheets on your bed the night of your first shower and do not  sleep with pets. Day of Surgery : Do not apply any lotions/deodorants the morning of surgery.  Please wear clean clothes to the hospital/surgery center.  FAILURE TO FOLLOW THESE INSTRUCTIONS MAY RESULT IN THE CANCELLATION OF YOUR SURGERY PATIENT SIGNATURE_________________________________  NURSE  SIGNATURE__________________________________  ________________________________________________________________________   [name]  An incentive spirometer is a tool that can help keep your lungs clear and active. This tool measures how well you are filling your lungs with each breath. Taking long deep breaths may help reverse or decrease the chance of developing breathing (pulmonary) problems (especially infection) following:  A long period of time when you are unable to move or be active. BEFORE THE PROCEDURE   If the spirometer includes an indicator to show your  best effort, your nurse or respiratory therapist will set it to a desired goal.  If possible, sit up straight or lean slightly forward. Try not to slouch.  Hold the incentive spirometer in an upright position. INSTRUCTIONS FOR USE  1. Sit on the edge of your bed if possible, or sit up as far as you can in bed or on a chair. 2. Hold the incentive spirometer in an upright position. 3. Breathe out normally. 4. Place the mouthpiece in your mouth and seal your lips tightly around it. 5. Breathe in slowly and as deeply as possible, raising the piston or the ball toward the top of the column. 6. Hold your breath for 3-5 seconds or for as long as possible. Allow the piston or ball to fall to the bottom of the column. 7. Remove the mouthpiece from your mouth and breathe out normally. 8. Rest for a few seconds and repeat Steps 1 through 7 at least 10 times every 1-2 hours when you are awake. Take your time and take a few normal breaths between deep breaths. 9. The spirometer may include an indicator to show your best effort. Use the indicator as a goal to work toward during each repetition. 10. After each set of 10 deep breaths, practice coughing to be sure your lungs are clear. If you have an incision (the cut made at the time of surgery), support your incision when coughing by placing a pillow or rolled up towels firmly  against it. Once you are able to get out of bed, walk around indoors and cough well. You may stop using the incentive spirometer when instructed by your caregiver.  RISKS AND COMPLICATIONS  Take your time so you do not get dizzy or light-headed.  If you are in pain, you may need to take or ask for pain medication before doing incentive spirometry. It is harder to take a deep breath if you are having pain. AFTER USE  Rest and breathe slowly and easily.  It can be helpful to keep track of a log of your progress. Your caregiver can provide you with a simple table to help with this. If you are using the spirometer at home, follow these instructions: [place] IF:   You are having difficultly using the spirometer.  You have trouble using the spirometer as often as instructed.  Your pain medication is not giving enough relief while using the spirometer.  You develop fever of 100.5 F (38.1 C) or higher. SEEK IMMEDIATE MEDICAL CARE IF:   You cough up bloody sputum that had not been present before.  You develop fever of 102 F (38.9 C) or greater.  You develop worsening pain at or near the incision site. MAKE SURE YOU:   Understand these instructions.  Will watch your condition.  Will get help right away if you are not doing well or get worse. Document Released: 11/22/2006 Document Revised: 10/04/2011 Document Reviewed: 01/23/2007 Exit[employer] Patient Information 2014 [employer], [place].   ________________________________________________________________________  WHAT IS A BLOOD TRANSFUSION? Blood Transfusion Information  A transfusion is the replacement of blood or some of its parts. Blood is made up of multiple cells which provide different functions.  Red blood cells carry oxygen and are used for blood loss replacement.  White blood cells fight against infection.  Platelets control bleeding.  Plasma helps clot blood.  Other blood products are available for  specialized needs, such as hemophilia or other clotting disorders. BEFORE THE TRANSFUSION  Who gives blood for  transfusions?   Healthy volunteers who are fully evaluated to make sure their blood is safe. This is blood bank blood. Transfusion therapy is the safest it has ever been in the practice of medicine. Before blood is taken from a donor, a complete history is taken to make sure that person has no history of diseases nor engages in risky social behavior (examples are intravenous drug use or sexual activity with multiple partners). The donor's travel history is screened to minimize risk of transmitting infections, such as malaria. The donated blood is tested for signs of infectious diseases, such as HIV and hepatitis. The blood is then tested to be sure it is compatible with you in order to minimize the chance of a transfusion reaction. If you or a relative donates blood, this is often done in anticipation of surgery and is not appropriate for emergency situations. It takes many days to process the donated blood. RISKS AND COMPLICATIONS Although transfusion therapy is very safe and saves many lives, the main dangers of transfusion include:   Getting an infectious disease.  Developing a transfusion reaction. This is an allergic reaction to something in the blood you were given. Every precaution is taken to prevent this. The decision to have a blood transfusion has been considered carefully by your caregiver before blood is given. Blood is not given unless the benefits outweigh the risks. AFTER THE TRANSFUSION  Right after receiving a blood transfusion, you will usually feel much better and more energetic. This is especially true if your red blood cells have gotten low (anemic). The transfusion raises the level of the red blood cells which carry oxygen, and this usually causes an energy increase.  The nurse administering the transfusion will monitor you carefully for complications. HOME CARE  INSTRUCTIONS  No special instructions are needed after a transfusion. You may find your energy is better. Speak with your caregiver about any limitations on activity for underlying diseases you may have. SEEK MEDICAL CARE IF:   Your condition is not improving after your transfusion.  You develop redness or irritation at the intravenous (IV) site. SEEK IMMEDIATE MEDICAL CARE IF:  Any of the following symptoms occur over the next 12 hours:  Shaking chills.  You have a temperature by mouth above 102 F (38.9 C), not controlled by medicine.  Chest, back, or muscle pain.  People around you feel you are not acting correctly or are confused.  Shortness of breath or difficulty breathing.  Dizziness and fainting.  You get a rash or develop hives.  You have a decrease in urine output.  Your urine turns a dark color or changes to pink, red, or brown. Any of the following symptoms occur over the next 10 days:  You have a temperature by mouth above 102 F (38.9 C), not controlled by medicine.  Shortness of breath.  Weakness after normal activity.  The white part of the eye turns yellow (jaundice).  You have a decrease in the amount of urine or are urinating less often.  Your urine turns a dark color or changes to pink, red, or brown. Document Released: 07/09/2000 Document Revised: 10/04/2011 Document Reviewed: 02/26/2008 [facility] Patient Information 2014 [place], [place].  _______________________________________________________________________

## 2019-10-12 ENCOUNTER — Other Ambulatory Visit: Payer: Self-pay

## 2019-10-12 ENCOUNTER — Ambulatory Visit
Admission: RE | Admit: 2019-10-12 | Discharge: 2019-10-12 | Disposition: A | Discharge status: Home / Self Care | Payer: 59 | Source: Ambulatory Visit | Attending: Radiation Oncology | Admitting: Radiation Oncology

## 2019-10-12 DIAGNOSIS — Z510 Encounter for antineoplastic radiation therapy: Secondary | ICD-10-CM | POA: Diagnosis not present

## 2019-10-12 DIAGNOSIS — C50412 Malignant neoplasm of upper-outer quadrant of left female breast: Secondary | ICD-10-CM | POA: Diagnosis not present

## 2019-10-12 DIAGNOSIS — Z170 Estrogen receptor positive status [ER+]: Secondary | ICD-10-CM | POA: Diagnosis not present

## 2019-10-15 ENCOUNTER — Encounter: Payer: Self-pay | Admitting: *Deleted

## 2019-10-15 ENCOUNTER — Ambulatory Visit: Payer: 59

## 2019-10-15 ENCOUNTER — Ambulatory Visit
Admission: RE | Admit: 2019-10-15 | Discharge: 2019-10-15 | Disposition: A | Discharge status: Home / Self Care | Payer: 59 | Source: Ambulatory Visit | Attending: Radiation Oncology | Admitting: Radiation Oncology

## 2019-10-15 ENCOUNTER — Other Ambulatory Visit: Payer: Self-pay

## 2019-10-15 ENCOUNTER — Encounter: Payer: Self-pay | Admitting: Radiation Oncology

## 2019-10-15 DIAGNOSIS — Z510 Encounter for antineoplastic radiation therapy: Secondary | ICD-10-CM | POA: Diagnosis not present

## 2019-10-15 DIAGNOSIS — C50412 Malignant neoplasm of upper-outer quadrant of left female breast: Secondary | ICD-10-CM | POA: Diagnosis not present

## 2019-10-15 DIAGNOSIS — Z170 Estrogen receptor positive status [ER+]: Secondary | ICD-10-CM | POA: Diagnosis not present

## 2019-10-16 ENCOUNTER — Ambulatory Visit: Payer: 59

## 2019-10-16 DIAGNOSIS — Z1152 Encounter for screening for COVID-19: Secondary | ICD-10-CM | POA: Diagnosis not present

## 2019-10-16 DIAGNOSIS — R6883 Chills (without fever): Secondary | ICD-10-CM | POA: Diagnosis not present

## 2019-10-16 NOTE — Patient Instructions (Signed)
DUE TO COVID-19 ONLY TWO VISITOR IS ALLOWED TO COME WITH YOU AND STAY IN THE WAITING ROOM ONLY DURING PRE OP AND PROCEDURE DAY OF SURGERY. THE 2 VISITORS MAY VISIT WITH YOU AFTER SURGERY IN YOUR PRIVATE ROOM DURING VISITING HOURS ONLY!  YOU NEED TO HAVE A COVID 19 TEST ON: 10/19/19 @       , THIS TEST MUST BE DONE BEFORE SURGERY, COME  [place], [place]  , [id number].  ([place]) ONCE YOUR COVID TEST IS COMPLETED, PLEASE BEGIN THE QUARANTINE INSTRUCTIONS AS OUTLINED IN YOUR HANDOUT.                Tonya Whitehead    Your procedure is scheduled on: 10/23/19   Report to [facility] Main  Entrance   Report to SHORT STAY at: 5:30 AM     Call this number if you have problems the morning of surgery [phone]    Remember:    [place], NO CHEWING GUM [place].     Take these medicines the morning of surgery with A SIP OF WATER: NEURONTIN.                                 You may not have any metal on your body including hair pins and              piercings  Do not wear jewelry, make-up, lotions, powders or perfumes, deodorant             Do not wear nail polish on your fingernails.  Do not shave  48 hours prior to surgery.                 Do not bring valuables to the hospital. [place].  Contacts, dentures or bridgework may not be worn into surgery.  Leave suitcase in the car. After surgery it may be brought to your room.     Patients discharged the day of surgery will not be allowed to drive home. IF YOU ARE HAVING SURGERY AND GOING HOME THE SAME DAY, YOU MUST HAVE AN ADULT TO DRIVE YOU HOME AND BE WITH YOU FOR 24 HOURS. YOU MAY GO HOME BY TAXI OR UBER OR ORTHERWISE, BUT AN ADULT MUST ACCOMPANY YOU HOME AND STAY WITH YOU FOR 24 HOURS.  Name and phone number of your driver:  Special Instructions: N/A              Please read over the  following fact sheets you were given: _____________________________________________________________________             NO SOLID FOOD AFTER MIDNIGHT THE NIGHT PRIOR TO SURGERY. NOTHING BY MOUTH EXCEPT CLEAR LIQUIDS UNTIL: 4:30 AM.   CLEAR LIQUID DIET   Foods Allowed                                                                     Foods Excluded  Coffee and tea, regular and decaf  liquids that you cannot  Plain Jell-O any favor except red or purple                                           see through such as: Fruit ices (not with fruit pulp)                                     milk, soups, orange juice  Iced Popsicles                                    All solid food Carbonated beverages, regular and diet                                    Cranberry, grape and apple juices Sports drinks like Gatorade Lightly seasoned clear broth or consume(fat free) Sugar, honey syrup  Sample Menu Breakfast                                Lunch                                     Supper Cranberry juice                    Beef broth                            Chicken broth Jell-O                                     Grape juice                           Apple juice Coffee or tea                        Jell-O                                      Popsicle                                                Coffee or tea                        Coffee or tea  _____________________________________________________________________  Eat a light diet the day before surgery.  Examples including soups, broths, toast, yogurt, mashed potatoes.  Things to avoid include carbonated beverages (fizzy beverages), raw fruits and raw vegetables, or beans.   If your bowels are filled with gas, your surgeon will have difficulty visualizing your pelvic organs which increases your surgical risks. - Preparing  for Surgery Before surgery, you can play an important role.  Because skin is not  sterile, your skin needs to be as free of germs as possible.  You can reduce the number of germs on your skin by washing with CHG (chlorahexidine gluconate) soap before surgery.  CHG is an antiseptic cleaner which kills germs and bonds with the skin to continue killing germs even after washing. Please DO NOT use if you have an allergy to CHG or antibacterial soaps.  If your skin becomes reddened/irritated stop using the CHG and inform your nurse when you arrive at Short Stay. Do not shave (including legs and underarms) for at least 48 hours prior to the first CHG shower.  You may shave your face/neck. Please follow these instructions carefully:  1.  Shower with CHG Soap the night before surgery and the  morning of Surgery.  2.  If you choose to wash your hair, wash your hair first as usual with your  normal  shampoo.  3.  After you shampoo, rinse your hair and body thoroughly to remove the  shampoo.                           4.  Use CHG as you would any other liquid soap.  You can apply chg directly  to the skin and wash                       Gently with a scrungie or clean washcloth.  5.  Apply the CHG Soap to your body ONLY FROM THE NECK DOWN.   Do not use on face/ open                           Wound or open sores. Avoid contact with eyes, ears mouth and genitals (private parts).                       Wash face,  Genitals (private parts) with your normal soap.             6.  Wash thoroughly, paying special attention to the area where your surgery  will be performed.  7.  Thoroughly rinse your body with warm water from the neck down.  8.  DO NOT shower/wash with your normal soap after using and rinsing off  the CHG Soap.                9.  Pat yourself dry with a clean towel.            10.  Wear clean pajamas.            11.  Place clean sheets on your bed the night of your first shower and do not  sleep with pets. Day of Surgery : Do not apply any lotions/deodorants the morning of surgery.   Please wear clean clothes to the hospital/surgery center.  FAILURE TO FOLLOW THESE INSTRUCTIONS MAY RESULT IN THE CANCELLATION OF YOUR SURGERY PATIENT SIGNATURE_________________________________  NURSE SIGNATURE__________________________________  ________________________________________________________________________   Tonya Whitehead  An incentive spirometer is a tool that can help keep your lungs clear and active. This tool measures how well you are filling your lungs with each breath. Taking long deep breaths may help reverse or decrease the chance of developing breathing (pulmonary) problems (especially infection) following:  A long period of time when you are  unable to move or be active. BEFORE THE PROCEDURE   If the spirometer includes an indicator to show your best effort, your nurse or respiratory therapist will set it to a desired goal.  If possible, sit up straight or lean slightly forward. Try not to slouch.  Hold the incentive spirometer in an upright position. INSTRUCTIONS FOR USE  1. Sit on the edge of your bed if possible, or sit up as far as you can in bed or on a chair. 2. Hold the incentive spirometer in an upright position. 3. Breathe out normally. 4. Place the mouthpiece in your mouth and seal your lips tightly around it. 5. Breathe in slowly and as deeply as possible, raising the piston or the ball toward the top of the column. 6. Hold your breath for 3-5 seconds or for as long as possible. Allow the piston or ball to fall to the bottom of the column. 7. Remove the mouthpiece from your mouth and breathe out normally. 8. Rest for a few seconds and repeat Steps 1 through 7 at least 10 times every 1-2 hours when you are awake. Take your time and take a few normal breaths between deep breaths. 9. The spirometer may include an indicator to show your best effort. Use the indicator as a goal to work toward during each repetition. 10. After each set of 10 deep  breaths, practice coughing to be sure your lungs are clear. If you have an incision (the cut made at the time of surgery), support your incision when coughing by placing a pillow or rolled up towels firmly against it. Once you are able to get out of bed, walk around indoors and cough well. You may stop using the incentive spirometer when instructed by your caregiver.  RISKS AND COMPLICATIONS  Take your time so you do not get dizzy or light-headed.  If you are in pain, you may need to take or ask for pain medication before doing incentive spirometry. It is harder to take a deep breath if you are having pain. AFTER USE  Rest and breathe slowly and easily.  It can be helpful to keep track of a log of your progress. Your caregiver can provide you with a simple table to help with this. If you are using the spirometer at home, follow these instructions: [place] IF:   You are having difficultly using the spirometer.  You have trouble using the spirometer as often as instructed.  Your pain medication is not giving enough relief while using the spirometer.  You develop fever of 100.5 F (38.1 C) or higher. SEEK IMMEDIATE MEDICAL CARE IF:   You cough up bloody sputum that had not been present before.  You develop fever of 102 F (38.9 C) or greater.  You develop worsening pain at or near the incision site. MAKE SURE YOU:   Understand these instructions.  Will watch your condition.  Will get help right away if you are not doing well or get worse. Document Released: 11/22/2006 Document Revised: 10/04/2011 Document Reviewed: 01/23/2007 [facility] Patient Information 2014 [place], [place].   ________________________________________________________________________

## 2019-10-17 ENCOUNTER — Encounter (HOSPITAL_COMMUNITY): Admission: RE | Admit: 2019-10-17 | Payer: 59 | Source: Ambulatory Visit

## 2019-10-17 ENCOUNTER — Other Ambulatory Visit (HOSPITAL_COMMUNITY): Payer: 59

## 2019-10-17 ENCOUNTER — Encounter (HOSPITAL_COMMUNITY)
Admission: RE | Admit: 2019-10-17 | Discharge: 2019-10-17 | Disposition: A | Discharge status: Home / Self Care | Payer: 59 | Source: Ambulatory Visit | Attending: Gynecologic Oncology | Admitting: Gynecologic Oncology

## 2019-10-17 NOTE — Progress Notes (Signed)
Pt. Was unable to do the phone interview,she said she's not feeling well,she's having fever,shills,stomach upset.She went yesterday for COVID rapid test,It was negative,but she still not feeling good today.

## 2019-10-17 NOTE — Progress Notes (Signed)
RN called Dr. Charisse March office to informed them about the Pt's situation,also gave them the phone number of our scheduler for them to reschedule surgery and appointments.

## 2019-10-24 NOTE — Progress Notes (Signed)
Tonya Whitehead  10/24/2019      Your procedure is scheduled on 11/05/19   Report to [place].M.  Call this number if you have problems the morning of [phone]  OUR ADDRESS IS [place], WE ARE LOCATED IN THE [facility]ICAL PLA[facility] WITH [facility].   Remember:  Do not eat food or drink liquids after midnight. Eat a light diet the day before surgery.  NO gas producing foods. May have clear liquids until 1000am morning of surgery then nothing by mouth.   Take these medicines the morning of surgery with A SIP OF WATER: none    Do not wear jewelry, make-up or nail polish.  Do not wear lotions, powders, or perfumes, or deoderant.  Do not shave 48 hours prior to surgery.  .  Do not bring valuables to the hospital.  [facility] is not responsible for any belongings or valuables.  Contacts, dentures or bridgework may not be worn into surgery.  Leave your suitcase in the car.  After surgery it may be brought to your room.  For patients admitted to the hospital, discharge time will be determined by your treatment team.  Patients discharged the day of surgery will not be allowed to drive home.     Please read over the following fact sheets that you were given:          [place] Allowed                                                                     Foods Excluded  Coffee and tea, regular and decaf                             liquids that you cannot  Plain Jell-O any favor except red or purple                                           see through such as: Fruit ices (not with fruit pulp)                                     milk, soups, orange juice  Iced Popsicles                                    All solid food Carbonated beverages, regular and diet                                    Cranberry, grape and apple juices Sports drinks like Gatorade Lightly seasoned clear broth or consume(fat free) Sugar,  honey syrup  Sample Menu Breakfast  Lunch                                     Supper Cranberry juice                    Beef broth                            Chicken broth Jell-O                                     Grape juice                           Apple juice Coffee or tea                        Jell-O                                      Popsicle                                                Coffee or tea                        Coffee or tea  _____________________________________________________________________    Incentive Spirometer  An incentive spirometer is a tool that can help keep your lungs clear and active. This tool measures how well you are filling your lungs with each breath. Taking long deep breaths may help reverse or decrease the chance of developing breathing (pulmonary) problems (especially infection) following:  A long period of time when you are unable to move or be active. BEFORE THE PROCEDURE   If the spirometer includes an indicator to show your best effort, your nurse or respiratory therapist will set it to a desired goal.  If possible, sit up straight or lean slightly forward. Try not to slouch.  Hold the incentive spirometer in an upright position. INSTRUCTIONS FOR USE  1. Sit on the edge of your bed if possible, or sit up as far as you can in bed or on a chair. 2. Hold the incentive spirometer in an upright position. 3. Breathe out normally. 4. Place the mouthpiece in your mouth and seal your lips tightly around it. 5. Breathe in slowly and as deeply as possible, raising the piston or the ball toward the top of the column. 6. Hold your breath for 3-5 seconds or for as long as possible. Allow the piston or ball to fall to the bottom of the column. 7. Remove the mouthpiece from your mouth and breathe out normally. 8. Rest for a few seconds and repeat Steps 1 through 7 at least 10 times every 1-2 hours when you are awake.  Take your time and take a few normal breaths between deep breaths. 9. The spirometer may include an indicator to show your best effort. Use the indicator as a goal to work toward during each repetition. 10. After each set of 10 deep breaths,  practice coughing to be sure your lungs are clear. If you have an incision (the cut made at the time of surgery), support your incision when coughing by placing a pillow or rolled up towels firmly against it. Once you are able to get out of bed, walk around indoors and cough well. You may stop using the incentive spirometer when instructed by your caregiver.  RISKS AND COMPLICATIONS  Take your time so you do not get dizzy or light-headed.  If you are in pain, you may need to take or ask for pain medication before doing incentive spirometry. It is harder to take a deep breath if you are having pain. AFTER USE  Rest and breathe slowly and easily.  It can be helpful to keep track of a log of your progress. Your caregiver can provide you with a simple table to help with this. If you are using the spirometer at home, follow these instructions: [place] IF:   You are having difficultly using the spirometer.  You have trouble using the spirometer as often as instructed.  Your pain medication is not giving enough relief while using the spirometer.  You develop fever of 100.5 F (38.1 C) or higher. SEEK IMMEDIATE MEDICAL CARE IF:   You cough up bloody sputum that had not been present before.  You develop fever of 102 F (38.9 C) or greater.  You develop worsening pain at or near the incision site. MAKE SURE YOU:   Understand these instructions.  Will watch your condition.  Will get help right away if you are not doing well or get worse. Document Released: 11/22/2006 Document Revised: 10/04/2011 Document Reviewed: 01/23/2007 ExitCare Patient Information 2014 ExitCare,  [place].   ________________________________________________________________________  WHAT IS A BLOOD TRANSFUSION? Blood Transfusion Information  A transfusion is the replacement of blood or some of its parts. Blood is made up of multiple cells which provide different functions.  Red blood cells carry oxygen and are used for blood loss replacement.  White blood cells fight against infection.  Platelets control bleeding.  Plasma helps clot blood.  Other blood products are available for specialized needs, such as hemophilia or other clotting disorders. BEFORE THE TRANSFUSION  Who gives blood for transfusions?   Healthy volunteers who are fully evaluated to make sure their blood is safe. This is blood bank blood. Transfusion therapy is the safest it has ever been in the practice of medicine. Before blood is taken from a donor, a complete history is taken to make sure that person has no history of diseases nor engages in risky social behavior (examples are intravenous drug use or sexual activity with multiple partners). The donor's travel history is screened to minimize risk of transmitting infections, such as malaria. The donated blood is tested for signs of infectious diseases, such as HIV and hepatitis. The blood is then tested to be sure it is compatible with you in order to minimize the chance of a transfusion reaction. If you or a relative donates blood, this is often done in anticipation of surgery and is not appropriate for emergency situations. It takes many days to process the donated blood. RISKS AND COMPLICATIONS Although transfusion therapy is very safe and saves many lives, the main dangers of transfusion include:   Getting an infectious disease.  Developing a transfusion reaction. This is an allergic reaction to something in the blood you were given. Every precaution is taken to prevent this. The decision to have a blood transfusion has been considered  carefully by your caregiver  before blood is given. Blood is not given unless the benefits outweigh the risks. AFTER THE TRANSFUSION  Right after receiving a blood transfusion, you will usually feel much better and more energetic. This is especially true if your red blood cells have gotten low (anemic). The transfusion raises the level of the red blood cells which carry oxygen, and this usually causes an energy increase.  The nurse administering the transfusion will monitor you carefully for complications. HOME CARE INSTRUCTIONS  No special instructions are needed after a transfusion. You may find your energy is better. Speak with your caregiver about any limitations on activity for underlying diseases you may have. SEEK MEDICAL CARE IF:   Your condition is not improving after your transfusion.  You develop redness or irritation at the intravenous (IV) site. SEEK IMMEDIATE MEDICAL CARE IF:  Any of the following symptoms occur over the next 12 hours:  Shaking chills.  You have a temperature by mouth above 102 F (38.9 C), not controlled by medicine.  Chest, back, or muscle pain.  People around you feel you are not acting correctly or are confused.  Shortness of breath or difficulty breathing.  Dizziness and fainting.  You get a rash or develop hives.  You have a decrease in urine output.  Your urine turns a dark color or changes to pink, red, or brown. Any of the following symptoms occur over the next 10 days:  You have a temperature by mouth above 102 F (38.9 C), not controlled by medicine.  Shortness of breath.  Weakness after normal activity.  The white part of the eye turns yellow (jaundice).  You have a decrease in the amount of urine or are urinating less often.  Your urine turns a dark color or changes to pink, red, or brown. Document Released: 07/09/2000 Document Revised: 10/04/2011 Document Reviewed: 02/26/2008 ExitCare Patient Information 2014 [place].  _______________________________________________________________________[facility] - Preparing for Surgery Before surgery, you can play an important role.  Because skin is not sterile, your skin needs to be as free of germs as possible.  You can reduce the number of germs on your skin by washing with CHG (chlorahexidine gluconate) soap before surgery.  CHG is an antiseptic cleaner which kills germs and bonds with the skin to continue killing germs even after washing. Please DO NOT use if you have an allergy to CHG or antibacterial soaps.  If your skin becomes reddened/irritated stop using the CHG and inform your nurse when you arrive at Short Stay. Do not shave (including legs and underarms) for at least 48 hours prior to the first CHG shower.  You may shave your face/neck. Please follow these instructions carefully:  1.  Shower with CHG Soap the night before surgery and the  morning of Surgery.  2.  If you choose to wash your hair, wash your hair first as usual with your  normal  shampoo.  3.  After you shampoo, rinse your hair and body thoroughly to remove the  shampoo.                           4.  Use CHG as you would any other liquid soap.  You can apply chg directly  to the skin and wash                       Gently with a scrungie or clean washcloth.  5.  Apply the CHG Soap to your body ONLY FROM THE NECK DOWN.   Do not use on face/ open                           Wound or open sores. Avoid contact with eyes, ears mouth and genitals (private parts).                       Wash face,  Genitals (private parts) with your normal soap.             6.  Wash thoroughly, paying special attention to the area where your surgery  will be performed.  7.  Thoroughly rinse your body with warm water from the neck down.  8.  DO NOT shower/wash with your normal soap after using and rinsing off  the CHG Soap.                9.  Pat yourself dry with a clean towel.            10.  Wear clean pajamas.             11.  Place clean sheets on your bed the night of your first shower and do not  sleep with pets. Day of Surgery : Do not apply any lotions/deodorants the morning of surgery.  Please wear clean clothes to the hospital/surgery center.  FAILURE TO FOLLOW THESE INSTRUCTIONS MAY RESULT IN THE CANCELLATION OF YOUR SURGERY PATIENT SIGNATURE_________________________________  NURSE SIGNATURE__________________________________  ________________________________________________________________________

## 2019-10-25 ENCOUNTER — Telehealth: Payer: Self-pay | Admitting: *Deleted

## 2019-10-25 NOTE — Telephone Encounter (Signed)
Called the patient to follow up on how she is feeling. Patient stated that " I'm doing good. By Friday I was better." Explained that they will proceed with her surgery

## 2019-10-29 ENCOUNTER — Encounter (HOSPITAL_COMMUNITY)
Admission: RE | Admit: 2019-10-29 | Discharge: 2019-10-29 | Disposition: A | Discharge status: Home / Self Care | Payer: 59 | Source: Ambulatory Visit | Attending: Gynecologic Oncology | Admitting: Gynecologic Oncology

## 2019-10-29 ENCOUNTER — Other Ambulatory Visit: Payer: Self-pay

## 2019-10-29 DIAGNOSIS — Z01812 Encounter for preprocedural laboratory examination: Secondary | ICD-10-CM | POA: Diagnosis not present

## 2019-10-29 LAB — URINALYSIS, ROUTINE W REFLEX MICROSCOPIC
Bilirubin Urine: NEGATIVE
Glucose, UA: NEGATIVE mg/dL
Hgb urine dipstick: NEGATIVE
Ketones, ur: NEGATIVE mg/dL
Leukocytes,Ua: NEGATIVE
Nitrite: NEGATIVE
Protein, ur: NEGATIVE mg/dL
Specific Gravity, Urine: 1.003 — ABNORMAL LOW (ref 1.005–1.030)
pH: 6 (ref 5.0–8.0)

## 2019-10-29 LAB — CBC
HCT: 39.6 % (ref 36.0–46.0)
Hemoglobin: 12.7 g/dL (ref 12.0–15.0)
MCH: 30 pg (ref 26.0–34.0)
MCHC: 32.1 g/dL (ref 30.0–36.0)
MCV: 93.6 fL (ref 80.0–100.0)
Platelets: 429 10*3/uL — ABNORMAL HIGH (ref 150–400)
RBC: 4.23 MIL/uL (ref 3.87–5.11)
RDW: 13.3 % (ref 11.5–15.5)
WBC: 5.4 10*3/uL (ref 4.0–10.5)
nRBC: 0 % (ref 0.0–0.2)

## 2019-10-29 LAB — BASIC METABOLIC PANEL
Anion gap: 9 (ref 5–15)
BUN: 11 mg/dL (ref 6–20)
CO2: 27 mmol/L (ref 22–32)
Calcium: 9.3 mg/dL (ref 8.9–10.3)
Chloride: 105 mmol/L (ref 98–111)
Creatinine, Ser: 0.61 mg/dL (ref 0.44–1.00)
GFR calc Af Amer: >60 mL/min (ref >60)
GFR calc non Af Amer: >60 mL/min (ref >60)
Glucose, Bld: 79 mg/dL (ref 70–99)
Potassium: 4.1 mmol/L (ref 3.5–5.1)
Sodium: 141 mmol/L (ref 135–145)

## 2019-10-29 LAB — ABO/RH: ABO/RH(D): A POS

## 2019-11-01 ENCOUNTER — Other Ambulatory Visit (HOSPITAL_COMMUNITY)
Admission: RE | Admit: 2019-11-01 | Discharge: 2019-11-01 | Disposition: A | Discharge status: Home / Self Care | Payer: 59 | Source: Ambulatory Visit | Attending: Gynecologic Oncology | Admitting: Gynecologic Oncology

## 2019-11-01 ENCOUNTER — Other Ambulatory Visit (HOSPITAL_COMMUNITY): Payer: 59

## 2019-11-01 DIAGNOSIS — Z20822 Contact with and (suspected) exposure to covid-19: Secondary | ICD-10-CM | POA: Insufficient documentation

## 2019-11-01 DIAGNOSIS — Z01812 Encounter for preprocedural laboratory examination: Secondary | ICD-10-CM | POA: Diagnosis not present

## 2019-11-01 LAB — SARS CORONAVIRUS 2 (TAT 6-24 HRS): SARS Coronavirus 2: NEGATIVE

## 2019-11-02 ENCOUNTER — Telehealth: Payer: Self-pay

## 2019-11-02 NOTE — Telephone Encounter (Signed)
L/M for Tonya Whitehead to call back to the office if she has any questions or concerns regarding her pre-op instructions.

## 2019-11-05 ENCOUNTER — Encounter (HOSPITAL_BASED_OUTPATIENT_CLINIC_OR_DEPARTMENT_OTHER)
Admission: RE | Disposition: A | Discharge status: Home / Self Care | Payer: Self-pay | Source: Home / Self Care | Attending: Gynecologic Oncology

## 2019-11-05 ENCOUNTER — Other Ambulatory Visit: Payer: Self-pay

## 2019-11-05 ENCOUNTER — Ambulatory Visit (HOSPITAL_BASED_OUTPATIENT_CLINIC_OR_DEPARTMENT_OTHER): Payer: 59 | Admitting: Physician Assistant

## 2019-11-05 ENCOUNTER — Encounter (HOSPITAL_BASED_OUTPATIENT_CLINIC_OR_DEPARTMENT_OTHER): Payer: Self-pay | Admitting: Gynecologic Oncology

## 2019-11-05 ENCOUNTER — Ambulatory Visit (HOSPITAL_BASED_OUTPATIENT_CLINIC_OR_DEPARTMENT_OTHER)
Admission: RE | Admit: 2019-11-05 | Discharge: 2019-11-05 | Disposition: A | Discharge status: Home / Self Care | Payer: 59 | Attending: Gynecologic Oncology | Admitting: Gynecologic Oncology

## 2019-11-05 DIAGNOSIS — N8301 Follicular cyst of right ovary: Secondary | ICD-10-CM | POA: Diagnosis not present

## 2019-11-05 DIAGNOSIS — Z800 Family history of malignant neoplasm of digestive organs: Secondary | ICD-10-CM | POA: Diagnosis not present

## 2019-11-05 DIAGNOSIS — Z801 Family history of malignant neoplasm of trachea, bronchus and lung: Secondary | ICD-10-CM | POA: Diagnosis not present

## 2019-11-05 DIAGNOSIS — Z8052 Family history of malignant neoplasm of bladder: Secondary | ICD-10-CM | POA: Diagnosis not present

## 2019-11-05 DIAGNOSIS — Z79899 Other long term (current) drug therapy: Secondary | ICD-10-CM | POA: Diagnosis not present

## 2019-11-05 DIAGNOSIS — Z170 Estrogen receptor positive status [ER+]: Secondary | ICD-10-CM | POA: Diagnosis not present

## 2019-11-05 DIAGNOSIS — Z882 Allergy status to sulfonamides status: Secondary | ICD-10-CM | POA: Diagnosis not present

## 2019-11-05 DIAGNOSIS — C50412 Malignant neoplasm of upper-outer quadrant of left female breast: Secondary | ICD-10-CM

## 2019-11-05 DIAGNOSIS — Z8041 Family history of malignant neoplasm of ovary: Secondary | ICD-10-CM | POA: Diagnosis not present

## 2019-11-05 DIAGNOSIS — N8300 Follicular cyst of ovary, unspecified side: Secondary | ICD-10-CM | POA: Insufficient documentation

## 2019-11-05 DIAGNOSIS — N8302 Follicular cyst of left ovary: Secondary | ICD-10-CM | POA: Diagnosis not present

## 2019-11-05 DIAGNOSIS — C50812 Malignant neoplasm of overlapping sites of left female breast: Secondary | ICD-10-CM | POA: Diagnosis not present

## 2019-11-05 DIAGNOSIS — C50919 Malignant neoplasm of unspecified site of unspecified female breast: Secondary | ICD-10-CM | POA: Insufficient documentation

## 2019-11-05 DIAGNOSIS — Z1502 Genetic susceptibility to malignant neoplasm of ovary: Secondary | ICD-10-CM | POA: Diagnosis not present

## 2019-11-05 HISTORY — PX: ROBOTIC ASSISTED SALPINGO OOPHERECTOMY: SHX6082

## 2019-11-05 LAB — TYPE AND SCREEN
ABO/RH(D): A POS
Antibody Screen: NEGATIVE

## 2019-11-05 LAB — POCT PREGNANCY, URINE: Preg Test, Ur: NEGATIVE

## 2019-11-05 SURGERY — SALPINGO-OOPHORECTOMY, ROBOT-ASSISTED
Anesthesia: General | Site: Abdomen | Laterality: Bilateral

## 2019-11-05 MED ORDER — FENTANYL CITRATE (PF) 100 MCG/2ML IJ SOLN
25.0000 ug | INTRAMUSCULAR | Status: DC | PRN
  Filled 2019-11-05: qty 1

## 2019-11-05 MED ORDER — SUCCINYLCHOLINE CHLORIDE 20 MG/ML IJ SOLN
INTRAMUSCULAR | Status: DC | PRN
  Administered 2019-11-05: 80 mg via INTRAVENOUS

## 2019-11-05 MED ORDER — KETOROLAC TROMETHAMINE 30 MG/ML IJ SOLN
INTRAMUSCULAR | Status: AC
  Filled 2019-11-05: qty 1

## 2019-11-05 MED ORDER — LACTATED RINGERS IV SOLN
INTRAVENOUS | Status: DC
  Filled 2019-11-05 (×2): qty 1000

## 2019-11-05 MED ORDER — ONDANSETRON HCL 4 MG/2ML IJ SOLN
INTRAMUSCULAR | Status: AC
  Filled 2019-11-05: qty 2

## 2019-11-05 MED ORDER — TRAMADOL HCL 50 MG PO TABS
50.0000 mg | ORAL_TABLET | Freq: Four times a day (QID) | ORAL | Status: DC | PRN
  Administered 2019-11-05: 50 mg via ORAL
  Filled 2019-11-05: qty 1

## 2019-11-05 MED ORDER — GABAPENTIN 300 MG PO CAPS
300.0000 mg | ORAL_CAPSULE | ORAL | Status: AC
  Administered 2019-11-05: 300 mg via ORAL
  Filled 2019-11-05: qty 1

## 2019-11-05 MED ORDER — KETOROLAC TROMETHAMINE 30 MG/ML IJ SOLN
INTRAMUSCULAR | Status: DC | PRN
  Administered 2019-11-05: 15 mg via INTRAVENOUS

## 2019-11-05 MED ORDER — DEXAMETHASONE SODIUM PHOSPHATE 10 MG/ML IJ SOLN
INTRAMUSCULAR | Status: AC
  Filled 2019-11-05: qty 1

## 2019-11-05 MED ORDER — ONDANSETRON HCL 4 MG PO TABS
4.0000 mg | ORAL_TABLET | Freq: Four times a day (QID) | ORAL | Status: DC | PRN
  Filled 2019-11-05: qty 1

## 2019-11-05 MED ORDER — ARTIFICIAL TEARS OPHTHALMIC OINT
TOPICAL_OINTMENT | OPHTHALMIC | Status: AC
  Filled 2019-11-05: qty 3.5

## 2019-11-05 MED ORDER — BUPIVACAINE HCL 0.25 % IJ SOLN
INTRAMUSCULAR | Status: DC | PRN
  Administered 2019-11-05: 30 mL

## 2019-11-05 MED ORDER — SCOPOLAMINE 1 MG/3DAYS TD PT72
MEDICATED_PATCH | TRANSDERMAL | Status: AC
  Filled 2019-11-05: qty 1

## 2019-11-05 MED ORDER — ONDANSETRON 4 MG PO TBDP
ORAL_TABLET | ORAL | Status: AC
  Filled 2019-11-05: qty 1

## 2019-11-05 MED ORDER — OXYCODONE HCL 5 MG PO TABS
ORAL_TABLET | ORAL | Status: AC
  Filled 2019-11-05: qty 1

## 2019-11-05 MED ORDER — FENTANYL CITRATE (PF) 250 MCG/5ML IJ SOLN
INTRAMUSCULAR | Status: AC
  Filled 2019-11-05: qty 5

## 2019-11-05 MED ORDER — TRAMADOL HCL 50 MG PO TABS
ORAL_TABLET | ORAL | Status: AC
  Filled 2019-11-05: qty 1

## 2019-11-05 MED ORDER — ONDANSETRON 4 MG PO TBDP
4.0000 mg | ORAL_TABLET | Freq: Once | ORAL | Status: AC
  Administered 2019-11-05: 4 mg via ORAL
  Filled 2019-11-05: qty 1

## 2019-11-05 MED ORDER — KETAMINE HCL 10 MG/ML IJ SOLN
INTRAMUSCULAR | Status: DC | PRN
  Administered 2019-11-05: 25 mg via INTRAVENOUS

## 2019-11-05 MED ORDER — MIDAZOLAM HCL 2 MG/2ML IJ SOLN
INTRAMUSCULAR | Status: AC
  Filled 2019-11-05: qty 2

## 2019-11-05 MED ORDER — LIDOCAINE 2% (20 MG/ML) 5 ML SYRINGE
INTRAMUSCULAR | Status: AC
  Filled 2019-11-05: qty 10

## 2019-11-05 MED ORDER — PROPOFOL 10 MG/ML IV BOLUS
INTRAVENOUS | Status: DC | PRN
  Administered 2019-11-05: 120 mg via INTRAVENOUS
  Administered 2019-11-05: 30 mg via INTRAVENOUS

## 2019-11-05 MED ORDER — ACETAMINOPHEN 500 MG PO TABS
ORAL_TABLET | ORAL | Status: AC
  Filled 2019-11-05: qty 2

## 2019-11-05 MED ORDER — GABAPENTIN 300 MG PO CAPS
ORAL_CAPSULE | ORAL | Status: AC
  Filled 2019-11-05: qty 1

## 2019-11-05 MED ORDER — DEXAMETHASONE SODIUM PHOSPHATE 4 MG/ML IJ SOLN
4.0000 mg | INTRAMUSCULAR | Status: AC
  Administered 2019-11-05: 5 mg via INTRAVENOUS
  Filled 2019-11-05: qty 1

## 2019-11-05 MED ORDER — ROCURONIUM BROMIDE 100 MG/10ML IV SOLN
INTRAVENOUS | Status: DC | PRN
  Administered 2019-11-05: 40 mg via INTRAVENOUS

## 2019-11-05 MED ORDER — SCOPOLAMINE 1 MG/3DAYS TD PT72
1.0000 | MEDICATED_PATCH | TRANSDERMAL | Status: DC
  Administered 2019-11-05: 1.5 mg via TRANSDERMAL
  Filled 2019-11-05: qty 1

## 2019-11-05 MED ORDER — ACETAMINOPHEN 500 MG PO TABS
1000.0000 mg | ORAL_TABLET | Freq: Once | ORAL | Status: AC
  Administered 2019-11-05: 1000 mg via ORAL
  Filled 2019-11-05: qty 2

## 2019-11-05 MED ORDER — PROMETHAZINE HCL 25 MG/ML IJ SOLN
6.2500 mg | INTRAMUSCULAR | Status: DC | PRN
  Filled 2019-11-05: qty 1

## 2019-11-05 MED ORDER — OXYCODONE HCL 5 MG PO TABS
5.0000 mg | ORAL_TABLET | ORAL | Status: DC | PRN
  Filled 2019-11-05: qty 2

## 2019-11-05 MED ORDER — ONDANSETRON HCL 4 MG/2ML IJ SOLN
INTRAMUSCULAR | Status: DC | PRN
  Administered 2019-11-05: 4 mg via INTRAVENOUS

## 2019-11-05 MED ORDER — SODIUM CHLORIDE 0.9 % IR SOLN
Status: DC | PRN
  Administered 2019-11-05: 1000 mL

## 2019-11-05 MED ORDER — KETOROLAC TROMETHAMINE 15 MG/ML IJ SOLN
15.0000 mg | INTRAMUSCULAR | Status: DC
  Filled 2019-11-05: qty 1

## 2019-11-05 MED ORDER — ACETAMINOPHEN 500 MG PO TABS
1000.0000 mg | ORAL_TABLET | ORAL | Status: DC
  Filled 2019-11-05: qty 2

## 2019-11-05 MED ORDER — ROCURONIUM BROMIDE 10 MG/ML (PF) SYRINGE
PREFILLED_SYRINGE | INTRAVENOUS | Status: AC
  Filled 2019-11-05: qty 10

## 2019-11-05 MED ORDER — MIDAZOLAM HCL 5 MG/5ML IJ SOLN
INTRAMUSCULAR | Status: DC | PRN
  Administered 2019-11-05: 2 mg via INTRAVENOUS

## 2019-11-05 MED ORDER — PROPOFOL 10 MG/ML IV BOLUS
INTRAVENOUS | Status: AC
  Filled 2019-11-05: qty 20

## 2019-11-05 MED ORDER — OXYCODONE HCL 5 MG PO TABS: 5.0000 mg | ORAL_TABLET | ORAL | 0 refills | Status: DC | PRN

## 2019-11-05 MED ORDER — FENTANYL CITRATE (PF) 100 MCG/2ML IJ SOLN
INTRAMUSCULAR | Status: DC | PRN
  Administered 2019-11-05: 50 ug via INTRAVENOUS
  Administered 2019-11-05: 100 ug via INTRAVENOUS

## 2019-11-05 MED ORDER — SUGAMMADEX SODIUM 200 MG/2ML IV SOLN
INTRAVENOUS | Status: DC | PRN
  Administered 2019-11-05: 200 mg via INTRAVENOUS

## 2019-11-05 MED ORDER — ONDANSETRON HCL 4 MG/2ML IJ SOLN
4.0000 mg | Freq: Four times a day (QID) | INTRAMUSCULAR | Status: DC | PRN
  Filled 2019-11-05: qty 2

## 2019-11-05 MED ORDER — LIDOCAINE HCL (CARDIAC) PF 100 MG/5ML IV SOSY
PREFILLED_SYRINGE | INTRAVENOUS | Status: DC | PRN
  Administered 2019-11-05: 80 mg via INTRAVENOUS

## 2019-11-05 MED ORDER — HYDROMORPHONE HCL 1 MG/ML IJ SOLN
0.2000 mg | INTRAMUSCULAR | Status: DC | PRN
  Filled 2019-11-05: qty 1

## 2019-11-05 SURGICAL SUPPLY — 65 items
APPLICATOR SURGIFLO ENDO (HEMOSTASIS) IMPLANT
BAG LAPAROSCOPIC 12 15 PORT 16 (BASKET) IMPLANT
BAG RETRIEVAL 12/15 (BASKET)
BLADE SURG 10 STRL SS (BLADE) IMPLANT
COVER BACK TABLE 60X90IN (DRAPES) ×2 IMPLANT
COVER TIP SHEARS 8 DVNC (MISCELLANEOUS) ×1 IMPLANT
COVER TIP SHEARS 8MM DA VINCI (MISCELLANEOUS) ×1
COVER WAND RF STERILE (DRAPES) ×2 IMPLANT
DECANTER SPIKE VIAL GLASS SM (MISCELLANEOUS) IMPLANT
DERMABOND ADVANCED (GAUZE/BANDAGES/DRESSINGS) ×1
DERMABOND ADVANCED .7 DNX12 (GAUZE/BANDAGES/DRESSINGS) ×1 IMPLANT
DRAPE ARM DVNC X/XI (DISPOSABLE) ×4 IMPLANT
DRAPE COLUMN DVNC XI (DISPOSABLE) ×1 IMPLANT
DRAPE DA VINCI XI ARM (DISPOSABLE) ×4
DRAPE DA VINCI XI COLUMN (DISPOSABLE) ×1
DRAPE SHEET LG 3/4 BI-LAMINATE (DRAPES) ×2 IMPLANT
DRAPE SURG IRRIG POUCH 19X23 (DRAPES) ×2 IMPLANT
ELECT REM PT RETURN 9FT ADLT (ELECTROSURGICAL) ×2
ELECTRODE REM PT RTRN 9FT ADLT (ELECTROSURGICAL) ×1 IMPLANT
GAUZE 4X4 16PLY RFD (DISPOSABLE) ×2 IMPLANT
GLOVE BIO SURGEON STRL SZ 6 (GLOVE) ×4 IMPLANT
GLOVE BIO SURGEON STRL SZ 6.5 (GLOVE) ×2 IMPLANT
GLOVE SURG SS PI 6.0 STRL IVOR (GLOVE) ×6 IMPLANT
HOLDER FOLEY CATH W/STRAP (MISCELLANEOUS) ×2 IMPLANT
IRRIG SUCT STRYKERFLOW 2 WTIP (MISCELLANEOUS) ×2
IRRIGATION SUCT STRKRFLW 2 WTP (MISCELLANEOUS) ×1 IMPLANT
KIT PROCEDURE DA VINCI SI (MISCELLANEOUS)
KIT PROCEDURE DVNC SI (MISCELLANEOUS) IMPLANT
KIT TURNOVER CYSTO (KITS) IMPLANT
LEGGING LITHOTOMY PAIR STRL (DRAPES) ×2 IMPLANT
MANIPULATOR UTERINE 4.5 ZUMI (MISCELLANEOUS) ×2 IMPLANT
NDL SAFETY ECLIPSE 18X1.5 (NEEDLE) IMPLANT
NDL SPNL 18GX3.5 QUINCKE PK (NEEDLE) IMPLANT
NEEDLE HYPO 18GX1.5 SHARP (NEEDLE)
NEEDLE HYPO 22GX1.5 SAFETY (NEEDLE) ×2 IMPLANT
NEEDLE SPNL 18GX3.5 QUINCKE PK (NEEDLE) IMPLANT
OBTURATOR OPTICAL STANDARD 8MM (TROCAR) ×1
OBTURATOR OPTICAL STND 8 DVNC (TROCAR) ×1
OBTURATOR OPTICALSTD 8 DVNC (TROCAR) ×1 IMPLANT
PACK ROBOT GYN CUSTOM WL (TRAY / TRAY PROCEDURE) ×2 IMPLANT
PACK ROBOTIC GOWN (GOWN DISPOSABLE) ×2 IMPLANT
PAD POSITIONING PINK XL (MISCELLANEOUS) ×2 IMPLANT
PENCIL BUTTON HOLSTER BLD 10FT (ELECTRODE) IMPLANT
PORT ACCESS TROCAR AIRSEAL 12 (TROCAR) ×1 IMPLANT
PORT ACCESS TROCAR AIRSEAL 5M (TROCAR) ×1
POUCH SPECIMEN RETRIEVAL 10MM (ENDOMECHANICALS) IMPLANT
SEAL CANN UNIV 5-8 DVNC XI (MISCELLANEOUS) ×3 IMPLANT
SEAL XI 5MM-8MM UNIVERSAL (MISCELLANEOUS) ×3
SET TRI-LUMEN FLTR TB AIRSEAL (TUBING) ×2 IMPLANT
SURGIFLO W/THROMBIN 8M KIT (HEMOSTASIS) IMPLANT
SUT VIC AB 0 CT1 36 (SUTURE) IMPLANT
SUT VIC AB 3-0 SH 27 (SUTURE)
SUT VIC AB 3-0 SH 27X BRD (SUTURE) IMPLANT
SUT VIC AB 4-0 PS2 18 (SUTURE) ×4 IMPLANT
SYR 10ML LL (SYRINGE) IMPLANT
SYR CONTROL 10ML LL (SYRINGE) ×4 IMPLANT
SYS RETRIEVAL 5MM INZII UNIV (BASKET) ×2
SYSTEM RETRIEVL 5MM INZII UNIV (BASKET) IMPLANT
TRAP SPECIMEN MUCOUS 40CC (MISCELLANEOUS) IMPLANT
TRAY FOLEY W/BAG SLVR 14FR (SET/KITS/TRAYS/PACK) ×2 IMPLANT
TROCAR BLADELESS OPT 12M 100M (ENDOMECHANICALS) IMPLANT
TUBE CONNECTING 12X1/4 (SUCTIONS) ×2 IMPLANT
UNDERPAD 30X30 (UNDERPADS AND DIAPERS) ×2 IMPLANT
WATER STERILE IRR 1000ML POUR (IV SOLUTION) ×2 IMPLANT
YANKAUER SUCT BULB TIP NO VENT (SUCTIONS) IMPLANT

## 2019-11-05 NOTE — H&P (Signed)
GYNECOLOGIC ONCOLOGY H&P  Patient Name: Tonya Whitehead  Patient Age: 44 y.o.  History of Present Illness:  Tonya Whitehead is a 44 y.o. y.o. female who is seen in consultation at the request of Dr. Jana Hakim for an evaluation of therapeutic BSO in the setting of estrogen scepter positive breast cancer.  Her cancer history is noted below. Briefly, she was diagnosed with stage IB ER/PR+ breast cancer. S/p surgery in 12/2018 and adjuvant chemotherapy completed in 07/2019. She is now unergoing adjuvant radiation with plan for anti-estrogen therapy at completion of treatment.  She finishes radiation on 3/22.  In terms of symptoms related to cancer treatment, she endorses continued fatigue, hot flashes and feeling thirsty.  She is using gabapentin now which has helped significantly with her sleep.  Genetic testing in 12/2018 was negative.    Plan is for [facility] and estradiol testing this month to help guide anti-estrogen therapy.  Patient's last menstrual period was with her first cycle of chemotherapy.  She denies any vaginal bleeding or discharge since then.  She used OCPs in the past for less than a year.  Her family history is notable for multiple members diagnosed with cancers.  Her mother was diagnosed with ovarian cancer at age 24.  She was diagnosed with stage III disease and is now 4 years out from diagnosis.  PAST MEDICAL HISTORY:  Past Medical History:  Diagnosis Date  . Breast cancer ([place]) 12/08/2018   invasive lobular  . Family history of bladder cancer   . Family history of colon cancer   . Family history of lung cancer   . Family history of ovarian cancer      PAST SURGICAL HISTORY:  Past Surgical History:  Procedure Laterality Date  . ADENOIDECTOMY     as a child  . BREAST EXCISIONAL BIOPSY Left 2002   reconstructive surgery on nipple to match right breast  . IR IMAGING GUIDED PORT INSERTION  03/08/2019  . IR REMOVAL TUN ACCESS W/ PORT W/O FL MOD SED  08/09/2019     OB/GYN HISTORY:  OB History  Gravida Para Term Preterm AB Living  3 3          SAB TAB Ectopic Multiple Live Births               # Outcome Date GA Lbr Len/2nd Weight Sex Delivery Anes PTL Lv  3 Para           2 Para           1 Para             No LMP recorded. (Menstrual status: Perimenopausal).  MEDICATIONS: No current facility-administered medications on file prior to encounter.   Current Outpatient Medications on File Prior to Encounter  Medication Sig Dispense Refill  . coconut oil OIL Apply 1 application topically as needed (radiation).    . gabapentin (NEURONTIN) 300 MG capsule Take 1 capsule (300 mg total) by mouth at bedtime. 90 capsule 4  . ibuprofen (ADVIL) 800 MG tablet Take 1 tablet (800 mg total) by mouth every 8 (eight) hours as needed for moderate pain. For AFTER surgery 30 tablet 0  . senna-docusate (SENOKOT-S) 8.6-50 MG tablet Take 2 tablets by mouth at bedtime. For AFTER surgery, do not take if having diarrhea 30 tablet 0    ALLERGIES:  Allergies  Allergen Reactions  . Gluten Meal Hives    Joint pain, upset stomach  . Sulfa Antibiotics     Unknown reaction "  I was told I had a reaction when I was a baby "     FAMILY HISTORY:  Family History  Problem Relation Age of Onset  . Lung cancer Mother 34       smoker  . Ovarian cancer Mother 31  . Colon cancer Maternal Aunt        65s  . Bladder Cancer Paternal Grandfather        death was unrelated  . Cancer Paternal Aunt        through [place], blood cancer     SOCIAL HISTORY:    Social Connections:   . Frequency of Communication with Friends and Family:   . Frequency of Social Gatherings with Friends and Family:   . Attends Religious Services:   . Active Member of Clubs or Organizations:   . Attends Archivist Meetings:   Marland Kitchen Marital Status:     REVIEW OF SYSTEMS:  Pertinent positives as per HPI. Denies appetite changes, fevers, chills, unexplained weight changes. Denies hearing  loss, neck lumps or masses, mouth sores, ringing in ears or voice changes. Denies cough or wheezing.  Denies shortness of breath. Denies chest pain or palpitations. Denies leg swelling. Denies abdominal distention, pain, blood in stools, constipation, diarrhea, nausea, vomiting, or early satiety. Denies pain with intercourse, dysuria, frequency, hematuria or incontinence. Denies pelvic pain, vaginal bleedingor vaginal discharge.  Denies joint pain, back pain or muscle pain/cramps. Denies itching, rash, or wounds. Denies dizziness, headaches, numbness or seizures. Denies swollen lymph nodes or glands, denies easy bruising or bleeding. Denies anxiety, depression, confusion, or decreased concentration.  Physical Exam:  Vital Signs for this encounter:  There were no vitals taken for this visit. There is no height or weight on file to calculate BMI. General: Alert, oriented, no acute distress.  HEENT: Normocephalic, atraumatic. Sclera anicteric.  Chest: Clear to auscultation bilaterally. No wheezes, rhonchi, or rales. Cardiovascular: Regular rate and rhythm, no murmurs, rubs, or gallops.   Exam from 3/10 Abdomen: Normoactive bowel sounds. Soft, nondistended, nontender to palpation. No masses or hepatosplenomegaly appreciated. No palpable fluid wave.  Extremities: Grossly normal range of motion. Warm, well perfused. No edema bilaterally.  Skin: No rashes or lesions.  Lymphatics: No cervical, supraclavicular, or inguinal adenopathy.  GU:      Normal external female genitalia.No lesions. No discharge or bleeding. Bladder/urethra: No lesions or masses, well supported bladder Vagina: Well rugated vaginal mucosa, no lesions or masses. Cervix: Normal appearing, no lesions, quite posterior. Uterus: Small, mobile, no parametrial involvement or nodularity.  Very anteverted. Adnexa: No masses appreciated.             Rectal:  Deferred.  LABORATORY AND RADIOLOGIC DATA:  Outside medical records were reviewed to synthesize the above history, along with the history and physical obtained during the visit.   Lab Results  Component Value Date   WBC 5.4 10/29/2019   HGB 12.7 10/29/2019   HCT 39.6 10/29/2019   PLT 429 (H) 10/29/2019   GLUCOSE 79 10/29/2019   ALT 50 (H) 08/01/2019   AST 25 08/01/2019   NA 141 10/29/2019   K 4.1 10/29/2019   CL 105 10/29/2019   CREATININE 0.61 10/29/2019   BUN 11 10/29/2019   CO2 27 10/29/2019   INR 0.9 08/09/2019    Assessment/Plan:  44 year old with estrogen receptor positive breast cancer finishing adjuvant treatment and about to start antiestrogen therapy here to discuss therapeutic BSO in the setting of her personal history as well as first-degree  relative with ovarian cancer. Plan for robotic BSO today.   Jeral Pinch, MD  Division of Gynecologic Oncology

## 2019-11-05 NOTE — Discharge Instructions (Addendum)
11/05/2019  Return to work: 2-4 weeks if applicable  Activity: 1. Be up and out of the bed during the day.  Take a nap if needed.  You may walk up steps but be careful and use the hand rail.  Stair climbing will tire you more than you think, you may need to stop part way and rest.   2. No lifting or straining for 6 weeks.  3. No driving for 1 week(s).  Do not drive if you are taking narcotic pain medicine.  4. Shower daily.  Use soap and water on your incision and pat dry; don't rub.  No tub baths until cleared by your surgeon.   5. No sexual activity and nothing in the vagina for 2 weeks.  6. You may experience a small amount of clear drainage from your incisions, which is normal.  If the drainage persists or increases, please call the office.  7. You may experience vaginal spotting after surgery. The spotting is normal but if you experience heavy bleeding, call our office.  8. Take Tylenol or ibuprofen first for pain and only use narcotic pain medication for severe pain not relieved by the Tylenol or Ibuprofen.  Monitor your Tylenol intake to a max of 4,000 mg.  Diet: 1. Low sodium Heart Healthy Diet is recommended.  2. It is safe to use a laxative, such as Miralax or Colace, if you have difficulty moving your bowels. You can take Sennakot at bedtime every evening to keep bowel movements regular and to prevent constipation.    Wound Care: 1. Keep clean and dry.  Shower daily.  Reasons to call the Doctor:  Fever - Oral temperature greater than 100.4 degrees Fahrenheit  Foul-smelling vaginal discharge  Difficulty urinating  Nausea and vomiting  Increased pain at the site of the incision that is unrelieved with pain medicine.  Difficulty breathing with or without chest pain  New calf pain especially if only on one side  Sudden, continuing increased vaginal bleeding with or without clots.   Contacts: For questions or concerns you should contact:  Dr. Jeral Pinch at  [phone]  Joylene John, NP at [phone]  After Hours: call [phone] and have the GYN Oncologist paged/contacted   Post Anesthesia Home Care Instructions  Activity: Get plenty of rest for the remainder of the day. A responsible individual must stay with you for 24 hours following the procedure.  For the next 24 hours, DO NOT: -Drive a car -Paediatric nurse -Drink alcoholic beverages -Take any medication unless instructed by your physician -Make any legal decisions or sign important papers.  Meals: Start with liquid foods such as gelatin or soup. Progress to regular foods as tolerated. Avoid greasy, spicy, heavy foods. If nausea and/or vomiting occur, drink only clear liquids until the nausea and/or vomiting subsides. Call your physician if vomiting continues.  Special Instructions/Symptoms: Your throat may feel dry or sore from the anesthesia or the breathing tube placed in your throat during surgery. If this causes discomfort, gargle with warm salt water. The discomfort should disappear within 24 hours.  If you had a scopolamine patch placed behind your ear for the management of post- operative nausea and/or vomiting:  1. The medication in the patch is effective for 72 hours, after which it should be removed.  Wrap patch in a tissue and discard in the trash. Wash hands thoroughly with soap and water. 2. You may remove the patch earlier than 72 hours if you experience unpleasant side effects which may  include dry mouth, dizziness or visual disturbances. 3. Avoid touching the patch. Wash your hands with soap and water after contact with the patch.  NO IBUPROFEN, MOTRIN, ALEVE, OR ADVIL UNTIL AFTER 11:00PM TONIGHT IF NEEDED.

## 2019-11-05 NOTE — Anesthesia Procedure Notes (Signed)
Procedure Name: Intubation Date/Time: 11/05/2019 1:58 PM Performed by: Wanita Chamberlain, CRNA Pre-anesthesia Checklist: Patient identified, Emergency Drugs available, Suction available and Patient being monitored Patient Re-evaluated:Patient Re-evaluated prior to induction Oxygen Delivery Method: Circle system utilized Preoxygenation: Pre-oxygenation with 100% oxygen Induction Type: IV induction Ventilation: Mask ventilation without difficulty Laryngoscope Size: Mac and 3 Grade View: Grade I Tube type: Oral Tube size: 7.0 mm Number of attempts: 1 Airway Equipment and Method: Stylet Placement Confirmation: breath sounds checked- equal and bilateral,  CO2 detector,  positive ETCO2 and ETT inserted through vocal cords under direct vision Secured at: 21 cm Tube secured with: Tape Dental Injury: Teeth and Oropharynx as per pre-operative assessment

## 2019-11-05 NOTE — Anesthesia Preprocedure Evaluation (Addendum)
Anesthesia Evaluation  Patient identified by MRN, date of birth, ID band Patient awake    Reviewed: Allergy & Precautions, NPO status , Patient's Chart, lab work & pertinent test results  Airway Mallampati: II  TM Distance: >3 FB     Dental  (+) Dental Advisory Given, Teeth Intact   Pulmonary neg pulmonary ROS,    breath sounds clear to auscultation       Cardiovascular negative cardio ROS   Rhythm:Regular Rate:Normal     Neuro/Psych negative neurological ROS     GI/Hepatic negative GI ROS, Neg liver ROS,   Endo/Other  negative endocrine ROS  Renal/GU negative Renal ROS     Musculoskeletal   Abdominal   Peds  Hematology negative hematology ROS (+)   Anesthesia Other Findings   Reproductive/Obstetrics                            Lab Results  Component Value Date   WBC 5.4 10/29/2019   HGB 12.7 10/29/2019   HCT 39.6 10/29/2019   MCV 93.6 10/29/2019   PLT 429 (H) 10/29/2019   Lab Results  Component Value Date   CREATININE 0.61 10/29/2019   BUN 11 10/29/2019   NA 141 10/29/2019   K 4.1 10/29/2019   CL 105 10/29/2019   CO2 27 10/29/2019    Anesthesia Physical Anesthesia Plan  ASA: II  Anesthesia Plan: General   Post-op Pain Management:    Induction: Intravenous  PONV Risk Score and Plan: 3 and Dexamethasone, Ondansetron and Treatment may vary due to age or medical condition  Airway Management Planned: Oral ETT  Additional Equipment:   Intra-op Plan:   Post-operative Plan: Extubation in OR  Informed Consent: I have reviewed the patients History and Physical, chart, labs and discussed the procedure including the risks, benefits and alternatives for the proposed anesthesia with the patient or authorized representative who has indicated his/her understanding and acceptance.     Dental advisory given  Plan Discussed with: CRNA  Anesthesia Plan Comments:          Anesthesia Quick Evaluation

## 2019-11-05 NOTE — Op Note (Signed)
OPERATIVE NOTE  Pre-operative Diagnosis: ER+ breast cancer  Post-operative Diagnosis: same  Operation: Robotic-assisted laparoscopic bilateral salpingoophorectomy  Surgeon: Jeral Pinch MD  Assistant Surgeon: Joylene John NP  Anesthesia: GET  Urine Output: 100cc  Operative Findings: On EUA, small mobile uterus, normal cervix. On intra-abdominal entry, normal upper abdominal survey, omentum, small and large bowel. Physiologic adhesions of the sigmoid mesentery to the left sidewall and IP ligament. Uterus 8cm and normal in appearance. Normal appearing adnexa. No pelvic free fluid or intra-abdominal evidence of disease.  Estimated Blood Loss:  less than 50 mL      Total IV Fluids: 1,000 ml         Specimens: bilateral tubes and ovaries         Complications:  None apparent; patient tolerated the procedure well.         Disposition: PACU - hemodynamically stable.  Procedure Details  The patient was seen in the Holding Room. The risks, benefits, complications, treatment options, and expected outcomes were discussed with the patient.  The patient concurred with the proposed plan, giving informed consent.  The site of surgery properly noted/marked. The patient was identified as Tonya Whitehead and the procedure verified as a Robotic-assisted bilateral salpingo oophorectomy.   After induction of anesthesia, the patient was draped and prepped in the usual sterile manner. Patient was placed in supine position after anesthesia and draped and prepped in the usual sterile manner as follows: Her arms were tucked to her side with all appropriate precautions.  The shoulders were stabilized with padded shoulder blocks applied to the acromium processes.  The patient was placed in the semi-lithotomy position in [place].  The perineum and vagina were prepped with CholoraPrep. The patient was draped after the CholoraPrep had been allowed to dry for 3 minutes.  A Time Out was held and the  above information confirmed.  The urethra was prepped with Betadine. Foley catheter was placed.  A sterile speculum was placed in the vagina.  The cervix was grasped with a single-tooth tenaculum. The cervix was dilated with Kennon Rounds dilators.  The ZUMI uterine manipulator  was placed without difficulty.  OG tube placement was confirmed and to suction.   Next, a 5 mm skin incision was made 1 cm below the subcostal margin in the midclavicular line.  The 5 mm Optiview port and scope was used for direct entry.  Opening pressure was under 10 mm CO2.  The abdomen was insufflated and the findings were noted as above.   At this point and all points during the procedure, the patient's intra-abdominal pressure did not exceed 15 mmHg. Next, an 8 mm skin incision was made superior to the umbilicus and a right and left port were placed about 8 cm lateral to the robot port on the right and left side.  All ports were placed under direct visualization.  The patient was placed in steep Trendelenburg.  Bowel was folded away into the upper abdomen.  The robot was docked in the normal manner.  The right and left peritoneum were opened parallel to the IP ligament to open the retroperitoneal spaces bilaterally, preserving the round ligaments. The ureter was noted to be on the medial leaf of the broad ligament.  The peritoneum above the ureter was incised and stretched and the infundibulopelvic ligament was skeletonized, cauterized and cut. The peritoneum was cauterized and transected along the broad ligament to until just lateral to the uterine cornua. The fallopian tube and utero-ovarian ligaments were cauterized and  transected, freeing the adnexa bilaterally.   Irrigation was used and excellent hemostasis was achieved. Both adnexa were placed in a 5 mm Endocatch bag inserted through the right lateral robotic arm. At this point in the procedure was completed.  Robotic instruments were removed under direct visulaization.  The robot  was undocked. The bag was brought to the skin surface and the adnexa were removed in piecemeal fashion from the bag.   The subcuticular tissue was closed with 4-0 Vicryl and the skin was closed with 4-0 Monocryl in a subcuticular manner.  Dermabond was applied.    The vagina was swabbed with minimal bleeding noted.   All sponge, lap and needle counts were correct x  3.   The patient was transferred to the recovery room in stable condition.  Jeral Pinch, MD

## 2019-11-06 ENCOUNTER — Telehealth: Payer: Self-pay

## 2019-11-06 NOTE — Telephone Encounter (Signed)
Ms Tobey states that she is eating, drinking, and urinating well. She is passing gas.  She is using Miralax BID for bowels. Suggested that she take 1 dose of the 2 tabs of senokot-s as this helps soften the stool as well and stimulate the bowel. Afebrile. Incisions are D&I. There is some bruising around the incisions. Tylenol and Ibuprofen effective today for pain control. Will use Oxycodone prn. She is aware of her post op appointment on 11-20-19 at 1330 and the office number to call if she has any questions or concerns prior to her post op visit.

## 2019-11-06 NOTE — Telephone Encounter (Signed)
LM for Tonya Whitehead to call back to the office to discuss how she is doing postoperatively.

## 2019-11-06 NOTE — Anesthesia Postprocedure Evaluation (Signed)
Anesthesia Post Note  Patient: Tonya Whitehead  Procedure(s) Performed: XI ROBOTIC ASSISTED SALPINGO OOPHORECTOMY (Bilateral Abdomen)     Patient location during evaluation: PACU Anesthesia Type: General Level of consciousness: sedated and patient cooperative Pain management: pain level controlled Vital Signs Assessment: post-procedure vital signs reviewed and stable Respiratory status: spontaneous breathing Cardiovascular status: stable Anesthetic complications: no    Last Vitals:  Vitals:   11/05/19 1645 11/05/19 1717  BP: 113/72 121/75  Pulse: 62 (!) 50  Resp: 15 17  Temp: (!) 36.4 C   SpO2: 98% 100%    Last Pain:  Vitals:   11/06/19 0952  TempSrc:   PainSc: [place]

## 2019-11-06 NOTE — Transfer of Care (Cosign Needed)
Immediate Anesthesia Transfer of Care Note  Patient: Tonya Whitehead  Procedure(s) Performed: XI ROBOTIC ASSISTED SALPINGO OOPHORECTOMY (Bilateral Abdomen)  Patient Location: PACU  Anesthesia Type:General  Level of Consciousness: awake, alert , oriented and patient cooperative  Airway & Oxygen Therapy: Patient Spontanous Breathing and Patient connected to nasal cannula oxygen  Post-op Assessment: Report given to RN and Post -op Vital signs reviewed and stable  Post vital signs: Reviewed and stable  Last Vitals:  Vitals Value Taken Time  BP 121/75 11/05/19 1717  Temp 36.4 C 11/05/19 1645  Pulse 50 11/05/19 1717  Resp 17 11/05/19 1717  SpO2 100 % 11/05/19 1717    Last Pain:  Vitals:   11/05/19 1702  TempSrc:   PainSc: 4          Complications: No apparent anesthesia complications

## 2019-11-07 ENCOUNTER — Telehealth: Payer: Self-pay | Admitting: Gynecologic Oncology

## 2019-11-07 LAB — SURGICAL PATHOLOGY

## 2019-11-07 NOTE — Telephone Encounter (Signed)
Called with pathology results. Doing well post-op. Happy with news.  Jeral Pinch MD Gynecologic Oncology

## 2019-11-15 ENCOUNTER — Telehealth: Payer: Self-pay | Admitting: Radiation Oncology

## 2019-11-15 NOTE — Telephone Encounter (Signed)
  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  Interval Since Last Radiation:  5 weeks   08/30/19-10/15/19: The left chest wall and regional nodes were treated to 50.4 Gy in 28 fractions followed by a 10 Gy boost in 5 fractions.   Narrative:  The patient was contacted today for routine follow-up. During treatment she did very well with radiotherapy and did not have significant desquamation.   Impression/Plan: 1. 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 I left a message but discussed that we would be happy to continue to follow her as needed, but she will also continue to follow up with Dr. Jana Hakim in medical oncology. She was counseled on contacting me to review skin care as well as measures to avoid sun exposure to this area.      Carola Rhine, PAC

## 2019-11-19 NOTE — Progress Notes (Signed)
Gynecologic Oncology Return Clinic Visit  4/27  Reason for Visit: post-op follow-up  Treatment History: Oncology History  Malignant neoplasm of overlapping sites of left breast in female, estrogen receptor positive ([place])  12/13/2018 Initial Diagnosis   Malignant neoplasm of overlapping sites of left breast in female, estrogen receptor positive ([place])   01/16/2019 Cancer Staging   Staging form: Breast, AJCC 8th Edition - Pathologic stage from 01/16/2019: Stage IB (pT3, pN1a, cM0, G2, ER+, PR+, HER2-) - Signed by Gardenia Phlegm, NP on 05/09/2019   03/13/2019 -  Chemotherapy   The patient had DOXOrubicin (ADRIAMYCIN) chemo injection 104 mg, 60 mg/m2 = 104 mg, Intravenous,  Once, 4 of 4 cycles Administration: 104 mg (03/13/2019), 104 mg (03/27/2019), 104 mg (04/10/2019), 104 mg (04/24/2019) palonosetron (ALOXI) injection 0.25 mg, 0.25 mg, Intravenous,  Once, 16 of 16 cycles Administration: 0.25 mg (03/13/2019), 0.25 mg (03/27/2019), 0.25 mg (04/10/2019), 0.25 mg (04/24/2019), 0.25 mg (05/09/2019), 0.25 mg (05/23/2019), 0.25 mg (05/30/2019), 0.25 mg (06/06/2019), 0.25 mg (06/13/2019), 0.25 mg (06/20/2019), 0.25 mg (06/27/2019), 0.25 mg (07/04/2019), 0.25 mg (07/11/2019), 0.25 mg (07/18/2019), 0.25 mg (07/25/2019), 0.25 mg (08/01/2019) pegfilgrastim (NEULASTA ONPRO KIT) injection 6 mg, 6 mg, Subcutaneous, Once, 4 of 4 cycles Administration: 6 mg (03/13/2019), 6 mg (03/27/2019), 6 mg (04/10/2019), 6 mg (04/24/2019) cyclophosphamide (CYTOXAN) 1,040 mg in sodium chloride 0.9 % 250 mL chemo infusion, 600 mg/m2 = 1,040 mg, Intravenous,  Once, 4 of 4 cycles Administration: 1,040 mg (03/13/2019), 1,040 mg (03/27/2019), 1,040 mg (04/10/2019), 1,040 mg (04/24/2019) PACLitaxel (TAXOL) 138 mg in sodium chloride 0.9 % 250 mL chemo infusion (</= 52m/m2), 80 mg/m2 = 138 mg, Intravenous,  Once, 12 of 12 cycles Dose modification: 60 mg/m2 (original dose 80 mg/m2, Cycle 8, Reason: Provider Judgment), 72 mg/m2 (original dose 72  mg/m2, Cycle 7) Administration: 138 mg (05/09/2019), 102 mg (05/30/2019), 102 mg (06/06/2019), 102 mg (06/13/2019), 102 mg (06/20/2019), 102 mg (06/27/2019), 102 mg (07/04/2019), 102 mg (07/11/2019), 102 mg (07/18/2019), 102 mg (07/25/2019), 102 mg (08/01/2019) fosaprepitant (EMEND) 150 mg, dexamethasone (DECADRON) 12 mg in sodium chloride 0.9 % 145 mL IVPB, , Intravenous,  Once, 4 of 4 cycles Administration:  (03/13/2019),  (03/27/2019),  (04/10/2019),  (04/24/2019)  for chemotherapy treatment.      Interval History: Patient reports overall doing very well since surgery.  She denies any abdominal pain.  She reports having a good appetite without nausea or emesis.  She had vaginal bleeding for about 1 week after surgery but denies any vaginal bleeding or discharge since.  She initially had some constipation and then stopped the gabapentin she was on for hot flashes about a week ago with resolution of her constipation.  She denies any urinary symptoms.  She denies any fevers or chills.  She reports that since surgery, she is sleeping well and her flashes have improved.  Past Medical/Surgical History: Past Medical History:  Diagnosis Date  . [place]   invasive lobular  . Family history of bladder cancer   . Family history of colon cancer   . Family history of lung cancer   . Family history of ovarian cancer     Past Surgical History:  Procedure Laterality Date  . ADENOIDECTOMY     as a child  . BREAST EXCISIONAL BIOPSY Left 2002   reconstructive surgery on nipple to match right breast  . IR IMAGING GUIDED PORT INSERTION  03/08/2019  . IR REMOVAL TUN ACCESS W/ PORT W/O FL MOD SED  08/09/2019  . ROBOTIC  ASSISTED SALPINGO OOPHERECTOMY Bilateral 11/05/2019   Procedure: XI ROBOTIC ASSISTED SALPINGO OOPHORECTOMY;  Surgeon: Lafonda Mosses, MD;  Location: [facility];  Service: Gynecology;  Laterality: Bilateral;    Family History  Problem Relation Age of Onset   . Lung cancer Mother 31       smoker  . Ovarian cancer Mother 75  . Colon cancer Maternal Aunt        75s  . Bladder Cancer Paternal Grandfather        death was unrelated  . Cancer Paternal Aunt        through [place], blood cancer    Social History   Socioeconomic History  . Marital status: Married    Spouse name: [name]  . Number of children: 3  . Years of education: Not on file  . Highest education level: Not on file  Occupational History  . Not on file  Tobacco Use  . Smoking status: Never Smoker  . Smokeless tobacco: Never Used  Substance and Sexual Activity  . Alcohol use: Yes    Comment: rarely  . Drug use: Never  . Sexual activity: Yes    Birth control/protection: Other-see comments    Comment: husband had vasectomy  Other Topics Concern  . Not on file  Social History Narrative  . Not on file   Social Determinants of Health   Financial Resource Strain:   . Difficulty of Paying Living Expenses:   Food Insecurity:   . Worried About Charity fundraiser in the Last Year:   . Arboriculturist in the Last Year:   Transportation Needs:   . Film/video editor (Medical):   Marland Kitchen Lack of Transportation (Non-Medical):   Physical Activity:   . Days of Exercise per Week:   . Minutes of Exercise per Session:   Stress:   . Feeling of Stress :   Social Connections:   . Frequency of Communication with Friends and Family:   . Frequency of Social Gatherings with Friends and Family:   . Attends Religious Services:   . Active Member of Clubs or Organizations:   . Attends Archivist Meetings:   Marland Kitchen Marital Status:     Current Medications:  Current Outpatient Medications:  .  coconut oil OIL, Apply 1 application topically as needed (radiation)., Disp: , Rfl:   Review of Systems: Denies appetite changes, fevers, chills, fatigue, unexplained weight changes. Denies hearing loss, neck lumps or masses, mouth sores, ringing in ears or voice changes. Denies cough or  wheezing.  Denies shortness of breath. Denies chest pain or palpitations. Denies leg swelling. Denies abdominal distention, pain, blood in stools, constipation, diarrhea, nausea, vomiting, or early satiety. Denies pain with intercourse, dysuria, frequency, hematuria or incontinence. Denies hot flashes, pelvic pain, vaginal bleeding or vaginal discharge.   Denies joint pain, back pain or muscle pain/cramps. Denies itching, rash, or wounds. Denies dizziness, headaches, numbness or seizures. Denies swollen lymph nodes or glands, denies easy bruising or bleeding. Denies anxiety, depression, confusion, or decreased concentration.  Physical Exam: BP 99/63 (BP Location: Right Arm, Patient Position: Sitting)   Pulse 87   Temp 99.1 F (37.3 C) (Temporal)   Resp 17   Ht '5\' 4"'  (1.626 m)   Wt 141 lb 9.6 oz (64.2 kg)   SpO2 98%   BMI 24.31 kg/m  General: Alert, oriented, no acute distress. HEENT: Normocephalic, atraumatic, sclera anicteric. Chest: Unlabored breathing on room air. Abdomen: soft, nontender.  Normoactive bowel  sounds.  No masses or hepatosplenomegaly appreciated.  Well-healing laparoscopic incisions, some periincisional bruising. Extremities: Grossly normal range of motion.  Warm, well perfused.  No edema bilaterally. Skin: No rashes or lesions noted.  Laboratory & Radiologic Studies: Pathology from 4/12: A. FALLOPIAN TUBES AND OVARIES, BILATERAL, SALPINGO OOPHPORECTOMY:  - Right and left ovaries:    Benign follicular cyst.    No endometriosis or evidence of malignancy.   - Right and left fallopian tubes:    Unremarkable.    No endometriosis or evidence of malignancy.  Assessment & Plan: Tonya Whitehead is a 44 y.o. woman with estrogen receptor positive breast cancer now status post therapeutic BSO.  Patient is overall doing very well and healing well from surgery.  She has had some improvement of her menopausal symptoms.  We discussed that these may worsen and  that she should stay in touch with her medical oncologist or myself if this is the case.  She has stopped the gabapentin in the postop period because of constipation, with no worsening of her hot flashes.  I encouraged her to continue following with her gynecologist for her regular well woman needs and health maintenance.  15 minutes of total time was spent for this patient encounter, including preparation, face-to-face counseling with the patient and coordination of care, and documentation of the encounter.  Jeral Pinch, MD  Division of Gynecologic Oncology  Department of Obstetrics and Gynecology  [facility]

## 2019-11-20 ENCOUNTER — Inpatient Hospital Stay: Payer: 59 | Attending: Medical | Admitting: Gynecologic Oncology

## 2019-11-20 ENCOUNTER — Encounter: Payer: Self-pay | Admitting: Gynecologic Oncology

## 2019-11-20 ENCOUNTER — Other Ambulatory Visit: Payer: Self-pay

## 2019-11-20 VITALS — BP 99/63 | HR 87 | Temp 99.1°F | Resp 17 | Ht 64.0 in | Wt 141.6 lb

## 2019-11-20 DIAGNOSIS — Z1502 Genetic susceptibility to malignant neoplasm of ovary: Secondary | ICD-10-CM

## 2019-11-20 DIAGNOSIS — Z170 Estrogen receptor positive status [ER+]: Secondary | ICD-10-CM | POA: Insufficient documentation

## 2019-11-20 DIAGNOSIS — C50412 Malignant neoplasm of upper-outer quadrant of left female breast: Secondary | ICD-10-CM | POA: Insufficient documentation

## 2019-11-20 DIAGNOSIS — Z90722 Acquired absence of ovaries, bilateral: Secondary | ICD-10-CM | POA: Insufficient documentation

## 2019-11-20 NOTE — Patient Instructions (Signed)
You are healing well from surgery!  Please contact me if you need anything in the future or have any concerns.  You can call the office at [phone].

## 2019-11-23 DIAGNOSIS — L821 Other seborrheic keratosis: Secondary | ICD-10-CM | POA: Diagnosis not present

## 2019-11-23 DIAGNOSIS — L905 Scar conditions and fibrosis of skin: Secondary | ICD-10-CM | POA: Diagnosis not present

## 2019-11-23 DIAGNOSIS — L820 Inflamed seborrheic keratosis: Secondary | ICD-10-CM | POA: Diagnosis not present

## 2019-11-23 DIAGNOSIS — Z85828 Personal history of other malignant neoplasm of skin: Secondary | ICD-10-CM | POA: Diagnosis not present

## 2019-11-23 DIAGNOSIS — D1801 Hemangioma of skin and subcutaneous tissue: Secondary | ICD-10-CM | POA: Diagnosis not present

## 2019-11-23 DIAGNOSIS — D225 Melanocytic nevi of trunk: Secondary | ICD-10-CM | POA: Diagnosis not present

## 2019-11-27 NOTE — Progress Notes (Signed)
  Radiation Oncology         [phone] ________________________________  Name: Tonya Whitehead MRN: [id number]  Date: 10/15/2019  DOB: 10-Aug-1975  End of Treatment Note  Diagnosis:   left-sided breast cancer     Indication for treatment:  Curative       Radiation treatment dates:   08/30/19 - 10/15/19  Site/dose:   The patient initially received a dose of 50.4 Gy in 28 fractions to the chest wall and supraclavicular region. This was delivered using a 3-D conformal, 4 field technique. The patient then received a boost to the mastectomy scar. This delivered an additional 10 Gy in 5 fractions using an en face electron field. The total dose was 60.4 Gy.  Narrative: The patient tolerated radiation treatment relatively well.   The patient had some expected skin irritation as she progressed during treatment. Moist desquamation was not present at the end of treatment.  Plan: The patient has completed radiation treatment. The patient will return to radiation oncology clinic for routine followup in one month. I advised the patient to call or return sooner if they have any questions or concerns related to their recovery or treatment. ________________________________  Jodelle Gross, M.D., Ph.D.

## 2019-12-05 DIAGNOSIS — Z853 Personal history of malignant neoplasm of breast: Secondary | ICD-10-CM | POA: Diagnosis not present

## 2019-12-05 DIAGNOSIS — Z20822 Contact with and (suspected) exposure to covid-19: Secondary | ICD-10-CM | POA: Diagnosis not present

## 2019-12-05 DIAGNOSIS — Z01818 Encounter for other preprocedural examination: Secondary | ICD-10-CM | POA: Diagnosis not present

## 2019-12-05 DIAGNOSIS — Z79899 Other long term (current) drug therapy: Secondary | ICD-10-CM | POA: Diagnosis not present

## 2019-12-05 DIAGNOSIS — Z9221 Personal history of antineoplastic chemotherapy: Secondary | ICD-10-CM | POA: Diagnosis not present

## 2019-12-05 DIAGNOSIS — Z85828 Personal history of other malignant neoplasm of skin: Secondary | ICD-10-CM | POA: Diagnosis not present

## 2019-12-05 DIAGNOSIS — Z923 Personal history of irradiation: Secondary | ICD-10-CM | POA: Diagnosis not present

## 2019-12-05 DIAGNOSIS — G8929 Other chronic pain: Secondary | ICD-10-CM | POA: Diagnosis not present

## 2019-12-05 DIAGNOSIS — E042 Nontoxic multinodular goiter: Secondary | ICD-10-CM | POA: Diagnosis not present

## 2020-01-14 DIAGNOSIS — E890 Postprocedural hypothyroidism: Secondary | ICD-10-CM | POA: Diagnosis not present

## 2020-01-14 DIAGNOSIS — M2550 Pain in unspecified joint: Secondary | ICD-10-CM | POA: Diagnosis not present

## 2020-01-14 DIAGNOSIS — Z9189 Other specified personal risk factors, not elsewhere classified: Secondary | ICD-10-CM | POA: Diagnosis not present

## 2020-01-14 DIAGNOSIS — M545 Low back pain: Secondary | ICD-10-CM | POA: Diagnosis not present

## 2020-01-16 ENCOUNTER — Inpatient Hospital Stay: Payer: 59 | Attending: Medical | Admitting: Adult Health

## 2020-01-16 ENCOUNTER — Other Ambulatory Visit: Payer: Self-pay

## 2020-01-16 VITALS — BP 109/81 | HR 81 | Temp 97.3°F | Resp 20 | Ht 64.0 in | Wt 140.9 lb

## 2020-01-16 DIAGNOSIS — Z79810 Long term (current) use of selective estrogen receptor modulators (SERMs): Secondary | ICD-10-CM | POA: Diagnosis not present

## 2020-01-16 DIAGNOSIS — C50412 Malignant neoplasm of upper-outer quadrant of left female breast: Secondary | ICD-10-CM | POA: Insufficient documentation

## 2020-01-16 DIAGNOSIS — Z170 Estrogen receptor positive status [ER+]: Secondary | ICD-10-CM

## 2020-01-16 DIAGNOSIS — Z9221 Personal history of antineoplastic chemotherapy: Secondary | ICD-10-CM | POA: Diagnosis not present

## 2020-01-16 DIAGNOSIS — Z90722 Acquired absence of ovaries, bilateral: Secondary | ICD-10-CM | POA: Insufficient documentation

## 2020-01-16 DIAGNOSIS — Z923 Personal history of irradiation: Secondary | ICD-10-CM | POA: Insufficient documentation

## 2020-01-16 DIAGNOSIS — Z1502 Genetic susceptibility to malignant neoplasm of ovary: Secondary | ICD-10-CM | POA: Diagnosis not present

## 2020-01-16 DIAGNOSIS — C50812 Malignant neoplasm of overlapping sites of left female breast: Secondary | ICD-10-CM

## 2020-01-16 NOTE — Progress Notes (Signed)
SURVIVORSHIP VISIT:   BRIEF ONCOLOGIC HISTORY:  Oncology History  Malignant neoplasm of overlapping sites of left breast in female, estrogen receptor positive ([place])  12/08/2018 Surgery   Left breast needle biopsy [facility]) [id number]): 1. Breast, left, needle core biopsy, 2 o'clock: invasive mammary carcinoma with mammary carcinoma in situ, grade 2 2. Breast, left, needle core biopsy, 6 o'clock: invasive mammary carcinoma with mammary carcinoma in situ, grade 2   12/13/2018 Initial Diagnosis   Malignant neoplasm of overlapping sites of left breast in female, estrogen receptor positive ([place])   01/10/2019 Genetic Testing   Genetic testing reported out on 01/09/2019 through the [facility] STAT + Common Hereditary cancer panel found no pathogenic mutations.   The STAT Breast cancer panel offered by [facility] includes sequencing and rearrangement analysis for the following 9 genes:  ATM, BRCA1, BRCA2, CDH1, CHEK2, PALB2, PTEN, STK11 and TP53.     The Common Hereditary Cancers Panel offered by [facility] includes sequencing and/or deletion duplication testing of the following 47 genes: APC, ATM, AXIN2, BARD1, BMPR1A, BRCA1, BRCA2, BRIP1, CDH1, CDKN2A (p14ARF), CDKN2A (p16INK4a), CKD4, CHEK2, CTNNA1, DICER1, EPCAM (Deletion/duplication testing only), GREM1 (promoter region deletion/duplication testing only), KIT, MEN1, MLH1, MSH2, MSH3, MSH6, MUTYH, NBN, NF1, NHTL1, PALB2, PDGFRA, PMS2, POLD1, POLE, PTEN, RAD50, RAD51C, RAD51D,SDHB, SDHC, SDHD, SMAD4, SMARCA4. STK11, TP53, TSC1, TSC2, and VHL.  The following genes were evaluated for sequence changes only: SDHA and HOXB13 c.251G>A variant only.   01/16/2019 Cancer Staging   Staging form: Breast, AJCC 8th Edition - Pathologic stage from 01/16/2019: Stage IB (pT3, pN1a, cM0, G2, ER+, PR+, HER2-)   01/16/2019 Surgery   left skin sparing mastectomy at [facility]: mpT3 pN1, stage IIA invasive lobular carcinoma, grade 2; ER/PR+, HER-2 nonamplified. 1/8 lymph nodes positive  for carcinoma.   03/13/2019 - 08/01/2019 Chemotherapy   DOXOrubicin (ADRIAMYCIN) chemo injection 104 mg, 60 mg/m2 = 104 mg, Intravenous,  Once, 4 of 4 cycles. Administration: 104 mg (03/13/2019), 104 mg (03/27/2019), 104 mg (04/10/2019), 104 mg (04/24/2019)  palonosetron (ALOXI) injection 0.25 mg, 0.25 mg, Intravenous,  Once, 16 of 16 cycles. Administration: 0.25 mg (03/13/2019), 0.25 mg (03/27/2019), 0.25 mg (04/10/2019), 0.25 mg (04/24/2019), 0.25 mg (05/09/2019), 0.25 mg (05/23/2019), 0.25 mg (05/30/2019), 0.25 mg (06/06/2019), 0.25 mg (06/13/2019), 0.25 mg (06/20/2019), 0.25 mg (06/27/2019), 0.25 mg (07/04/2019), 0.25 mg (07/11/2019), 0.25 mg (07/18/2019), 0.25 mg (07/25/2019), 0.25 mg (08/01/2019)  pegfilgrastim (NEULASTA ONPRO KIT) injection 6 mg, 6 mg, Subcutaneous, Once, 4 of 4 cycles. Administration: 6 mg (03/13/2019), 6 mg (03/27/2019), 6 mg (04/10/2019), 6 mg (04/24/2019)  cyclophosphamide (CYTOXAN) 1,040 mg in sodium chloride 0.9 % 250 mL chemo infusion, 600 mg/m2 = 1,040 mg, Intravenous,  Once, 4 of 4 cycles. Administration: 1,040 mg (03/13/2019), 1,040 mg (03/27/2019), 1,040 mg (04/10/2019), 1,040 mg (04/24/2019)  PACLitaxel (TAXOL) 138 mg in sodium chloride 0.9 % 250 mL chemo infusion (</= 71m/m2), 80 mg/m2 = 138 mg, Intravenous,  Once, 12 of 12 cycles. Dose modification: 60 mg/m2 (original dose 80 mg/m2, Cycle 8, Reason: Provider Judgment), 72 mg/m2 (original dose 72 mg/m2, Cycle 7). Administration: 138 mg (05/09/2019), 102 mg (05/30/2019), 102 mg (06/06/2019), 102 mg (06/13/2019), 102 mg (06/20/2019), 102 mg (06/27/2019), 102 mg (07/04/2019), 102 mg (07/11/2019), 102 mg (07/18/2019), 102 mg (07/25/2019), 102 mg (08/01/2019)  fosaprepitant (EMEND) 150 mg, dexamethasone (DECADRON) 12 mg in sodium chloride 0.9 % 145 mL IVPB, , Intravenous,  Once, 4 of 4 cycles. Administration:  (03/13/2019),  (03/27/2019),  (04/10/2019),  (04/24/2019)   08/30/2019 - 10/15/2019 Radiation Therapy  The patient initially received a dose of 50.4  Gy in 28 fractions to the chest wall and supraclavicular region. This was delivered using a 3-D conformal, 4 field technique. The patient then received a boost to the mastectomy scar. This delivered an additional 10 Gy in 5 fractions using an en face electron field. The total dose was 60.4 Gy.   11/2019 - 11/2024 Anti-estrogen oral therapy   Tamoxifen     INTERVAL HISTORY:  [facility] to review her survivorship care plan detailing her treatment course for breast cancer, as well as monitoring long-term side effects of that treatment, education regarding health maintenance, screening, and overall wellness and health promotion.     Overall, [facility] reports feeling quite well.  She denies any new issues.  She started Tamoxifen on May 5 and is doing well.  She has some mild hot flashes and occasional bone pain, but is otherwise well.  She completed survivorship survey that was normal.    REVIEW OF SYSTEMS:  Review of Systems  Constitutional: Negative for appetite change, chills, fatigue, fever and unexpected weight change.  HENT:   Negative for hearing loss and trouble swallowing.   Eyes: Negative for eye problems and icterus.  Respiratory: Negative for chest tightness, cough and shortness of breath.   Cardiovascular: Negative for chest pain, leg swelling and palpitations.  Gastrointestinal: Negative for abdominal distention, abdominal pain, constipation, diarrhea, nausea and vomiting.  Endocrine: Negative for hot flashes.  Musculoskeletal: Negative for arthralgias.  Skin: Negative for itching and rash.  Neurological: Negative for dizziness, extremity weakness, headaches and numbness.  Hematological: Negative for adenopathy. Does not bruise/bleed easily.  Psychiatric/Behavioral: Negative for depression. The patient is not nervous/anxious.   Breast: Denies any new nodularity, masses, tenderness, nipple changes, or nipple discharge.      ONCOLOGY TREATMENT TEAM:  1. Surgeon:  Dr. Glade Stanford  at [facility] 2. Medical Oncologist: Dr. Jana Hakim  3. Radiation Oncologist: Dr. Lisbeth Renshaw    PAST MEDICAL/SURGICAL HISTORY:  Past Medical History:  Diagnosis Date  . Breast cancer ([place]) 12/08/2018   invasive lobular  . Family history of bladder cancer   . Family history of colon cancer   . Family history of lung cancer   . Family history of ovarian cancer    Past Surgical History:  Procedure Laterality Date  . ADENOIDECTOMY     as a child  . BREAST EXCISIONAL BIOPSY Left 2002   reconstructive surgery on nipple to match right breast  . IR IMAGING GUIDED PORT INSERTION  03/08/2019  . IR REMOVAL TUN ACCESS W/ PORT W/O FL MOD SED  08/09/2019  . ROBOTIC ASSISTED SALPINGO OOPHERECTOMY Bilateral 11/05/2019   Procedure: XI ROBOTIC ASSISTED SALPINGO OOPHORECTOMY;  Surgeon: Lafonda Mosses, MD;  Location: [facility];  Service: Gynecology;  Laterality: Bilateral;     ALLERGIES:  Allergies  Allergen Reactions  . Gluten Meal Hives    Joint pain, upset stomach  . Skin Adhesives [Cyanoacrylate] Hives  . Sulfa Antibiotics     Unknown reaction " I was told I had a reaction when I was a baby "  . Latex Hives    Developed hives from elastic.     CURRENT MEDICATIONS:  Outpatient Encounter Medications as of 01/16/2020  Medication Sig  . coconut oil OIL Apply 1 application topically as needed (radiation).  Marland Kitchen levothyroxine (SYNTHROID) 100 MCG tablet Take by mouth.  . tamoxifen (NOLVADEX) 20 MG tablet Take by mouth.   No facility-administered encounter medications  on file as of 01/16/2020.     ONCOLOGIC FAMILY HISTORY:  Family History  Problem Relation Age of Onset  . Lung cancer Mother 60       smoker  . Ovarian cancer Mother 90  . Colon cancer Maternal Aunt        47s  . Bladder Cancer Paternal Grandfather        death was unrelated  . Cancer Paternal Aunt        through [place], blood cancer     GENETIC COUNSELING/TESTING: See above  SOCIAL  HISTORY:  Social History   Socioeconomic History  . Marital status: Married    Spouse name: [name]  . Number of children: 3  . Years of education: Not on file  . Highest education level: Not on file  Occupational History  . Not on file  Tobacco Use  . Smoking status: Never Smoker  . Smokeless tobacco: Never Used  Vaping Use  . Vaping Use: Never used  Substance and Sexual Activity  . Alcohol use: Yes    Comment: rarely  . Drug use: Never  . Sexual activity: Yes    Birth control/protection: Other-see comments    Comment: husband had vasectomy  Other Topics Concern  . Not on file  Social History Narrative  . Not on file   Social Determinants of Health   Financial Resource Strain:   . Difficulty of Paying Living Expenses:   Food Insecurity:   . Worried About Charity fundraiser in the Last Year:   . Arboriculturist in the Last Year:   Transportation Needs:   . Film/video editor (Medical):   Marland Kitchen Lack of Transportation (Non-Medical):   Physical Activity:   . Days of Exercise per Week:   . Minutes of Exercise per Session:   Stress:   . Feeling of Stress :   Social Connections:   . Frequency of Communication with Friends and Family:   . Frequency of Social Gatherings with Friends and Family:   . Attends Religious Services:   . Active Member of Clubs or Organizations:   . Attends Archivist Meetings:   Marland Kitchen Marital Status:   Intimate Partner Violence:   . Fear of Current or Ex-Partner:   . Emotionally Abused:   Marland Kitchen Physically Abused:   . Sexually Abused:      OBSERVATIONS/OBJECTIVE:  BP 109/81   Pulse 81   Temp (!) 97.3 F (36.3 C)   Resp 20   Ht '5\' 4"'  (1.626 m)   Wt 140 lb 14.4 oz (63.9 kg)   SpO2 99%   BMI 24.19 kg/m  GENERAL: Patient is a well appearing female in no acute distress HEENT:  Sclerae anicteric.  Oropharynx clear and moist. No ulcerations or evidence of oropharyngeal candidiasis. Neck is supple.  NODES:  No cervical, supraclavicular,  or axillary lymphadenopathy palpated.  BREAST EXAM:  Right breast benign, left breast s/p mastectomy, no sign of local recurrence, mild hyperpigmentation from radiation, healing quite well.   LUNGS:  Clear to auscultation bilaterally.  No wheezes or rhonchi. HEART:  Regular rate and rhythm. No murmur appreciated. ABDOMEN:  Soft, nontender.  Positive, normoactive bowel sounds. No organomegaly palpated. MSK:  No focal spinal tenderness to palpation. Full range of motion bilaterally in the upper extremities. EXTREMITIES:  No peripheral edema.   SKIN:  Clear with no obvious rashes or skin changes. No nail dyscrasia. NEURO:  Nonfocal. Well oriented.  Appropriate affect.  LABORATORY DATA:  None for this visit.  DIAGNOSTIC IMAGING:  None for this visit.      ASSESSMENT AND PLAN:  Ms.. [facility] is a pleasant 44 y.o. female with Stage IB left breast invasive ductal carcinoma, ER+/PR+/HER2-, diagnosed in 11/2018, treated with mastectomy, adjuvant chemotherapy, adjuvant radiation therapy, and anti-estrogen therapy with Anastrozole to start.  She presents to the Survivorship Clinic for our initial meeting and routine follow-up post-completion of treatment for breast cancer.    1. Stage IB left breast cancer:  [facility] is continuing to recover from definitive treatment for breast cancer. She will follow-up with her medical oncologist, Dr. Jana Hakim in 02/2020 with history and physical exam per surveillance protocol.  She is taking Tamoxifen daily and is tolerating it quite well. She should continue with annual right breast mammograms, which she has completed at [facility] with her surgeon.     Today, a comprehensive survivorship care plan and treatment summary was reviewed with the patient today detailing her breast cancer diagnosis, treatment course, potential late/long-term effects of treatment, appropriate follow-up care with recommendations for the future, and patient education resources.  A copy of  this summary, along with a letter will be sent to the patient's primary care provider via mail/fax/In Basket message after today's visit.    2. Bone health:   She was given education on specific activities to promote bone health.  She is taking Tamoxifen which helps protect her bones.  She has undergone BSO, so she will need a bone density completed sooner than age 26.    3. Cancer screening:  Due to [facility]'s history and her age, she should receive screening for skin cancers, colon cancer, and gynecologic cancers.  The information and recommendations are listed on the patient's comprehensive care plan/treatment summary and were reviewed in detail with the patient.    4. Health maintenance and wellness promotion: [facility] was encouraged to consume 5-7 servings of fruits and vegetables per day. We reviewed the "Nutrition Rainbow" handout, as well as the handout "Take Control of Your Health and Reduce Your Cancer Risk" from the [place].  She was also encouraged to engage in moderate to vigorous exerc/ise for 30 minutes per day most days of the week. We discussed the [employer] fitness program, which is designed for cancer survivors to help them become more physically fit after cancer treatments.  She was instructed to limit her alcohol consumption and continue to abstain from tobacco use.     5. Support services/counseling: It is not uncommon for this period of the patient's cancer care trajectory to be one of many emotions and stressors.  We discussed how this can be increasingly difficult during the times of quarantine and social distancing due to the COVID-19 pandemic.   She was given information regarding our available services and encouraged to contact me with any questions or for help enrolling in any of our support group/programs.    Follow up instructions:    -Return [facility] in 03[facility]2020 for f/u with Dr. Jana Hakim -Mammogram due in annually on the right breast,  completed at [facility] -She is welcome to return back to the Survivorship Clinic at any time; no additional follow-up needed at this time.  -Consider referral back to survivorship as a long-term survivor for continued surveillance  The patient was provided an opportunity to ask questions and all were answered. The patient agreed with the plan and demonstrated an understanding of the instructions.   Total encounter time: 53  minutesWilber Bihari, NP 01/19/20 5:30 AM Medical Oncology and Hematology [facility] [place], [place] [address] Tel. [phone]    Fax. [phone]  *Total Encounter Time as defined by the Centers for Medicare and Medicaid Services includes, in addition to the face-to-face time of a patient visit (documented in the note above) non-face-to-face time: obtaining and reviewing outside history, ordering and reviewing medications, tests or procedures, care coordination (communications with other health care professionals or caregivers) and documentation in the medical record.

## 2020-01-17 ENCOUNTER — Telehealth: Payer: Self-pay | Admitting: Adult Health

## 2020-01-17 NOTE — Telephone Encounter (Signed)
Scheduled appts per 6/23 los. Left voicemail with appt date and time.

## 2020-02-04 ENCOUNTER — Other Ambulatory Visit: Payer: 59

## 2020-02-04 ENCOUNTER — Ambulatory Visit: Payer: 59 | Admitting: Oncology

## 2020-02-12 DIAGNOSIS — Z170 Estrogen receptor positive status [ER+]: Secondary | ICD-10-CM | POA: Diagnosis not present

## 2020-02-12 DIAGNOSIS — Z9012 Acquired absence of left breast and nipple: Secondary | ICD-10-CM | POA: Diagnosis not present

## 2020-02-12 DIAGNOSIS — C50412 Malignant neoplasm of upper-outer quadrant of left female breast: Secondary | ICD-10-CM | POA: Diagnosis not present

## 2020-02-13 DIAGNOSIS — C50412 Malignant neoplasm of upper-outer quadrant of left female breast: Secondary | ICD-10-CM | POA: Diagnosis not present

## 2020-02-13 DIAGNOSIS — C50812 Malignant neoplasm of overlapping sites of left female breast: Secondary | ICD-10-CM | POA: Diagnosis not present

## 2020-02-13 DIAGNOSIS — Z9012 Acquired absence of left breast and nipple: Secondary | ICD-10-CM | POA: Diagnosis not present

## 2020-02-13 DIAGNOSIS — Z170 Estrogen receptor positive status [ER+]: Secondary | ICD-10-CM | POA: Diagnosis not present

## 2020-02-13 DIAGNOSIS — Z853 Personal history of malignant neoplasm of breast: Secondary | ICD-10-CM | POA: Diagnosis not present

## 2020-02-18 DIAGNOSIS — Z0000 Encounter for general adult medical examination without abnormal findings: Secondary | ICD-10-CM | POA: Diagnosis not present

## 2020-02-18 DIAGNOSIS — E890 Postprocedural hypothyroidism: Secondary | ICD-10-CM | POA: Diagnosis not present

## 2020-02-25 DIAGNOSIS — M545 Low back pain: Secondary | ICD-10-CM | POA: Diagnosis not present

## 2020-02-25 DIAGNOSIS — Z0000 Encounter for general adult medical examination without abnormal findings: Secondary | ICD-10-CM | POA: Diagnosis not present

## 2020-02-25 DIAGNOSIS — Z8719 Personal history of other diseases of the digestive system: Secondary | ICD-10-CM | POA: Diagnosis not present

## 2020-02-25 DIAGNOSIS — R7301 Impaired fasting glucose: Secondary | ICD-10-CM | POA: Diagnosis not present

## 2020-02-25 DIAGNOSIS — R768 Other specified abnormal immunological findings in serum: Secondary | ICD-10-CM | POA: Diagnosis not present

## 2020-02-25 DIAGNOSIS — Z170 Estrogen receptor positive status [ER+]: Secondary | ICD-10-CM | POA: Diagnosis not present

## 2020-02-25 DIAGNOSIS — E538 Deficiency of other specified B group vitamins: Secondary | ICD-10-CM | POA: Diagnosis not present

## 2020-02-25 DIAGNOSIS — Z8249 Family history of ischemic heart disease and other diseases of the circulatory system: Secondary | ICD-10-CM | POA: Diagnosis not present

## 2020-02-25 DIAGNOSIS — R82998 Other abnormal findings in urine: Secondary | ICD-10-CM | POA: Diagnosis not present

## 2020-02-25 DIAGNOSIS — C50912 Malignant neoplasm of unspecified site of left female breast: Secondary | ICD-10-CM | POA: Diagnosis not present

## 2020-02-25 DIAGNOSIS — E890 Postprocedural hypothyroidism: Secondary | ICD-10-CM | POA: Diagnosis not present

## 2020-03-10 ENCOUNTER — Encounter: Payer: Self-pay | Admitting: Adult Health

## 2020-04-02 DIAGNOSIS — J029 Acute pharyngitis, unspecified: Secondary | ICD-10-CM | POA: Diagnosis not present

## 2020-04-13 NOTE — Progress Notes (Signed)
[place]  [phone] [phone]     ID: Tonya Whitehead DOB: October 20, 1975  MR#: [id number]  WCB#:[id number]  Patient Care Team: Marton Redwood, MD as PCP - General (Internal Medicine) Jo Booze, Virgie Dad, MD as Consulting Physician (Oncology) Sheliah Hatch, MD as Consulting Physician (Surgical Oncology) Stann Ore, MD as Consulting Physician (Plastic Surgery) Arvella Nigh, MD as Consulting Physician (Obstetrics and Gynecology) Lafonda Mosses, MD as Consulting Physician (Gynecologic Oncology) Kyung Rudd, MD as Consulting Physician (Radiation Oncology) Lovey Newcomer, MD as Attending Physician (Radiology) Margaretmary Prisk, Virgie Dad, MD as Consulting Physician (Oncology) Chauncey Cruel, MD OTHER MD:  CHIEF COMPLAINT: 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   She completed adjuvant radiation therapy under Dr. Lisbeth Renshaw on 10/15/2019.  She underwent prophylactic bilateral salpingo-oophorectomy on 11/05/2019 under Dr. Berline Lopes. Pathology from the procedure ([id number]) showed no evidence of malignancy.  She started tamoxifen on 11/25/2019.  She also underwent thyroidectomy on 12/05/2019 under Dr. Cena Benton at [facility]. Pathology from the procedure ([id number]) showed benign nodular hyperplasia.  Finally, she underwent right diagnostic mammography with tomography at [facility] on 02/13/2020 showing: breast density of heterogeneously dense; no evidence of malignancy.    REVIEW OF SYSTEMS: Tonya Whitehead is tolerating tamoxifen generally well.  She is having some hot flashes but they are actually diminished--she has been having hot flashes since the start of chemotherapy.  She has had minimal vaginal dryness, which is actually better on tamoxifen.  She has had the Boykin vaccine x2, with good tolerance.   She tells me family is doing well.  She exercises by walking her dog.  She has not gone back to the gym yet.  Detailed review of systems today was otherwise stable     HISTORY OF CURRENT ILLNESS: From the original intake note:  Tonya Whitehead notes a history of reconstructive left breast surgery in 2002 while living in [place], [place]. That was for symmetry. The left breast accordingly had some scar tissue and she thought that was what she was noting until mid March, when she felt the left breast was changing more and the nipple seemed to be sinking subtly.  She brought this to Dr Dahlia Bailiff attention and underwent bilateral diagnostic mammography with tomography and bilateral breast ultrasonography at [facility] on 12/01/2018 showing: breast density category C; findings concerning for extensive malignancy throughout the left breast predominately involving the upper- and lower-outer quadrants.  The mass was palpable within the upper outer left breast and there was left nipple retraction as well as indentation along the inferior margin of the left breast.  Targeted ultrasound showed three masses measuring 5.6 cm (2 o'clock position), 4.0 cm (close to the nipple and contiguous to the prior mass), and 3.3 cm (6 o'clock position.).  Also noted was a borderline left axillary lymph node adjacent to the left axillary artery (and so not easy to biopsy).  In the right breast was noted a 1.3 cm cyst.  On 12/08/2018 Tonya Whitehead proceeded to biopsy of 2 of the left breast areas in question. The pathology from this procedure ([id number]) showed: invasive mammary carcinoma, grade 2, in two cores (at 2 o'clock and 6 o'clock) with identical histomorphology; e-cadherin is negative, consistent with a lobular phenotype. Prognostic indicators significant for: estrogen receptor, 70% positive and progesterone receptor, 70% positive, both with strong staining intensity. Proliferation marker Ki67 at 3%. HER2 negative by  immunohistochemistry (1+).  Note also the patient had a thyroid ultrasound 07/18/2018 showing bilateral solitary nodules, which were biopsied 08/09/2018.  Both showed follicular nodules, Bethesda category 2 (benign).  The patient's subsequent history is as detailed below.   PAST MEDICAL HISTORY: Past Medical History:  Diagnosis Date  . Breast cancer ([place]) 12/08/2018   invasive lobular  . Family history of bladder cancer   . Family history of colon cancer   . Family history of lung cancer   . Family history of ovarian cancer     PAST SURGICAL HISTORY: Past Surgical History:  Procedure Laterality Date  . ADENOIDECTOMY     as a child  . BREAST EXCISIONAL BIOPSY Left 2002   reconstructive surgery on nipple to match right breast  . IR IMAGING GUIDED PORT INSERTION  03/08/2019  . IR REMOVAL TUN ACCESS W/ PORT W/O FL MOD SED  08/09/2019  . ROBOTIC ASSISTED SALPINGO OOPHERECTOMY Bilateral 11/05/2019   Procedure: XI ROBOTIC ASSISTED SALPINGO OOPHORECTOMY;  Surgeon: Lafonda Mosses, MD;  [facility];  Service: Gynecology;  Laterality: Bilateral;  Adenoidectomy as a child.   FAMILY HISTORY: Family History  Problem Relation Age of Onset  . Lung cancer Mother 61       smoker  . Ovarian cancer Mother 85  . Colon cancer Maternal Aunt        28s  . Bladder Cancer Paternal Grandfather        death was unrelated  . Cancer Paternal Aunt        through [facility], blood cancer   As of May 2020 patient's father is alive at age 9. Patient's mother is also living at age 4. She has ovarian cancer, diagnosed in 2019, and lung cancer (stage IV), diagnosed in 2016. She tested negative for BRCA.  A maternal aunt had colon cancer in her 42s.  3 other siblings of the patient's mother have not had any cancer.  On the paternal side the paternal grandmother had bladder cancer.  A paternal great aunt died from cancer of the pancreas.     GYNECOLOGIC HISTORY:  Last menstrual period  August 2020  menarche: 44 years old Age at first live birth: 44 years old GX P 3 LMP every month, 5 total days with 2 heavy Contraceptive no, husband with vasectomy. HRT n/a  Hysterectomy? no BSO? 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 Husband [name] is a physical therapist. He is biologically [place] and was adopted by a [place] family.  The patient lives at home with her husband and their 3 children: [name] is 84, [name] is 62, and  [name], 67.  The patient attends a [employer].    ADVANCED DIRECTIVES: Husband [name] is her HCPOA   HEALTH MAINTENANCE: Social History   Tobacco Use  . Smoking status: Never Smoker  . Smokeless tobacco: Never Used  Vaping Use  . Vaping Use: Never used  Substance Use Topics  . Alcohol use: Yes    Comment: rarely  . Drug use: Never     Colonoscopy: never done  PAP: not on file  Bone density: never done   Allergies  Allergen Reactions  . Gluten Meal Hives    Joint pain, upset stomach  . Skin Adhesives [Cyanoacrylate] Hives  . Sulfa Antibiotics     Unknown reaction " I was told I had a reaction when I was a baby "  . Latex Hives  Developed hives from elastic.    Current Outpatient Medications  Medication Sig Dispense Refill  . coconut oil OIL Apply 1 application topically as needed (radiation).    Marland Kitchen levothyroxine (SYNTHROID) 100 MCG tablet Take by mouth.    . tamoxifen (NOLVADEX) 20 MG tablet Take by mouth.     No current facility-administered medications for this visit.    OBJECTIVE: Young white woman in no acute distress Vitals:   04/14/20 1119  BP: 124/85  Pulse: 75  Resp: 20  Temp: (!) 97.2 F (36.2 C)  SpO2: 100%   Wt Readings from Last 3 Encounters:  04/14/20 149 lb 11.2 oz (67.9 kg)  01/16/20 140 lb 14.4 oz (63.9 kg)  11/20/19 141 lb 9.6 oz (64.2 kg)   Body mass index is 25.7 kg/m.    ECOG FS:1 - Symptomatic but completely ambulatory  Sclerae  unicteric, EOMs intact Wearing a mask No cervical or supraclavicular adenopathy Lungs no rales or rhonchi Heart regular rate and rhythm Abd soft, nontender, positive bowel sounds MSK no focal spinal tenderness, no upper extremity lymphedema Neuro: nonfocal, well oriented, appropriate affect Breasts: The right breast is benign.  The left breast is status post mastectomy.  There is no evidence of local recurrence.  Both axillae are benign.   LAB RESULTS:  CMP     Component Value Date/Time   NA 140 04/14/2020 1052   K 4.1 04/14/2020 1052   CL 105 04/14/2020 1052   CO2 30 04/14/2020 1052   GLUCOSE 50 (L) 04/14/2020 1052   BUN 11 04/14/2020 1052   CREATININE 0.72 04/14/2020 1052   CALCIUM 9.3 04/14/2020 1052   PROT 6.7 04/14/2020 1052   ALBUMIN 3.8 04/14/2020 1052   AST 21 04/14/2020 1052   ALT 38 04/14/2020 1052   ALKPHOS 70 04/14/2020 1052   BILITOT 0.5 04/14/2020 1052   GFRNONAA >60 04/14/2020 1052   GFRAA >60 04/14/2020 1052    No results found for: TOTALPROTELP, ALBUMINELP, A1GS, A2GS, BETS, BETA2SER, GAMS, MSPIKE, SPEI  No results found for: KPAFRELGTCHN, LAMBDASER, KAPLAMBRATIO  Lab Results  Component Value Date   WBC 4.7 04/14/2020   NEUTROABS 3.5 04/14/2020   HGB 12.5 04/14/2020   HCT 38.6 04/14/2020   MCV 93.9 04/14/2020   PLT 222 04/14/2020    No results found for: LABCA2  No components found for: ZOXWRU045  No results for input(s): INR in the last 168 hours.  No results found for: LABCA2  No results found for: WUJ811  No results found for: BJY782  No results found for: NFA213  No results found for: CA2729  No components found for: HGQUANT  No results found for: CEA1 / No results found for: CEA1   No results found for: AFPTUMOR  No results found for: CHROMOGRNA   No results found for: HGBA, HGBA2QUANT, HGBFQUANT, HGBSQUAN (Hemoglobinopathy evaluation)   No results found for: LDH  No results found for: IRON, TIBC, IRONPCTSAT (Iron and  TIBC)  No results found for: FERRITIN  Urinalysis    Component Value Date/Time   COLORURINE STRAW (A) 10/29/2019 0932   APPEARANCEUR CLEAR 10/29/2019 0932   LABSPEC 1.003 (L) 10/29/2019 0932   PHURINE 6.0 10/29/2019 0932   GLUCOSEU NEGATIVE 10/29/2019 0932   HGBUR NEGATIVE 10/29/2019 0932   BILIRUBINUR NEGATIVE 10/29/2019 0932   KETONESUR NEGATIVE 10/29/2019 0932   PROTEINUR NEGATIVE 10/29/2019 0932   NITRITE NEGATIVE 10/29/2019 0932   LEUKOCYTESUR NEGATIVE 10/29/2019 0932    STUDIES: No results found.   ELIGIBLE FOR  AVAILABLE RESEARCH PROTOCOL: no  ASSESSMENT: 44 y.o. Tonya Whitehead woman status post left breast biopsy x2 on 12/08/2018, for multicentric invasive lobular breast cancer, grade 2, estrogen and progesterone receptor positive, HER-2 nonamplified, with an MIB-1 of 3%  (a) bilateral breast MRI 12/20/2018 shows multiple masses throughout the left breast, with nipple retraction, but no evidence of chest wall invasion, no abnormal left axillary lymph nodes, and no findings of concern in the right breast  (b) chest CT scan and bone scan 12/29/2018 show only an incidental goiter  (1) status post left skin sparing mastectomy at [facility] 01/16/2019 for an mpT3 pN1, stage IIA invasive lobular carcinoma, grade 2; repeat prognostic panel again estrogen and progesterone receptor strongly positive, HER-2 nonamplified.  (a) a total of 8 lymph nodes removed, one with macrometastasis (8 mm, no ECE)  (2) adjuvant chemotherapy consisting of doxorubicin and cyclophosphamide in dose dense fashion x4 started 03/13/2019, completed 04/24/2019, followed by weekly paclitaxel x12 started 05/09/2019, completed 08/01/2019.  (a) echo 03/09/2019 finds normal left ventricular function at 55-60%  (b) cycle 2 doxorubicin and cyclophosphamide delayed 1 week secondary to nausea problems  (3) adjuvant radiation 08/30/19 - 10/15/19 Site/dose:   The patient initially received a dose of 50.4 Gy in 28 fractions to  the chest wall and supraclavicular region. This was delivered using a 3-D conformal, 4 field technique. The patient then received a boost to the mastectomy scar. This delivered an additional 10 Gy in 5 fractions using an en face electron field. The total dose was 60.4 Gy.  (4) tamoxifen started May 2021  (a) status post hysterectomy and bilateral salpingo-oophorectomy November 04, 2020 with benign pathology  (5) genetics testing 01/09/2019 through the [facility] STAT Breast Cancer Panel + Common Hereditary Cancers Panel found no deleterious mutations in ATM, BRCA1, BRCA2, CDH1, CHEK2, PALB2, PTEN, STK11 and TP53, APC, ATM, AXIN2, BARD1, BMPR1A, BRCA1, BRCA2, BRIP1, CDH1, CDKN2A (p14ARF), CDKN2A (p16INK4a), CKD4, CHEK2, CTNNA1, DICER1, EPCAM (Deletion/duplication testing only), GREM1 (promoter region deletion/duplication testing only), KIT, MEN1, MLH1, MSH2, MSH3, MSH6, MUTYH, NBN, NF1, NHTL1, PALB2, PDGFRA, PMS2, POLD1, POLE, PTEN, RAD50, RAD51C, RAD51D SDHB, SDHC, SDHD, SMAD4, SMARCA4. STK11, TP53, TSC1, TSC2, and VHL.  The following genes were evaluated for sequence changes only: SDHA and HOXB13 c.251G>A variant only.   (6) DIEP reconstruction planned   PLAN: Kelse is now a little over a year out from definitive surgery for her breast cancer with no evidence of disease recurrence.  This is favorable.  She is tolerating tamoxifen well and the plan will be to continue that a minimum of 5 years.  She is tolerating menopause quite well.  She is scheduled for a bone density next year which will be her baseline.  She understands that if she needed vaginal estrogens for menopausal symptoms it is safe to do that while receiving tamoxifen  For some reason her insurance would not pay for bras and prostheses.  I suspect the wrong person may have been contacted and she needs to follow-up on that.  At some point however she is planning to get back with Dr. Hardin Negus for D IEP reconstruction.  We discussed  exercise and she knows the goal is 45 minutes a day 5 days a week.  I am going to see her again in 1 year.  She knows to call for any other issue that may develop before then.  Total encounter time 25 minutes.Sarajane Jews C. Callaway Hardigree, MD 04/14/20 11:35 AM Medical Oncology and Hematology [place]  [place], [place] [address] Tel. [phone]    Fax. [phone]   I, Wilburn Mylar, am acting as scribe for Dr. Virgie Dad. Tonya Whitehead.  I, Lurline Del MD, have reviewed the above documentation for accuracy and completeness, and I agree with the above.    *Total Encounter Time as defined by the Centers for Medicare and Medicaid Services includes, in addition to the face-to-face time of a patient visit (documented in the note above) non-face-to-face time: obtaining and reviewing outside history, ordering and reviewing medications, tests or procedures, care coordination (communications with other health care professionals or caregivers) and documentation in the medical record.

## 2020-04-14 ENCOUNTER — Other Ambulatory Visit: Payer: Self-pay

## 2020-04-14 ENCOUNTER — Inpatient Hospital Stay: Payer: 59 | Attending: Oncology

## 2020-04-14 ENCOUNTER — Inpatient Hospital Stay: Payer: 59 | Admitting: Oncology

## 2020-04-14 VITALS — BP 124/85 | HR 75 | Temp 97.2°F | Resp 20 | Ht 64.0 in | Wt 149.7 lb

## 2020-04-14 DIAGNOSIS — C50812 Malignant neoplasm of overlapping sites of left female breast: Secondary | ICD-10-CM | POA: Diagnosis not present

## 2020-04-14 DIAGNOSIS — Z90710 Acquired absence of both cervix and uterus: Secondary | ICD-10-CM | POA: Diagnosis not present

## 2020-04-14 DIAGNOSIS — Z9221 Personal history of antineoplastic chemotherapy: Secondary | ICD-10-CM | POA: Diagnosis not present

## 2020-04-14 DIAGNOSIS — Z79810 Long term (current) use of selective estrogen receptor modulators (SERMs): Secondary | ICD-10-CM | POA: Insufficient documentation

## 2020-04-14 DIAGNOSIS — Z923 Personal history of irradiation: Secondary | ICD-10-CM | POA: Insufficient documentation

## 2020-04-14 DIAGNOSIS — Z79899 Other long term (current) drug therapy: Secondary | ICD-10-CM | POA: Diagnosis not present

## 2020-04-14 DIAGNOSIS — Z170 Estrogen receptor positive status [ER+]: Secondary | ICD-10-CM | POA: Insufficient documentation

## 2020-04-14 DIAGNOSIS — Z90722 Acquired absence of ovaries, bilateral: Secondary | ICD-10-CM | POA: Diagnosis not present

## 2020-04-14 DIAGNOSIS — C50412 Malignant neoplasm of upper-outer quadrant of left female breast: Secondary | ICD-10-CM

## 2020-04-14 DIAGNOSIS — Z9012 Acquired absence of left breast and nipple: Secondary | ICD-10-CM | POA: Diagnosis not present

## 2020-04-14 LAB — CBC WITH DIFFERENTIAL/PLATELET
Abs Immature Granulocytes: 0.01 10*3/uL (ref 0.00–0.07)
Basophils Absolute: 0 10*3/uL (ref 0.0–0.1)
Basophils Relative: 1 %
Eosinophils Absolute: 0.1 10*3/uL (ref 0.0–0.5)
Eosinophils Relative: 2 %
HCT: 38.6 % (ref 36.0–46.0)
Hemoglobin: 12.5 g/dL (ref 12.0–15.0)
Immature Granulocytes: 0 %
Lymphocytes Relative: 14 %
Lymphs Abs: 0.7 10*3/uL (ref 0.7–4.0)
MCH: 30.4 pg (ref 26.0–34.0)
MCHC: 32.4 g/dL (ref 30.0–36.0)
MCV: 93.9 fL (ref 80.0–100.0)
Monocytes Absolute: 0.4 10*3/uL (ref 0.1–1.0)
Monocytes Relative: 9 %
Neutro Abs: 3.5 10*3/uL (ref 1.7–7.7)
Neutrophils Relative %: 74 %
Platelets: 222 10*3/uL (ref 150–400)
RBC: 4.11 MIL/uL (ref 3.87–5.11)
RDW: 13.2 % (ref 11.5–15.5)
WBC: 4.7 10*3/uL (ref 4.0–10.5)
nRBC: 0 % (ref 0.0–0.2)

## 2020-04-14 LAB — CMP (CANCER CENTER ONLY)
ALT: 38 U/L (ref 0–44)
AST: 21 U/L (ref 15–41)
Albumin: 3.8 g/dL (ref 3.5–5.0)
Alkaline Phosphatase: 70 U/L (ref 38–126)
Anion gap: 5 (ref 5–15)
BUN: 11 mg/dL (ref 6–20)
CO2: 30 mmol/L (ref 22–32)
Calcium: 9.3 mg/dL (ref 8.9–10.3)
Chloride: 105 mmol/L (ref 98–111)
Creatinine: 0.72 mg/dL (ref 0.44–1.00)
GFR, Est AFR Am: >60 mL/min (ref >60)
GFR, Estimated: >60 mL/min (ref >60)
Glucose, Bld: 50 mg/dL — ABNORMAL LOW (ref 70–99)
Potassium: 4.1 mmol/L (ref 3.5–5.1)
Sodium: 140 mmol/L (ref 135–145)
Total Bilirubin: 0.5 mg/dL (ref 0.3–1.2)
Total Protein: 6.7 g/dL (ref 6.5–8.1)

## 2020-04-15 ENCOUNTER — Telehealth: Payer: Self-pay | Admitting: Oncology

## 2020-04-15 NOTE — Telephone Encounter (Signed)
Scheduled appts per 9/20 los. Pt confirmed appt date and time.

## 2020-06-25 DIAGNOSIS — R69 Illness, unspecified: Secondary | ICD-10-CM | POA: Diagnosis not present

## 2020-06-25 DIAGNOSIS — E890 Postprocedural hypothyroidism: Secondary | ICD-10-CM | POA: Diagnosis not present

## 2020-06-25 DIAGNOSIS — C50912 Malignant neoplasm of unspecified site of left female breast: Secondary | ICD-10-CM | POA: Diagnosis not present

## 2020-10-16 ENCOUNTER — Other Ambulatory Visit: Payer: Self-pay | Admitting: Oncology

## 2020-12-03 ENCOUNTER — Other Ambulatory Visit: Payer: Self-pay | Admitting: Oncology

## 2020-12-31 IMAGING — MG DIGITAL DIAGNOSTIC BILATERAL MAMMOGRAM WITH TOMO AND CAD
6 of 10 series · 6 of 30 positions shown · non-contrast
Comparison: None

CLINICAL DATA: Patient presents for palpable abnormality and
indentation within the left breast.

EXAM:
DIGITAL DIAGNOSTIC BILATERAL MAMMOGRAM WITH CAD AND TOMO
ULTRASOUND LEFT BREAST

[R CC synth-2D]
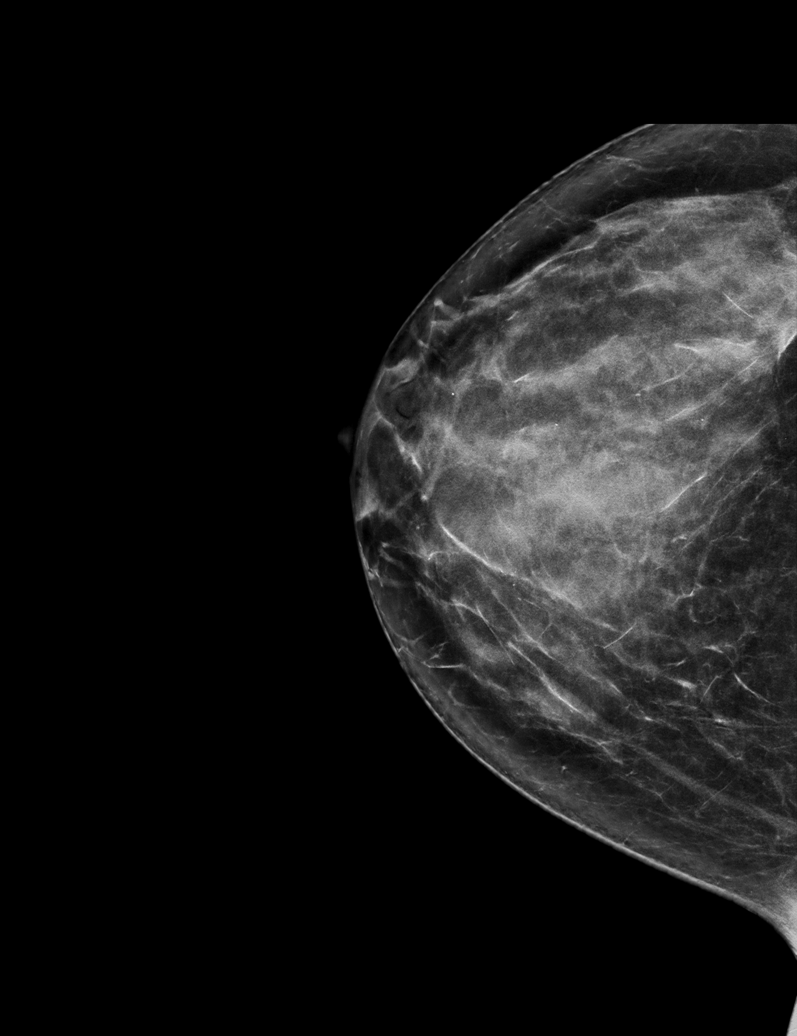

[L MLO synth-2D (1 of 2)]
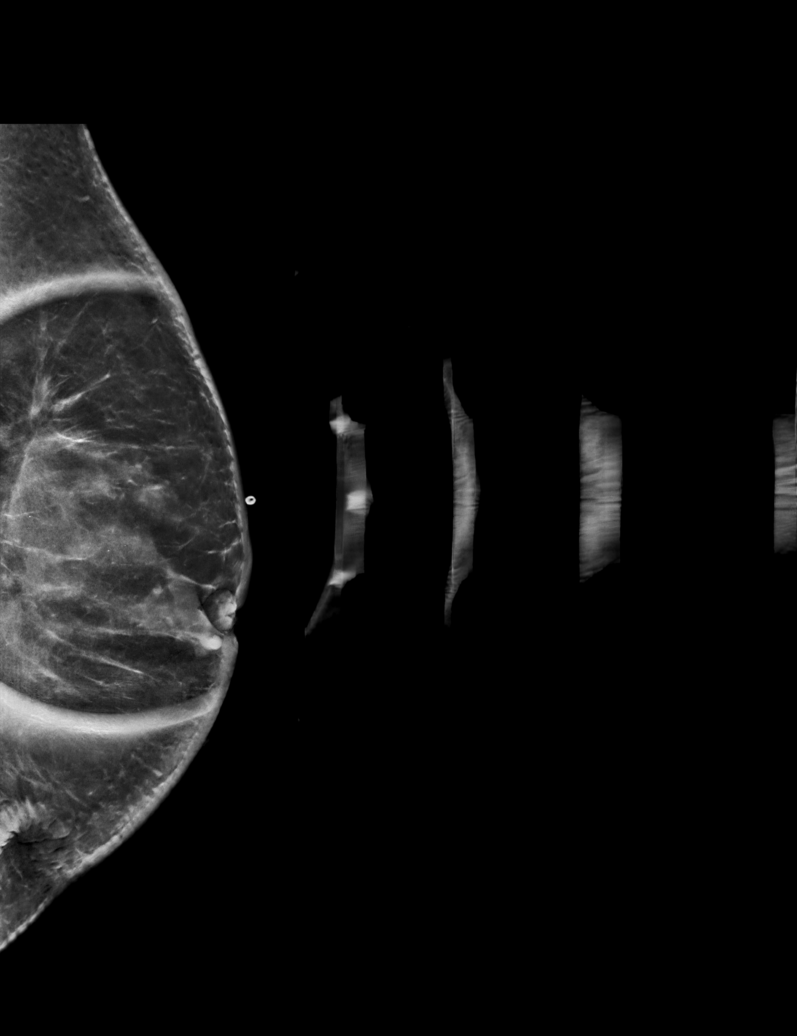

[L CC synth-2D]
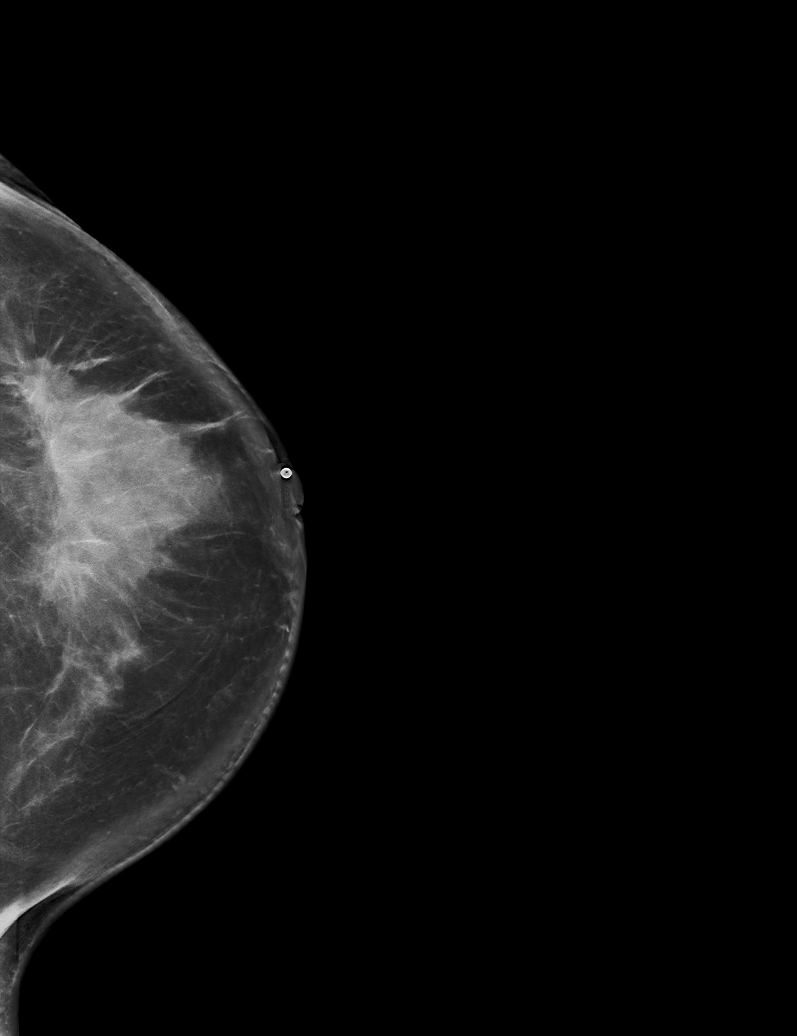

[R MLO synth-2D]
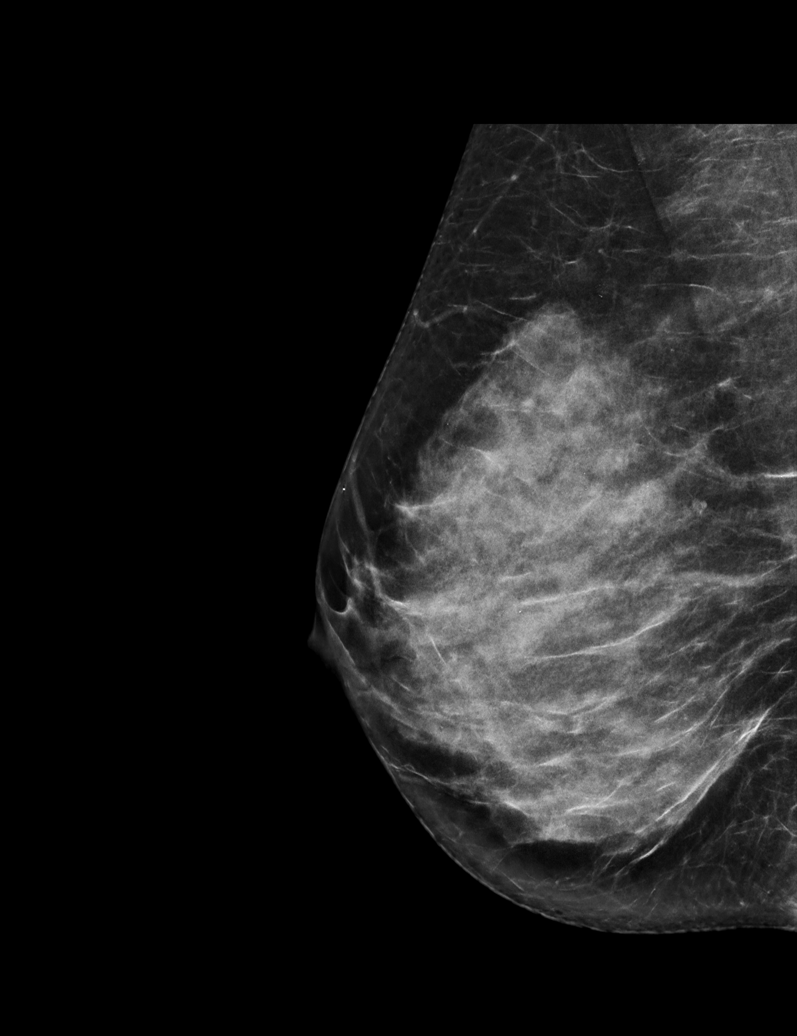

[L MLO synth-2D (2 of 2)]
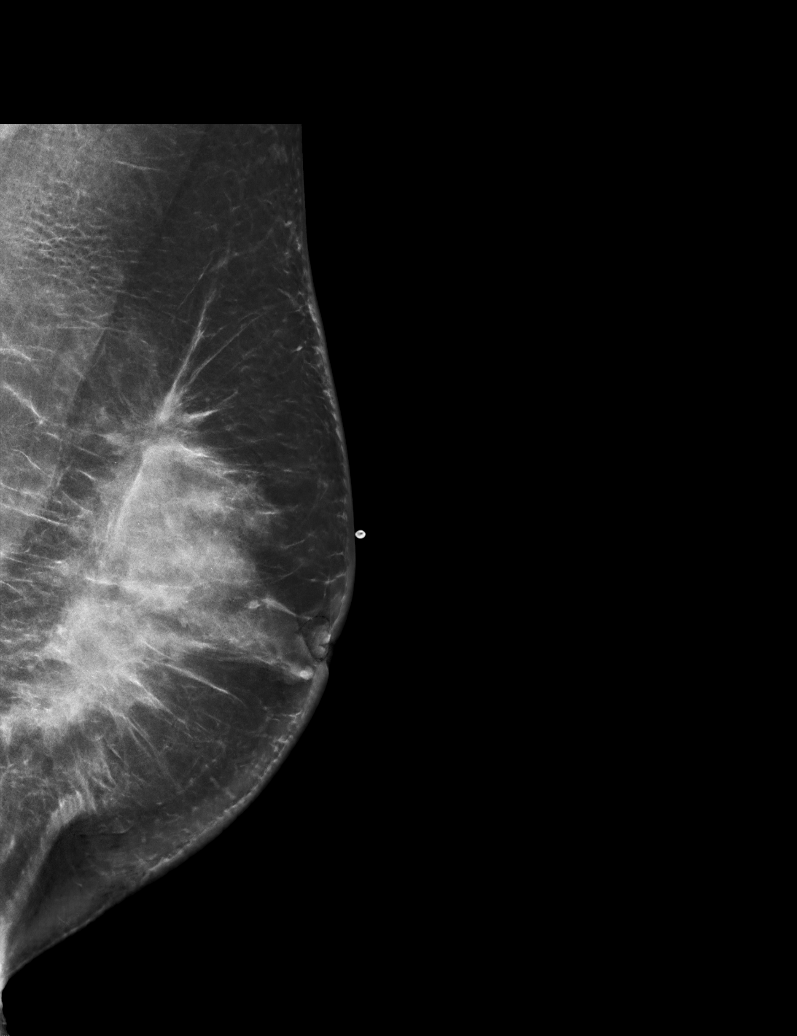

[L MLO tomo · tomo slice 41/82.0]
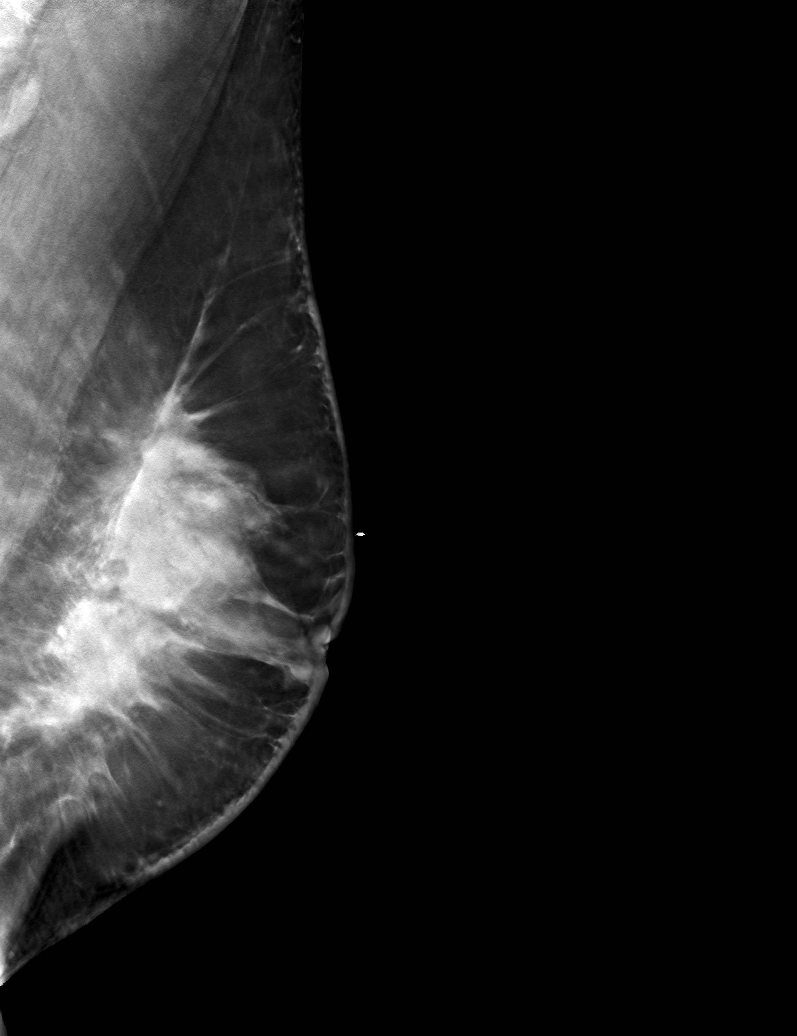

[6 of 30 positions shown; findings below may reference images not displayed]

ACR Breast Density Category c: The breast tissue is heterogeneously
dense, which may obscure small masses.
FINDINGS: The left breast is shrunken in appearance. There is a large area of
distortion with associated mass predominately involving the upper
outer and lower outer left breast. This appears to extend centrally
within the left breast to the level of the nipple. Within the
superior central right breast there is an oval circumscribed mass.

Mammographic images were processed with CAD.

On physical exam, I palpate a firm mass within the upper-outer left
breast. There is left nipple retraction. There is indentation along
the inferior margin of the left breast.

Targeted ultrasound is performed, showing a 1.3 x 0.8 x 1.3 cm cyst
right breast 12 o'clock position 2 cm from the nipple.

Within the left breast 2 o'clock position 1-2 cm from the nipple
there is a 4.6 x 2.9 x 5.6 cm irregular mixed echogenicity mass.
This mass appears to extend to the level of the nipple.

Slightly closer to the nipple, extending and contiguous with the
previously described mass is an additional 4.0 x 3.5 x 4.0 cm
irregular mixed echogenicity mass left breast 2 o'clock position 1
cm from the nipple.

Within the left breast 6 o'clock position 3 cm from nipple there is
a 3.0 x 1.7 x 3.3 cm irregular hypoechoic mass.

Borderline left axillary lymph node measuring 3 mm. This lymph node
is deep within the left axilla and appears adjacent to a left
axillary artery.
IMPRESSION: 1. Findings concerning for extensive malignancy throughout the left
breast predominately involving the upper outer and lower outer
quadrants.
2. Borderline left axillary lymph node measuring 3 mm.

RECOMMENDATION:
1. Ultrasound-guided core needle biopsy large left breast mass 2
o'clock position 1-2 cm from nipple.
2. Ultrasound-guided core needle biopsy left breast mass 6 o'clock
position.
3. The left axillary lymph node is borderline in size and deep
within the axilla, adjacent to an artery. This is likely not
accessible to percutaneous biopsy. Recommend attention to the axilla
on MRI.
4. After ultrasound-guided core needle biopsy, recommend bilateral
breast MRI for further evaluation of extent of disease given dense
breast tissue and shrunken left breast.

I have discussed the findings and recommendations with the patient.
Results were also provided in writing at the conclusion of the
visit. If applicable, a reminder letter will be sent to the patient
regarding the next appointment.

BI-RADS CATEGORY  5: Highly suggestive of malignancy.

## 2021-01-07 IMAGING — MG MM CLIP PLACEMENT
4 series · 4 of 12 positions shown · non-contrast
Comparison: Previous exam(s).

CLINICAL DATA: Patient status post ultrasound-guided core needle
biopsy 2 left breast.

EXAM:
DIAGNOSTIC LEFT MAMMOGRAM POST ULTRASOUND BIOPSY

[L ML synth-2D]
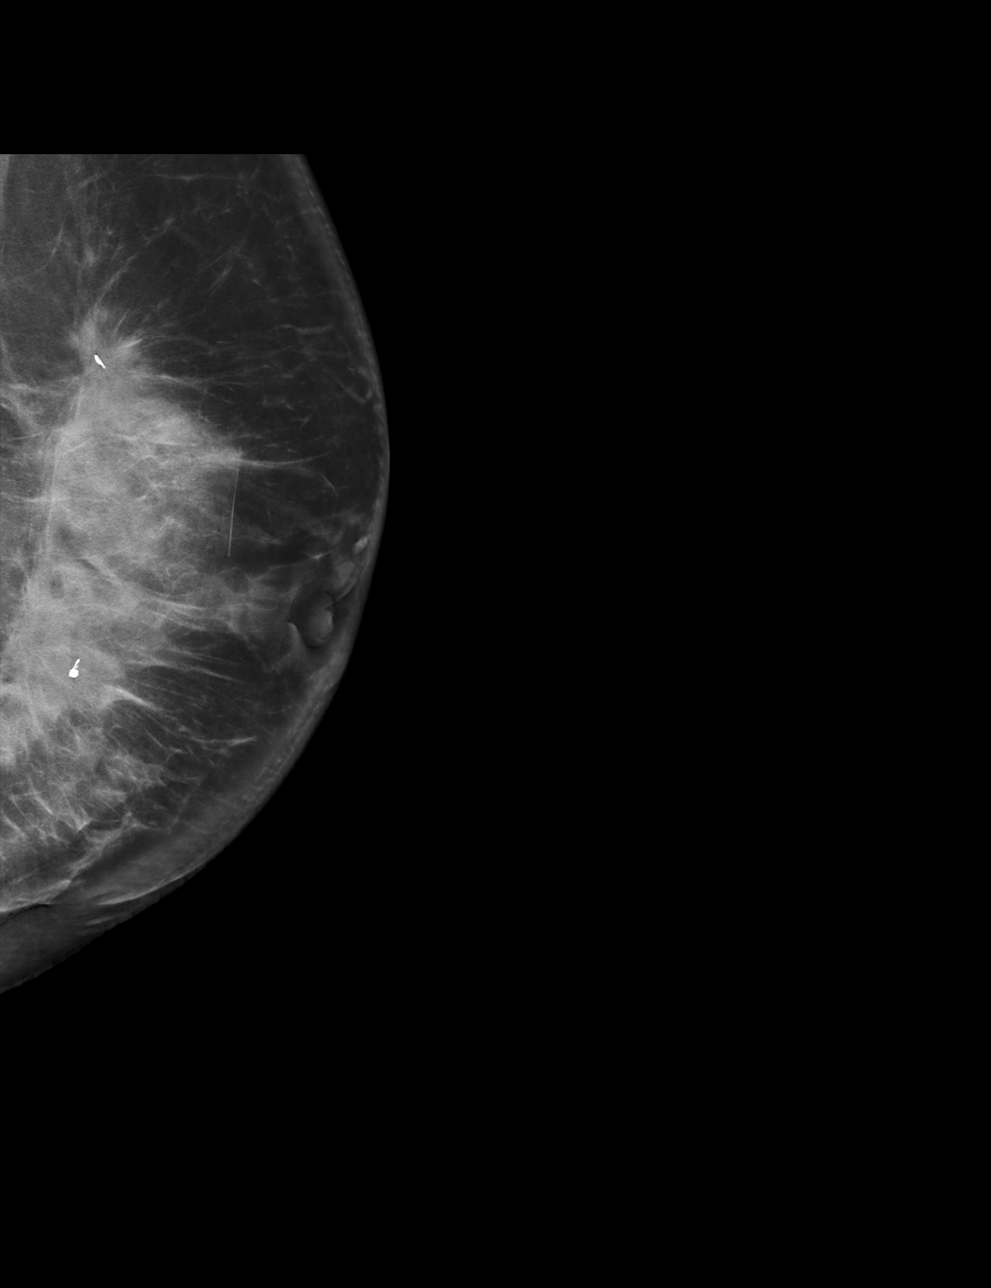

[L CC synth-2D]
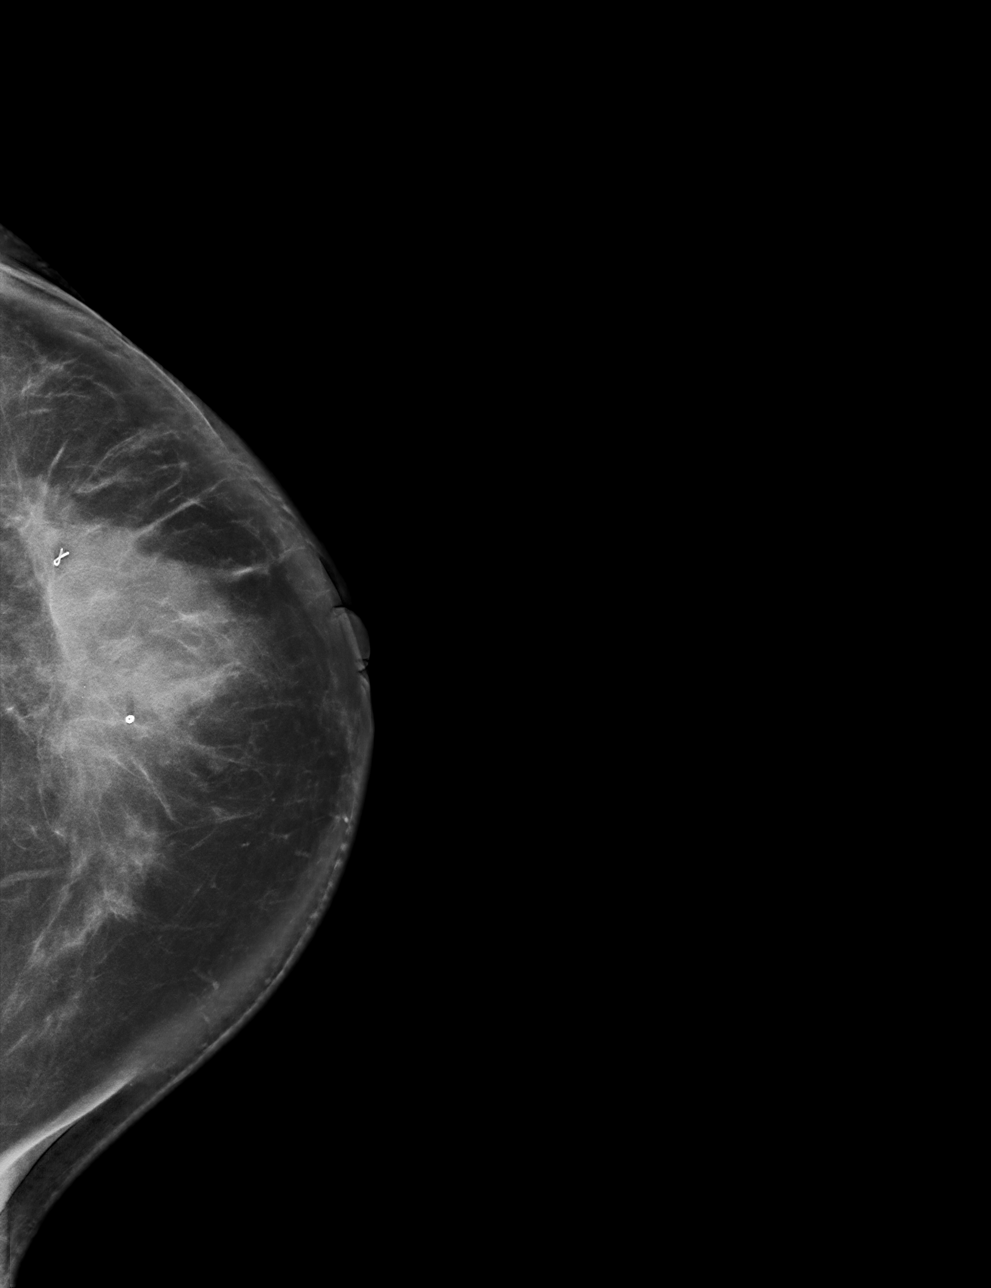

[L ML tomo · tomo slice 47/93.0]
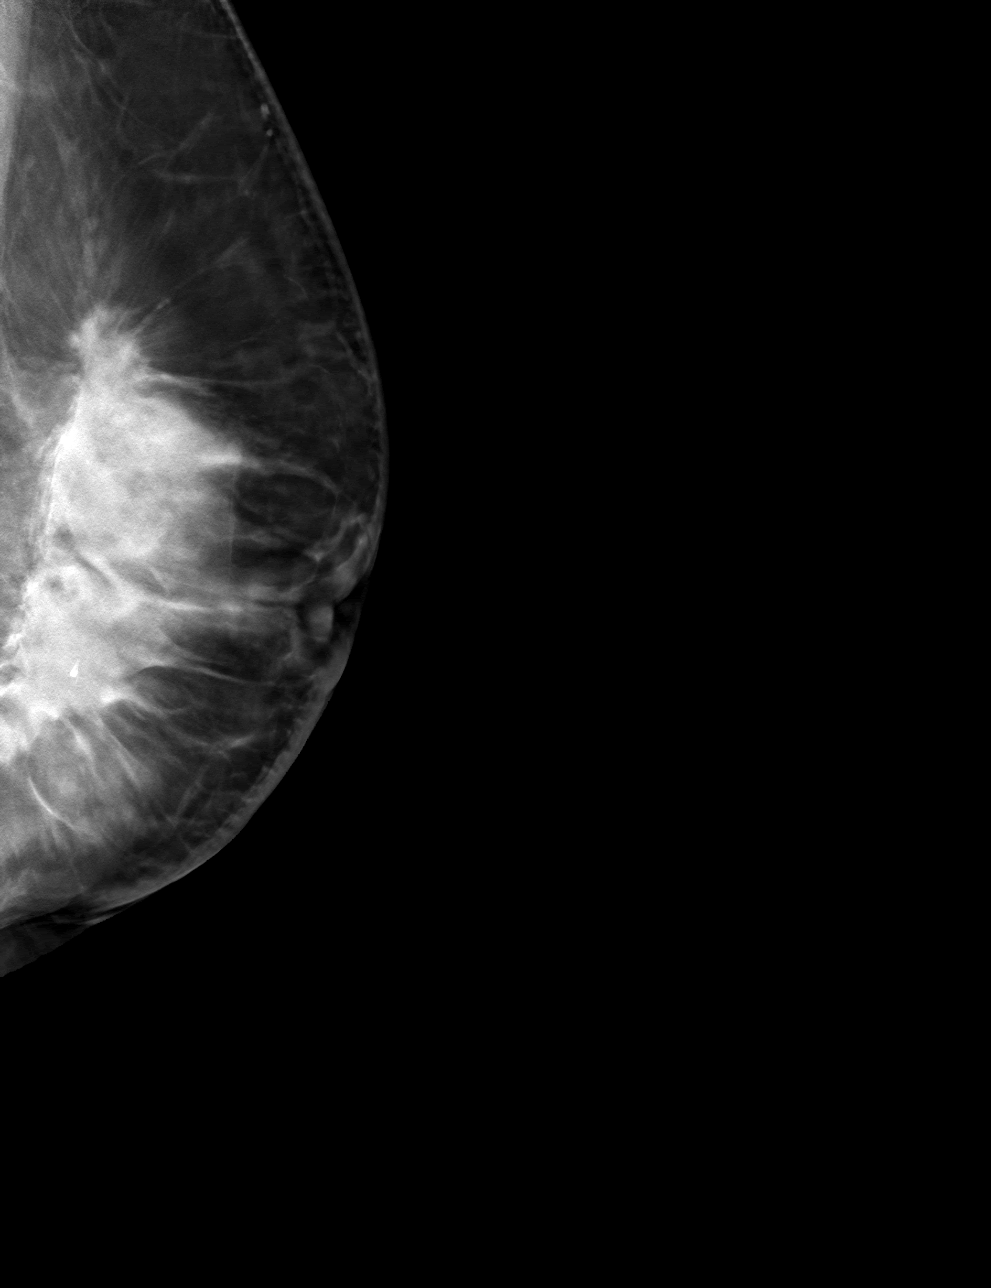

[L CC tomo · tomo slice 51/101.0]
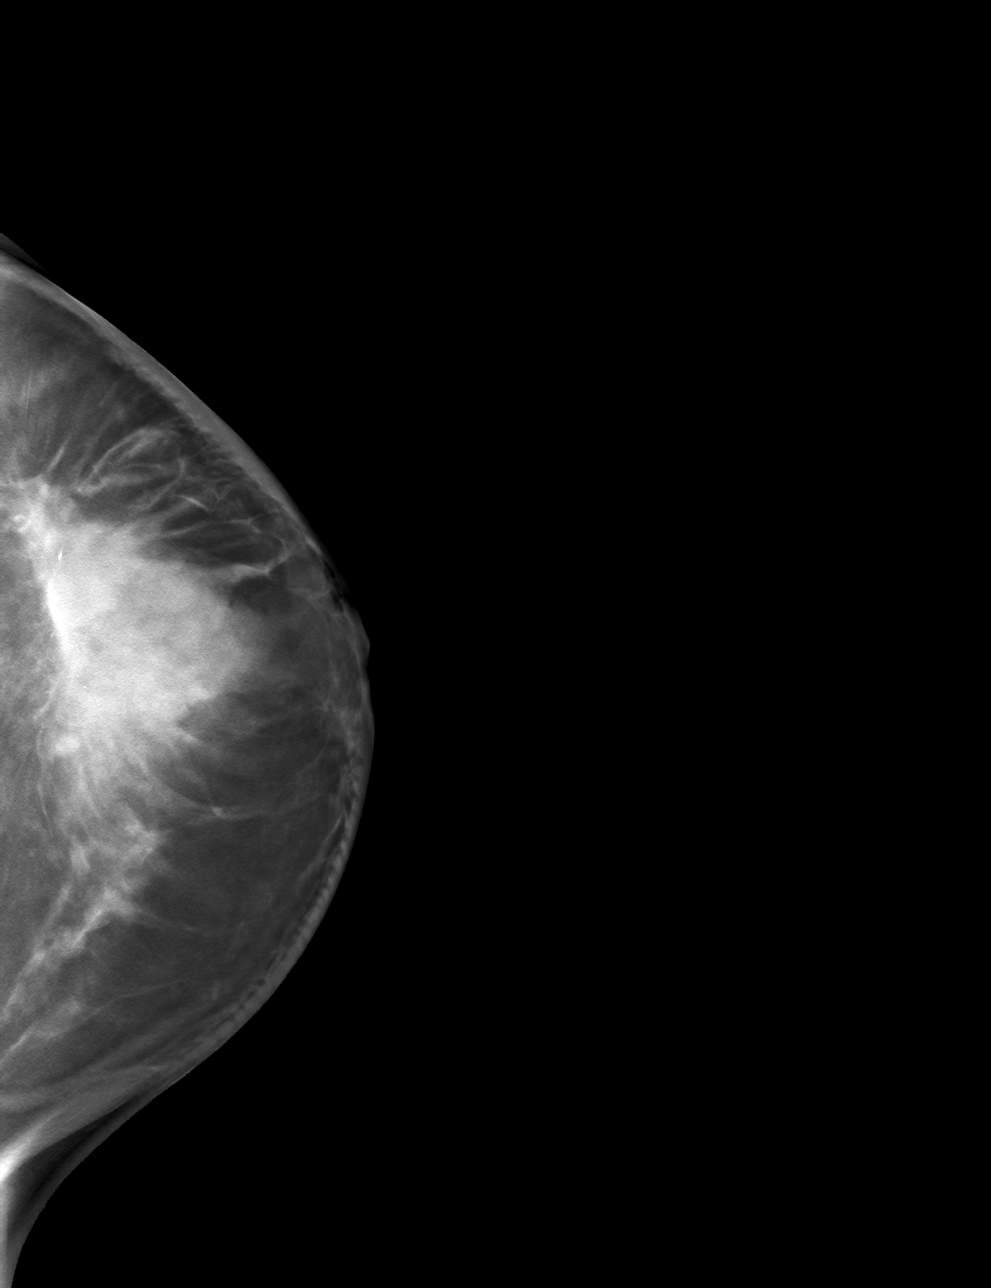

[4 of 12 positions shown; findings below may reference images not displayed]

FINDINGS: Mammographic images were obtained following ultrasound guided biopsy
of left breast masses.

Site 1: Upper-outer left breast: Ribbon shaped marking clip: In
appropriate position.

Site 2: Inferior left breast: Coil shaped clip: In appropriate
position.
IMPRESSION: Appropriate position biopsy marking clip status post
ultrasound-guided biopsy 2 left breast masses.

Final Assessment: Post Procedure Mammograms for Marker Placement

## 2021-01-14 ENCOUNTER — Other Ambulatory Visit: Payer: Self-pay | Admitting: Oncology

## 2021-02-25 DIAGNOSIS — Z1231 Encounter for screening mammogram for malignant neoplasm of breast: Secondary | ICD-10-CM | POA: Diagnosis not present

## 2021-03-03 DIAGNOSIS — E538 Deficiency of other specified B group vitamins: Secondary | ICD-10-CM | POA: Diagnosis not present

## 2021-03-05 ENCOUNTER — Other Ambulatory Visit: Payer: Self-pay | Admitting: Oncology

## 2021-03-05 DIAGNOSIS — R82998 Other abnormal findings in urine: Secondary | ICD-10-CM | POA: Diagnosis not present

## 2021-03-23 DIAGNOSIS — L905 Scar conditions and fibrosis of skin: Secondary | ICD-10-CM | POA: Diagnosis not present

## 2021-03-23 DIAGNOSIS — Z85828 Personal history of other malignant neoplasm of skin: Secondary | ICD-10-CM | POA: Diagnosis not present

## 2021-03-23 DIAGNOSIS — L820 Inflamed seborrheic keratosis: Secondary | ICD-10-CM | POA: Diagnosis not present

## 2021-03-31 DIAGNOSIS — Z170 Estrogen receptor positive status [ER+]: Secondary | ICD-10-CM | POA: Diagnosis not present

## 2021-03-31 DIAGNOSIS — C50812 Malignant neoplasm of overlapping sites of left female breast: Secondary | ICD-10-CM | POA: Diagnosis not present

## 2021-03-31 DIAGNOSIS — C50412 Malignant neoplasm of upper-outer quadrant of left female breast: Secondary | ICD-10-CM | POA: Diagnosis not present

## 2021-03-31 DIAGNOSIS — Z9012 Acquired absence of left breast and nipple: Secondary | ICD-10-CM | POA: Diagnosis not present

## 2021-04-07 IMAGING — US IR LEFT FLUORO GUIDE CV LINE
1 series · 1 of 1 positions shown · non-contrast
Comparison: none

INDICATION: 42-year-old female with left-sided breast cancer. She presents for
port catheter placement.

[Series 1: ir (id) (id)/(id)/(id) ir · 1 of 1 slices shown]
[im 1/1]
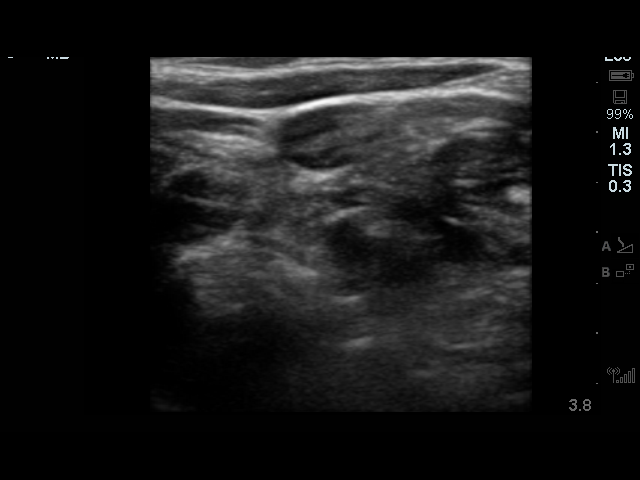

[1 of 1 positions shown; findings below may reference images not displayed]

EXAM:
IMPLANTED PORT A CATH PLACEMENT WITH ULTRASOUND AND FLUOROSCOPIC
GUIDANCE

MEDICATIONS:
2 g Ancef; The antibiotic was administered within an appropriate
time interval prior to skin puncture.

ANESTHESIA/SEDATION:
Versed 4 mg IV; Fentanyl 100 mcg IV;

Moderate Sedation Time:  23 minutes

The patient was continuously monitored during the procedure by the
interventional radiology nurse under my direct supervision.

FLUOROSCOPY TIME:  0 minutes, 18 seconds (2 mGy)

COMPLICATIONS:
None immediate.

PROCEDURE:
The right neck and chest was prepped with chlorhexidine, and draped
in the usual sterile fashion using maximum barrier technique (cap
and mask, sterile gown, sterile gloves, large sterile sheet, hand
hygiene and cutaneous antiseptic). Local anesthesia was attained by
infiltration with 1% lidocaine with epinephrine.

Ultrasound demonstrated patency of the right internal jugular vein,
and this was documented with an image. Under real-time ultrasound
guidance, this vein was accessed with a 21 gauge micropuncture
needle and image documentation was performed. A small dermatotomy
was made at the access site with an 11 scalpel. A 0.018" wire was
advanced into the SVC and the access needle exchanged for a 4F
micropuncture vascular sheath. The 0.018" wire was then removed and
a 0.035" wire advanced into the IVC.



The venous access site was then serially dilated and a peel away
vascular sheath placed over the wire. The wire was removed and the
port catheter advanced into position under fluoroscopic guidance.
The catheter tip is positioned in the superior cavoatrial junction.
This was documented with a spot image. The portacatheter was then
tested and found to flush and aspirate well. The port was flushed
with saline followed by 100 units/mL heparinized saline.

The pocket was then closed in two layers using first subdermal
inverted interrupted absorbable sutures followed by a running
subcuticular suture. The epidermis was then sealed with Dermabond.
The dermatotomy at the venous access site was also closed with
Dermabond.
IMPRESSION: Successful placement of a right IJ approach Power Port with
ultrasound and fluoroscopic guidance. The catheter is ready for use.

## 2021-04-14 ENCOUNTER — Other Ambulatory Visit: Payer: Self-pay | Admitting: Oncology

## 2021-04-15 ENCOUNTER — Other Ambulatory Visit: Payer: Self-pay | Admitting: *Deleted

## 2021-04-15 ENCOUNTER — Other Ambulatory Visit: Payer: Self-pay

## 2021-04-15 DIAGNOSIS — Z170 Estrogen receptor positive status [ER+]: Secondary | ICD-10-CM

## 2021-04-15 DIAGNOSIS — C50412 Malignant neoplasm of upper-outer quadrant of left female breast: Secondary | ICD-10-CM

## 2021-04-15 NOTE — Progress Notes (Signed)
[place]  [phone] Fax:([phone]    ID: Tonya Whitehead DOB: 02-21-1976  MR#: [id number]  TRR#:[id number]  Patient Care Team: Tonya Whitehead., MD as PCP - General (Internal Medicine) Tonya Whitehead, Tonya Dad, MD as Consulting Physician (Oncology) Sheliah Hatch, MD as Consulting Physician (Surgical Oncology) Tonya Ore, MD as Consulting Physician (Plastic Surgery) Tonya Nigh, MD as Consulting Physician (Obstetrics and Gynecology) Tonya Mosses, MD as Consulting Physician (Gynecologic Oncology) Tonya Rudd, MD as Consulting Physician (Radiation Oncology) Tonya Newcomer, MD as Attending Physician (Radiology) Tonya Whitehead, Tonya Dad, MD as Consulting Physician (Oncology) Tonya Cruel, MD OTHER MD:  CHIEF COMPLAINT: 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   She started tamoxifen on 11/25/2019.  She tolerates this well.  The horrible hot flashes she was having originally have pretty much settled down.  She has mild vaginal wetness.  That is not a major concern.  She is scheduled for a CT angio of the abdomen and pelvis 05/01/2021 in preparation for possible D IEP reconstruction under Dr. Hardin Negus at [facility]..  She has been instructed to stop tamoxifen 2 to 3 weeks prior to the surgery.  She was offered participation in the spy and [place] [facility] studies at [facility].  Since her last visit, she underwent right screening mammography with tomography at [facility] on 02/25/2021 showing: breast density category D; no evidence of malignancy.    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  She sees about 5 patients or families a day.  By the end of the day as she is having a little trouble concentrating.  She wonders if this is "mental fog", if it is possibly due to the  tamoxifen.  She does have 3 teenagers she says and that also keeps her pretty busy.  She started doing yoga about 4 months ago and she really is getting a lot out of bed and particularly her back pain is much better.  She also walks her dog 30 to 45 minutes most days.  A detailed review of systems today was otherwise stable.   COVID 19 VACCINATION STATUS: [place] x2 followed by 1 booster     HISTORY OF CURRENT ILLNESS: From the original intake note:  Tonya Whitehead notes a history of reconstructive left breast surgery in 2002 while living in [place], [place]. That was for symmetry. The left breast accordingly had some scar tissue and she thought that was what she was noting until mid March, when she felt the left breast was changing more and the nipple seemed to be sinking subtly.  She brought this to Dr Dahlia Bailiff attention and underwent bilateral diagnostic mammography with tomography and bilateral breast ultrasonography at [facility] on 12/01/2018 showing: breast density category C; findings concerning for extensive malignancy throughout the left breast predominately involving the upper- and lower-outer quadrants.  The mass was palpable within the upper outer left breast and there was left nipple retraction as well as indentation along the inferior margin of the left breast.  Targeted ultrasound showed three masses measuring 5.6 cm (2 o'clock position), 4.0 cm (close to the nipple and contiguous to the prior mass), and 3.3 cm (6 o'clock position.).  Also noted was a borderline left axillary lymph node adjacent to the left axillary artery (and so not easy to biopsy).  In the right breast was noted a  1.3 cm cyst.  On 12/08/2018 Tonya Whitehead proceeded to biopsy of 2 of the left breast areas in question. The pathology from this procedure ([id number]) showed: invasive mammary carcinoma, grade 2, in two cores (at 2 o'clock and 6 o'clock) with identical histomorphology; e-cadherin is negative, consistent with a  lobular phenotype. Prognostic indicators significant for: estrogen receptor, 70% positive and progesterone receptor, 70% positive, both with strong staining intensity. Proliferation marker Ki67 at 3%. HER2 negative by immunohistochemistry (1+).  Note also the patient had a thyroid ultrasound 07/18/2018 showing bilateral solitary nodules, which were biopsied 08/09/2018.  Both showed follicular nodules, Bethesda category 2 (benign).  The patient's subsequent history is as detailed below.   PAST MEDICAL HISTORY: Past Medical History:  Diagnosis Date   Breast cancer ([place]) 12/08/2018   invasive lobular   Family history of bladder cancer    Family history of colon cancer    Family history of lung cancer    Family history of ovarian cancer     PAST SURGICAL HISTORY: Past Surgical History:  Procedure Laterality Date   ADENOIDECTOMY     as a child   BREAST EXCISIONAL BIOPSY Left 2002   reconstructive surgery on nipple to match right breast   IR IMAGING GUIDED PORT INSERTION  03/08/2019   IR REMOVAL TUN ACCESS W/ PORT W/O FL MOD SED  08/09/2019   ROBOTIC ASSISTED SALPINGO OOPHERECTOMY Bilateral 11/05/2019   Procedure: XI ROBOTIC [place];  Surgeon: Tonya Mosses, MD;  Location: [facility];  Service: Gynecology;  Laterality: Bilateral;  Adenoidectomy as a child. She underwent total thyroidectomy at [facility]) 12/05/2019, with benign pathology (SP [phone]).  FAMILY HISTORY: Family History  Problem Relation Age of Onset   Lung cancer Mother 64       smoker   Ovarian cancer Mother 33   Colon cancer Maternal Aunt        87s   Bladder Cancer Paternal Grandfather        death was unrelated   Cancer Paternal Aunt        through [facility], blood cancer  As of May 2020, patient's parents are both living, her father at age 70 and her mother at age 18. Her mother has a history of ovarian cancer, diagnosed in 2019, and lung cancer (stage IV), diagnosed in  2016. She tested negative for BRCA.  A maternal aunt had colon cancer in her 91s.  3 other siblings of the patient's mother have not had any cancer.  On the paternal side the paternal grandmother had bladder cancer.  A paternal great aunt died from cancer of the pancreas.     GYNECOLOGIC HISTORY:  Last menstrual period August 2020  menarche: 45 years old Age at first live birth: 45 years old GX P 3 LMP every month, 5 total days with 2 heavy Contraceptive no, husband with vasectomy. HRT n/a  Hysterectomy? no BSO? 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 Husband [name] is a physical therapist. He is biologically [place] and was adopted by a [place] family.  The patient lives at home with her husband and their 3 children: [name] is 67, [name] is 56, and  [name], 84.  The patient attends a [employer].    ADVANCED DIRECTIVES: Husband [name] is her HCPOA   HEALTH MAINTENANCE: Social History   Tobacco Use   Smoking status: Never   Smokeless tobacco: Never  Vaping Use   Vaping Use:  Never used  Substance Use Topics   Alcohol use: Yes    Comment: rarely   Drug use: Never     Colonoscopy: never done  PAP: not on file  Bone density: never done   Allergies  Allergen Reactions   Gluten Meal Hives    Joint pain, upset stomach   Skin Adhesives [Cyanoacrylate] Hives   Sulfa Antibiotics     Unknown reaction " I was told I had a reaction when I was a baby "   Latex Hives    Developed hives from elastic.    Current Outpatient Medications  Medication Sig Dispense Refill   cholecalciferol (VITAMIN D3) 25 MCG (1000 UNIT) tablet Take 1 tablet (1,000 Units total) by mouth daily.     coconut oil OIL Apply 1 application topically as needed (radiation).     magnesium oxide (MAG-OX) 400 (241.3 Mg) MG tablet Take 1 tablet (400 mg total) by mouth daily.     Omega-3 Fatty Acids (FISH OIL) 500 MG CAPS Take by mouth.  0   tamoxifen  (NOLVADEX) 20 MG tablet TAKE ONE TABLET BY MOUTH DAILY 30 tablet 2   vitamin B-12 (CYANOCOBALAMIN) 500 MCG tablet Take 1 tablet (500 mcg total) by mouth daily.     No current facility-administered medications for this visit.    OBJECTIVE: Young white woman in no acute distress Vitals:   04/16/21 0940  BP: 110/72  Pulse: 72  Resp: 17  Temp: 98.1 F (36.7 C)  SpO2: 100%    Wt Readings from Last 3 Encounters:  04/16/21 153 lb 9.6 oz (69.7 kg)  04/14/20 149 lb 11.2 oz (67.9 kg)  01/16/20 140 lb 14.4 oz (63.9 kg)   Body mass index is 26.37 kg/m.    ECOG FS:1 - Symptomatic but completely ambulatory  Sclerae unicteric, EOMs intact Wearing a mask No cervical or supraclavicular adenopathy Lungs no rales or rhonchi Heart regular rate and rhythm Abd soft, nontender, positive bowel sounds MSK no focal spinal tenderness, no upper extremity lymphedema Neuro: nonfocal, well oriented, appropriate affect Breasts: The right breast is unremarkable.  The left breast is status post mastectomy.  There is no evidence of local recurrence.  Both axillae are benign.   LAB RESULTS:  CMP     Component Value Date/Time   NA 140 04/14/2020 1052   K 4.1 04/14/2020 1052   CL 105 04/14/2020 1052   CO2 30 04/14/2020 1052   GLUCOSE 50 (L) 04/14/2020 1052   BUN 11 04/14/2020 1052   CREATININE 0.72 04/14/2020 1052   CALCIUM 9.3 04/14/2020 1052   PROT 6.7 04/14/2020 1052   ALBUMIN 3.8 04/14/2020 1052   AST 21 04/14/2020 1052   ALT 38 04/14/2020 1052   ALKPHOS 70 04/14/2020 1052   BILITOT 0.5 04/14/2020 1052   GFRNONAA >60 04/14/2020 1052   GFRAA >60 04/14/2020 1052    No results found for: TOTALPROTELP, ALBUMINELP, A1GS, A2GS, BETS, BETA2SER, GAMS, MSPIKE, SPEI  No results found for: KPAFRELGTCHN, LAMBDASER, KAPLAMBRATIO  Lab Results  Component Value Date   WBC 5.1 04/16/2021   NEUTROABS 3.7 04/16/2021   HGB 12.7 04/16/2021   HCT 39.9 04/16/2021   MCV 92.1 04/16/2021   PLT 233  04/16/2021    No results found for: LABCA2  No components found for: [id number]  No results for input(s): INR in the last 168 hours.  No results found for: LABCA2  No results found for: HAL937  No results found for: TKW409  No results found for:  ZOX096  No results found for: CA2729  No components found for: HGQUANT  No results found for: CEA1 / No results found for: CEA1   No results found for: AFPTUMOR  No results found for: CHROMOGRNA   No results found for: HGBA, HGBA2QUANT, HGBFQUANT, HGBSQUAN (Hemoglobinopathy evaluation)   No results found for: LDH  No results found for: IRON, TIBC, IRONPCTSAT (Iron and TIBC)  No results found for: FERRITIN  Urinalysis    Component Value Date/Time   COLORURINE STRAW (A) 10/29/2019 0932   APPEARANCEUR CLEAR 10/29/2019 0932   LABSPEC 1.003 (L) 10/29/2019 0932   PHURINE 6.0 10/29/2019 0932   GLUCOSEU NEGATIVE 10/29/2019 0932   HGBUR NEGATIVE 10/29/2019 0932   BILIRUBINUR NEGATIVE 10/29/2019 0932   KETONESUR NEGATIVE 10/29/2019 0932   PROTEINUR NEGATIVE 10/29/2019 0932   NITRITE NEGATIVE 10/29/2019 0932   LEUKOCYTESUR NEGATIVE 10/29/2019 0932    STUDIES: No results found.   ELIGIBLE FOR AVAILABLE RESEARCH PROTOCOL: no  ASSESSMENT: 45 y.o. [place] woman status post left breast biopsy x2 on 12/08/2018, for multicentric invasive lobular breast cancer, grade 2, estrogen and progesterone receptor positive, HER-2 nonamplified, with an MIB-1 of 3%  (a) bilateral breast MRI 12/20/2018 shows multiple masses throughout the left breast, with nipple retraction, but no evidence of chest wall invasion, no abnormal left axillary lymph nodes, and no findings of concern in the right breast  (b) chest CT scan and bone scan 12/29/2018 show only an incidental goiter  (1) status post left skin sparing mastectomy at [facility] 01/16/2019 for an mpT3 pN1, stage IIA invasive lobular carcinoma, grade 2; repeat prognostic panel again estrogen and  progesterone receptor strongly positive, HER-2 nonamplified.  (a) a total of 8 lymph nodes removed, one with macrometastasis (8 mm, no ECE)  (2) adjuvant chemotherapy consisting of doxorubicin and cyclophosphamide in dose dense fashion x4 started 03/13/2019, completed 04/24/2019, followed by weekly paclitaxel x12 started 05/09/2019, completed 08/01/2019.  (a) echo 03/09/2019 finds normal left ventricular function at 55-60%  (b) cycle 2 doxorubicin and cyclophosphamide delayed 1 week secondary to nausea problems  (3) adjuvant radiation 08/30/19 - 10/15/19  Site/dose:   The patient initially received a dose of 50.4 Gy in 28 fractions to the chest wall and supraclavicular region. This was delivered using a 3-D conformal, 4 field technique. The patient then received a boost to the mastectomy scar. This delivered an additional 10 Gy in 5 fractions using an en face electron field. The total dose was 60.4 Gy.  (4) tamoxifen started May 2021  (a) status post hysterectomy and bilateral salpingo-oophorectomy November 04, 2020 with benign pathology  (5) genetics testing 01/09/2019 through the [facility] STAT Breast Cancer Panel + Common Hereditary Cancers Panel found no deleterious mutations in ATM, BRCA1, BRCA2, CDH1, CHEK2, PALB2, PTEN, STK11 and TP53, APC, ATM, AXIN2, BARD1, BMPR1A, BRCA1, BRCA2, BRIP1, CDH1, CDKN2A (p14ARF), CDKN2A (p16INK4a), CKD4, CHEK2, CTNNA1, DICER1, EPCAM (Deletion/duplication testing only), GREM1 (promoter region deletion/duplication testing only), KIT, MEN1, MLH1, MSH2, MSH3, MSH6, MUTYH, NBN, NF1, NHTL1, PALB2, PDGFRA, PMS2, POLD1, POLE, PTEN, RAD50, RAD51C, RAD51D SDHB, SDHC, SDHD, SMAD4, SMARCA4. STK11, TP53, TSC1, TSC2, and VHL.  The following genes were evaluated for sequence changes only: SDHA and HOXB13 c.251G>A variant only.   (6) DIEP reconstruction planned  (7) consider abemaciclib/Verzenio   PLAN: Tonya Whitehead is now a little over 2 years out from definitive surgery for her breast  cancer with no evidence of disease recurrence.  This is very favorable.  She did have a large tumor with a  positive lymph node and I think considering abemaciclib in her situation makes sense.  We did briefly discussed the possible toxicities side effects and complications and she will return to see our clinical pharmacologist next Tuesday to get a fuller account.  If she decides to proceed, we will start her early October and then follow-up with a virtual visit perhaps 3 weeks after that to make sure she is tolerating it well.  Otherwise she will have repeat mammography in a year and see [place] shortly after that test  She knows to call for any other issue that may develop before then.  Total encounter time 25 minutes.Sarajane Jews C. Tiffani Kadow, MD 04/16/21 9:53 AM Medical Oncology and Hematology [facility] [place], [place] [address] Tel. [phone]    Fax. [phone]   I, Wilburn Mylar, am acting as scribe for Dr. Virgie Whitehead. Maycel Riffe.  I, Lurline Del MD, have reviewed the above documentation for accuracy and completeness, and I agree with the above.   *Total Encounter Time as defined by the Centers for Medicare and Medicaid Services includes, in addition to the face-to-face time of a patient visit (documented in the note above) non-face-to-face time: obtaining and reviewing outside history, ordering and reviewing medications, tests or procedures, care coordination (communications with other health care professionals or caregivers) and documentation in the medical record.

## 2021-04-16 ENCOUNTER — Inpatient Hospital Stay: Payer: 59 | Attending: Oncology | Admitting: Oncology

## 2021-04-16 ENCOUNTER — Other Ambulatory Visit: Payer: Self-pay

## 2021-04-16 ENCOUNTER — Inpatient Hospital Stay: Payer: 59

## 2021-04-16 ENCOUNTER — Other Ambulatory Visit: Payer: Self-pay | Admitting: *Deleted

## 2021-04-16 VITALS — BP 110/72 | HR 72 | Temp 98.1°F | Resp 17 | Wt 153.6 lb

## 2021-04-16 DIAGNOSIS — Z9012 Acquired absence of left breast and nipple: Secondary | ICD-10-CM | POA: Diagnosis not present

## 2021-04-16 DIAGNOSIS — Z90722 Acquired absence of ovaries, bilateral: Secondary | ICD-10-CM | POA: Insufficient documentation

## 2021-04-16 DIAGNOSIS — Z79810 Long term (current) use of selective estrogen receptor modulators (SERMs): Secondary | ICD-10-CM | POA: Diagnosis not present

## 2021-04-16 DIAGNOSIS — Z170 Estrogen receptor positive status [ER+]: Secondary | ICD-10-CM

## 2021-04-16 DIAGNOSIS — C50412 Malignant neoplasm of upper-outer quadrant of left female breast: Secondary | ICD-10-CM

## 2021-04-16 DIAGNOSIS — Z79899 Other long term (current) drug therapy: Secondary | ICD-10-CM | POA: Insufficient documentation

## 2021-04-16 DIAGNOSIS — Z9079 Acquired absence of other genital organ(s): Secondary | ICD-10-CM | POA: Diagnosis not present

## 2021-04-16 DIAGNOSIS — Z9221 Personal history of antineoplastic chemotherapy: Secondary | ICD-10-CM | POA: Insufficient documentation

## 2021-04-16 DIAGNOSIS — Z90710 Acquired absence of both cervix and uterus: Secondary | ICD-10-CM | POA: Insufficient documentation

## 2021-04-16 DIAGNOSIS — Z923 Personal history of irradiation: Secondary | ICD-10-CM | POA: Diagnosis not present

## 2021-04-16 DIAGNOSIS — C50812 Malignant neoplasm of overlapping sites of left female breast: Secondary | ICD-10-CM | POA: Insufficient documentation

## 2021-04-16 LAB — CBC WITH DIFFERENTIAL (CANCER CENTER ONLY)
Abs Immature Granulocytes: 0.01 10*3/uL (ref 0.00–0.07)
Basophils Absolute: 0 10*3/uL (ref 0.0–0.1)
Basophils Relative: 1 %
Eosinophils Absolute: 0.1 10*3/uL (ref 0.0–0.5)
Eosinophils Relative: 2 %
HCT: 39.9 % (ref 36.0–46.0)
Hemoglobin: 12.7 g/dL (ref 12.0–15.0)
Immature Granulocytes: 0 %
Lymphocytes Relative: 15 %
Lymphs Abs: 0.8 10*3/uL (ref 0.7–4.0)
MCH: 29.3 pg (ref 26.0–34.0)
MCHC: 31.8 g/dL (ref 30.0–36.0)
MCV: 92.1 fL (ref 80.0–100.0)
Monocytes Absolute: 0.5 10*3/uL (ref 0.1–1.0)
Monocytes Relative: 10 %
Neutro Abs: 3.7 10*3/uL (ref 1.7–7.7)
Neutrophils Relative %: 72 %
Platelet Count: 233 10*3/uL (ref 150–400)
RBC: 4.33 MIL/uL (ref 3.87–5.11)
RDW: 13.1 % (ref 11.5–15.5)
WBC Count: 5.1 10*3/uL (ref 4.0–10.5)
nRBC: 0 % (ref 0.0–0.2)

## 2021-04-16 LAB — CMP (CANCER CENTER ONLY)
ALT: 51 U/L — ABNORMAL HIGH (ref 0–44)
AST: 24 U/L (ref 15–41)
Albumin: 4 g/dL (ref 3.5–5.0)
Alkaline Phosphatase: 81 U/L (ref 38–126)
Anion gap: 8 (ref 5–15)
BUN: 11 mg/dL (ref 6–20)
CO2: 27 mmol/L (ref 22–32)
Calcium: 9.6 mg/dL (ref 8.9–10.3)
Chloride: 107 mmol/L (ref 98–111)
Creatinine: 0.77 mg/dL (ref 0.44–1.00)
GFR, Estimated: >60 mL/min (ref >60)
Glucose, Bld: 100 mg/dL — ABNORMAL HIGH (ref 70–99)
Potassium: 4.5 mmol/L (ref 3.5–5.1)
Sodium: 142 mmol/L (ref 135–145)
Total Bilirubin: 0.4 mg/dL (ref 0.3–1.2)
Total Protein: 7.1 g/dL (ref 6.5–8.1)

## 2021-04-17 ENCOUNTER — Other Ambulatory Visit: Payer: Self-pay

## 2021-04-17 ENCOUNTER — Encounter: Payer: Self-pay | Admitting: Oncology

## 2021-04-17 DIAGNOSIS — Z170 Estrogen receptor positive status [ER+]: Secondary | ICD-10-CM

## 2021-04-17 DIAGNOSIS — C50412 Malignant neoplasm of upper-outer quadrant of left female breast: Secondary | ICD-10-CM

## 2021-04-18 LAB — THYROID PANEL WITH TSH
Free Thyroxine Index: 3.6 (ref 1.2–4.9)
T3 Uptake Ratio: 27 % (ref 24–39)
T4, Total: 13.5 ug/dL — ABNORMAL HIGH (ref 4.5–12.0)
TSH: 0.363 u[IU]/mL — ABNORMAL LOW (ref 0.450–4.500)

## 2021-04-19 ENCOUNTER — Other Ambulatory Visit: Payer: Self-pay | Admitting: Oncology

## 2021-04-20 ENCOUNTER — Encounter: Payer: Self-pay | Admitting: Oncology

## 2021-04-20 ENCOUNTER — Other Ambulatory Visit: Payer: Self-pay | Admitting: Oncology

## 2021-04-20 ENCOUNTER — Other Ambulatory Visit (HOSPITAL_COMMUNITY): Payer: Self-pay

## 2021-04-20 MED ORDER — ABEMACICLIB 150 MG PO TABS
150.0000 mg | ORAL_TABLET | Freq: Two times a day (BID) | ORAL | 6 refills | Status: DC
  Filled 2021-04-20: qty 70, 35d supply, fill #0

## 2021-04-20 MED ORDER — LOPERAMIDE HCL 2 MG PO CAPS
2.0000 mg | ORAL_CAPSULE | ORAL | 4 refills | Status: DC | PRN
  Filled 2021-04-20: qty 60, 30d supply, fill #0

## 2021-04-20 NOTE — Progress Notes (Signed)
Tonya Whitehead decided not to meet with our clincial pharmacologist. Of course she will be counseleed by our oral chemotherapy pharmacy speciaists. I have eadded a script for imodium and requested a virtual visit on OCT 18 to assess tolerance

## 2021-04-21 ENCOUNTER — Other Ambulatory Visit (HOSPITAL_COMMUNITY): Payer: Self-pay

## 2021-04-21 ENCOUNTER — Inpatient Hospital Stay: Payer: 59 | Admitting: Pharmacist

## 2021-04-21 ENCOUNTER — Other Ambulatory Visit: Payer: Self-pay

## 2021-04-21 ENCOUNTER — Telehealth: Payer: Self-pay

## 2021-04-21 ENCOUNTER — Other Ambulatory Visit: Payer: 59

## 2021-04-21 VITALS — BP 102/66 | HR 70 | Temp 98.1°F | Resp 18 | Ht 64.0 in | Wt 155.9 lb

## 2021-04-21 DIAGNOSIS — C50412 Malignant neoplasm of upper-outer quadrant of left female breast: Secondary | ICD-10-CM

## 2021-04-21 DIAGNOSIS — Z170 Estrogen receptor positive status [ER+]: Secondary | ICD-10-CM

## 2021-04-21 MED ORDER — ABEMACICLIB 150 MG PO TABS: 150.0000 mg | ORAL_TABLET | Freq: Two times a day (BID) | ORAL | 6 refills | Status: DC

## 2021-04-21 NOTE — Telephone Encounter (Signed)
Oral Oncology Patient Advocate Encounter  Prior Authorization for Tonya Whitehead has been approved.    PA# [id number] Effective dates: 04/21/21 through 04/21/22  Patient must use [facility]  Oral Oncology Clinic will continue to follow.   [place] [facility] [place] Phone [phone] Fax [phone] 04/21/2021 12:49 PM

## 2021-04-21 NOTE — Telephone Encounter (Signed)
Oral Oncology Patient Advocate Encounter   Received notification from [place] that prior authorization for Verzenio is required.   PA submitted on CoverMyMeds Key [id number] Status is pending   Oral Oncology Clinic will continue to follow.  [place] [facility] [place] Phone [phone] Fax [phone] 04/21/2021 8:53 AM

## 2021-04-21 NOTE — Telephone Encounter (Signed)
Oral Oncology Pharmacist Encounter  Received new prescription for abemaciclib (Verzenio) for the treatment of grade 2, hormone receptor positive, HER2 negative, node positive breast cancer in conjunction with tamoxifen, planned duration until 2 years of therapy or until unacceptable toxicity or disease progression.  Medication dose and frequency assessed, no interventions needed.   Labs from 04/16/21 (CBC and CMP) assessed, no interventions needed.  Current medication list in Epic reviewed, no DDIs with verzenio identified.  Evaluated chart and no patient barriers to medication adherence noted.   Patient agreement for treatment documented in MD note on 04/16/2021.  Prescription has been e-scribed to the [facility] for benefits analysis and approval.  Oral Oncology Clinic will continue to follow for insurance authorization, copayment issues, initial counseling and start date.  Drema Halon, PharmD Hematology/Oncology Clinical Pharmacist [facility] [phone]

## 2021-04-21 NOTE — Progress Notes (Signed)
[facility] [phone]    Fax:([phone]   Oncology Clinical Pharmacist Practitioner Initial Assessment  Tonya Whitehead is a 45 y.o. female with a diagnosis of hormone receptor positive, her-2 negative breast cancer. Dr. Jana Hakim and Ms. Coldwell are interested in starting abemaciclib in the adjuvant setting.  Indication/Regimen Abemaciclib (Verzenio) is being used appropriately for treatment of breast cancer in the adjuvant setting by Dr. Lurline Del.    Wt Readings from Last 1 Encounters:  04/21/21 155 lb 14.4 oz (70.7 kg)    Estimated body surface area is 1.79 meters squared as calculated from the following:   Height as of this encounter: 5' 4" (1.626 m).   Weight as of this encounter: 155 lb 14.4 oz (70.7 kg).  The dosing regimen is 150 mg by mouth every 12 hours on days 1 to 28 of a 28-day cycle. It is planned to continue for 2 years per the monarchE trial data. Dr. Jana Hakim sent in a prescription for abemaciclib yesterday and clinical pharmacy was asked to review the medication and trial data with [name] today. Adverse effects of abemaciclib include but are not limited to: diarrhea, fatigue, nausea, abdominal pain, hair loss, decreased blood counts, and increased liver function tests, and joint pains. Severe, life-threatening, and/or fatal interstitial lung disease (ILD) and/or pneumonitis may occur with CDK 4/6 inhibitors. Discussed that oral chemotherapy pharmacist team will process the claim and reach out to her with copay information and likely review the side effect profile again. Confirmed with patient that she does not have her ovaries.  Dose Modifications None   Allergies Allergies  Allergen Reactions   Gluten Meal Hives    Joint pain, upset stomach   Skin Adhesives [Cyanoacrylate] Hives   Sulfa Antibiotics     Unknown reaction " I was told I had a reaction when I was a baby "   Latex Hives    Developed hives  from elastic.    Laboratory Data CBC EXTENDED Latest Ref Rng & Units 04/16/2021 04/14/2020 10/29/2019  WBC 4.0 - 10.5 K/uL 5.1 4.7 5.4  RBC 3.87 - 5.11 MIL/uL 4.33 4.11 4.23  HGB 12.0 - 15.0 g/dL 12.7 12.5 12.7  HCT 36.0 - 46.0 % 39.9 38.6 39.6  PLT 150 - 400 K/uL 233 222 429(H)  NEUTROABS 1.7 - 7.7 K/uL 3.7 3.5 -  LYMPHSABS 0.7 - 4.0 K/uL 0.8 0.7 -    CMP Latest Ref Rng & Units 04/16/2021 04/14/2020 10/29/2019  Glucose 70 - 99 mg/dL 100(H) 50(L) 79  BUN 6 - 20 mg/dL _0 Creatinine 0.44 - 1.00 mg/dL 0.77 0.72 0.61  Sodium 135 - 145 mmol/L 142 140 141  Potassium 3.5 - 5.1 mmol/L 4.5 4.1 4.1  Chloride 98 - 111 mmol/L 107 105 105  CO2 22 - 32 mmol/L _1 Calcium 8.9 - 10.3 mg/dL 9.6 9.3 9.3  Total Protein 6.5 - 8.1 g/dL 7.1 6.7 -  Total Bilirubin 0.3 - 1.2 mg/dL 0.4 0.5 -  Alkaline Phos 38 - 126 U/L 81 70 -  AST 15 - 41 U/L 24 21 -  ALT 0 - 44 U/L 51(H) 38 -    Contraindications Contraindications were reviewed? Yes Contraindications to therapy were identified? No   Safety Precautions The following safety precautions for the use of abemaciclib were reviewed:  Diarrhea: we reviewed that diarrhea is common with abemaciclib and confirmed that she does have loperamide (Immodium) at home.  We reviewed how  to take this medication PRN and gave her information on abemaciclib Neutropenia: we discussed the importance of having a thermometer and what CDC considers a fever which is 100.4 F or higher.  Gave patient 24/7 triage line to call if any fevers or symptoms ILD/Pneumonitis: we reviewed potential symptoms including cough, shortness, and fatigue. Hepatotoxicity: reviewed to contact clinic for RUQ pain that will not subside, yellowing of eyes/skin VTE: reviewed signs of DVT such as leg swelling, redness, pain, or tenderness and signs of PE such as shortness of breath, rapid or irregular heartbeat, cough, chest pain, or lightheadedness Reviewed to take the medication every 12 hours  (with food sometimes can be easier on the stomach) and to take it at the same time every day. Discussed proper storage and handling of abemaciclib  Medication Reconciliation Current Outpatient Medications  Medication Sig Dispense Refill   cholecalciferol (VITAMIN D3) 25 MCG (1000 UNIT) tablet Take 1 tablet (1,000 Units total) by mouth daily.     Omega-3 Fatty Acids (FISH OIL) 500 MG CAPS Take by mouth daily.  0   tamoxifen (NOLVADEX) 20 MG tablet TAKE ONE TABLET BY MOUTH DAILY 30 tablet 2   vitamin B-12 (CYANOCOBALAMIN) 500 MCG tablet Take 1 tablet (500 mcg total) by mouth daily.     abemaciclib (VERZENIO) 150 MG tablet Take 1 tablet (150 mg total) by mouth 2 (two) times daily. Swallow tablets whole. Do not chew, crush, or split tablets before swallowing. Start 04/27/2021 (Patient not taking: Reported on 04/21/2021) 60 tablet 6   loperamide (IMODIUM) 2 MG capsule Take 1-2 capsules (2-4 mg total) by mouth as needed for diarrhea or loose stools. (Patient not taking: Reported on 04/21/2021) 60 capsule 4   No current facility-administered medications for this visit.   Medication reconciliation is based on the patient's most recent medication list in the electronic medical record (EMR) including herbal products and OTC medications.   The patient's medication list was reviewed today with the patient? Yes   Drug-drug interactions (DDIs) DDIs were evaluated? Yes DDIs identified? No   Drug-Food Interactions Drug-food interactions were evaluated? Yes Drug-food interactions identified?  Avoid grapefruit juice while on abemaciclib  Follow-up Plan  Prescription has been sent for abemaciclib Labs, visit with me in pharmacy clinic in 4 weeks which should allow for her to obtain the medication and start Abemaciclib PI states to have labs every 2 weeks for first 2 months, then monthly for 2 months, then as clinically indicated Will schedule these labs and follow up with Dr. Magrinat in 2 months  Tonya  Whitehead participated in the discussion, expressed understanding, and voiced agreement with the above plan. All questions were answered to her satisfaction. The patient was advised to contact the clinic at [phone] with any questions or concerns prior to her return visit.   I spent 30 minutes assessing the patient.  John A Ingoglia, RPH-CPP, 04/21/2021 10:57 AM  

## 2021-04-22 NOTE — Telephone Encounter (Signed)
Oral Chemotherapy Pharmacist Encounter  I spoke with patient for overview of: Verzenio for the treatment of stage IIA, hormone-receptor positive breast cancer, in combination with tamoxifen, planned duration 2 years.   Counseled patient on administration, dosing, side effects, monitoring, drug-food interactions, safe handling, storage, and disposal.  Patient will take Verzenio 150 mg tablets, 1 tablet by mouth twice daily without regard to food.  Patient knows to avoid grapefruit and grapefruit juice.  Verzenio start date: As soon as pt receives medication from [facility]. Prescription sent 9/27  Adverse effects include but are not limited to: diarrhea, fatigue, nausea, abdominal pain, hair loss, decreased blood counts, and increased liver function tests, and joint pains. Severe, life-threatening, and/or fatal interstitial lung disease (ILD) and/or pneumonitis may occur with CDK 4/6 inhibitors.  Patient requests anti-emetic to have on hand and knows to take it if nausea develops. Messaged MD to send in prescription for anti-emetic. Pt used promethazine 25 mg q6h prn in the past with chemotherapy.  Patient will obtain anti diarrheal and alert the office of 4 or more loose stools above baseline.  Reviewed with patient importance of keeping a medication schedule and plan for any missed doses. No barriers to medication adherence identified.  Medication reconciliation performed and medication/allergy list updated.  Insurance authorization for [employer] has been obtained. This will ship from [place] to deliver to patient's home.  Patient informed the pharmacy will reach out 5-7 days prior to needing next fill of Verzenio to coordinate continued medication acquisition to prevent break in therapy.  All questions answered.  Mrs. Baldassari voiced understanding and appreciation.   Medication education handout placed in mail for patient. Patient knows to call the office with  questions or concerns. Oral Chemotherapy Clinic phone number provided to patient.   Benn Moulder, PharmD Pharmacy Resident  04/22/2021 4:29 PM

## 2021-04-23 ENCOUNTER — Other Ambulatory Visit: Payer: Self-pay | Admitting: Oncology

## 2021-04-23 MED ORDER — PROMETHAZINE HCL 25 MG PO TABS: 12.5000 mg | ORAL_TABLET | Freq: Four times a day (QID) | ORAL | 0 refills | Status: DC | PRN

## 2021-04-27 ENCOUNTER — Telehealth: Payer: Self-pay

## 2021-04-27 DIAGNOSIS — C50412 Malignant neoplasm of upper-outer quadrant of left female breast: Secondary | ICD-10-CM

## 2021-04-27 DIAGNOSIS — Z170 Estrogen receptor positive status [ER+]: Secondary | ICD-10-CM

## 2021-04-27 MED ORDER — LOPERAMIDE HCL 2 MG PO CAPS: 2.0000 mg | ORAL_CAPSULE | ORAL | 4 refills | Status: DC | PRN

## 2021-04-27 NOTE — Telephone Encounter (Signed)
Oral Oncology Pharmacist Encounter  Called patient to verify she has received the Verzenio. [facility] and that it will arrive tomorrow, 04/28/21. I instructed patient to start the medication once she receives it but if it arrives in the afternoon that she should start the medication with the night dose.   Prescription for Imodium was sent to [facility] although patient requested this medication be sent to the same pharmacy she uses for her other medications at [facility]. Medication was sent over to [facility].   Drema Halon, PharmD Hematology/Oncology Clinical Pharmacist [facility] [phone] 04/27/2021 4:12 PM

## 2021-04-29 ENCOUNTER — Other Ambulatory Visit (HOSPITAL_COMMUNITY): Payer: Self-pay

## 2021-05-04 ENCOUNTER — Other Ambulatory Visit: Payer: Self-pay | Admitting: *Deleted

## 2021-05-05 ENCOUNTER — Other Ambulatory Visit: Payer: Self-pay | Admitting: *Deleted

## 2021-05-05 MED ORDER — ONDANSETRON HCL 8 MG PO TABS: 8.0000 mg | ORAL_TABLET | Freq: Three times a day (TID) | ORAL | 3 refills | Status: DC | PRN

## 2021-05-19 ENCOUNTER — Telehealth: Payer: Self-pay | Admitting: *Deleted

## 2021-05-19 ENCOUNTER — Inpatient Hospital Stay: Payer: 59 | Admitting: Pharmacist

## 2021-05-19 ENCOUNTER — Encounter: Payer: Self-pay | Admitting: Oncology

## 2021-05-19 ENCOUNTER — Inpatient Hospital Stay: Payer: 59

## 2021-05-19 NOTE — Telephone Encounter (Signed)
This RN sent My Chart message asking for appt scheduled for today to be cancelled- with Gilford Rile.  She states she is having no issues since starting the Verzenio with start date per inquiry as 05/05/2021.  Pt is scheduled for follow up per video visit on 05/28/2021 with MD.

## 2021-05-28 ENCOUNTER — Inpatient Hospital Stay: Payer: 59 | Admitting: Oncology

## 2021-06-01 NOTE — Progress Notes (Signed)
Tonya Whitehead  Telephone:([phone] Fax:([phone]) [phone]    ID: Tonya Whitehead DOB: 10-05-1975  MR#: [id number]  ZMO#:[id number]  Patient Care Team: Ginger Organ., MD as PCP - General (Internal Medicine) Ludivina Guymon, Virgie Dad, MD as Consulting Physician (Oncology) Sheliah Hatch, MD as Consulting Physician (Surgical Oncology) Stann Ore, MD as Consulting Physician (Plastic Surgery) Arvella Nigh, MD as Consulting Physician (Obstetrics and Gynecology) Lafonda Mosses, MD as Consulting Physician (Gynecologic Oncology) Kyung Rudd, MD as Consulting Physician (Radiation Oncology) Lovey Newcomer, MD as Attending Physician (Radiology) Jonthan Leite, Virgie Dad, MD as Consulting Physician (Oncology) Stann Ore, MD as Referring Physician (Plastic Surgery) Chauncey Cruel, MD OTHER MD:  I connected with Tonya Whitehead on 06/02/21 at 10:30 AM EST by video enabled telemedicine visit and verified that I am speaking with the correct person using two identifiers.   I discussed the limitations, risks, security and privacy concerns of performing an evaluation and management service by telemedicine and the availability of in-person appointments. I also discussed with the patient that there may be a patient responsible charge related to this service. The patient expressed understanding and agreed to proceed.   Other persons participating in the visit and their role in the encounter: None  Patient's location: Home Provider's location: [place]   I provided 15 minutes of face-to-face video visit time during this encounter, and > 50% was spent counseling as documented under my assessment & plan.   CHIEF COMPLAINT: estrogen receptor positive breast cancer (s/p left mastectomy)  CURRENT TREATMENT: tamoxifen; Verzenio   INTERVAL HISTORY: Tonya Whitehead was contacted today for follow-up of her estrogen receptor positive breast cancer.   She  started tamoxifen on 11/25/2019.  She t continues to tolerate this with no significant side effects.  She started Verzenio about 2 weeks ago.  She is having unpredictable diarrhea.  Some days she has no bowel movements.  Yesterday she had 1 diarrheal bowel movement in the morning and then between 11 PM and 2 AM she had 3 diarrheal bowel movements.  She took Imodium just once after the 11 PM bowel movement and then today has had no bowel movements.  She is scheduled for DIEP reconstruction under Dr. Hardin Negus at [facility] on 08/28/2021.  She has been instructed to stop tamoxifen 2 to 3 weeks prior to the surgery.   REVIEW OF SYSTEMS: Tonya Whitehead aside from the diarrhea has had some fatigue and some nausea but these are minor.  A detailed review of systems today was otherwise noncontributory   COVID 19 VACCINATION STATUS: [place] x2 followed by 1 booster     HISTORY OF CURRENT ILLNESS: From the original intake note:  Tonya Whitehead notes a history of reconstructive left breast surgery in 2002 while living in [place], [place]. That was for symmetry. The left breast accordingly had some scar tissue and she thought that was what she was noting until mid March, when she felt the left breast was changing more and the nipple seemed to be sinking subtly.  She brought this to Dr Dahlia Bailiff attention and underwent bilateral diagnostic mammography with tomography and bilateral breast ultrasonography at [facility] on 12/01/2018 showing: breast density category C; findings concerning for extensive malignancy throughout the left breast predominately involving the upper- and lower-outer quadrants.  The mass was palpable within the upper outer left breast and there was left nipple retraction as well as indentation along the inferior margin of the left breast.  Targeted ultrasound showed three masses  measuring 5.6 cm (2 o'clock position), 4.0 cm (close to the nipple and contiguous to the prior mass), and 3.3 cm (6 o'clock  position.).  Also noted was a borderline left axillary lymph node adjacent to the left axillary artery (and so not easy to biopsy).  In the right breast was noted a 1.3 cm cyst.  On 12/08/2018 Tonya Whitehead proceeded to biopsy of 2 of the left breast areas in question. The pathology from this procedure ([id number]) showed: invasive mammary carcinoma, grade 2, in two cores (at 2 o'clock and 6 o'clock) with identical histomorphology; e-cadherin is negative, consistent with a lobular phenotype. Prognostic indicators significant for: estrogen receptor, 70% positive and progesterone receptor, 70% positive, both with strong staining intensity. Proliferation marker Ki67 at 3%. HER2 negative by immunohistochemistry (1+).  Note also the patient had a thyroid ultrasound 07/18/2018 showing bilateral solitary nodules, which were biopsied 08/09/2018.  Both showed follicular nodules, [place] category 2 (benign).  The patient's subsequent history is as detailed below.   PAST MEDICAL HISTORY: Past Medical History:  Diagnosis Date   Breast cancer ([place]) 12/08/2018   invasive lobular   Family history of bladder cancer    Family history of colon cancer    Family history of lung cancer    Family history of ovarian cancer     PAST SURGICAL HISTORY: Past Surgical History:  Procedure Laterality Date   ADENOIDECTOMY     as a child   BREAST EXCISIONAL BIOPSY Left 2002   reconstructive surgery on nipple to match right breast   IR IMAGING GUIDED PORT INSERTION  03/08/2019   IR REMOVAL TUN ACCESS W/ PORT W/O FL MOD SED  08/09/2019   ROBOTIC ASSISTED SALPINGO OOPHERECTOMY Bilateral 11/05/2019   Procedure: XI ROBOTIC [place];  Surgeon: Lafonda Mosses, MD;  Location: [facility];  Service: Gynecology;  Laterality: Bilateral;  Adenoidectomy as a child. She underwent total thyroidectomy at [facility]) 12/05/2019, with benign pathology (SP [phone]).  FAMILY HISTORY: Family  History  Problem Relation Age of Onset   Lung cancer Mother 27       smoker   Ovarian cancer Mother 58   Colon cancer Maternal Aunt        22s   Bladder Cancer Paternal Grandfather        death was unrelated   Cancer Paternal Aunt        through [facility], blood cancer  As of May 2020, patient's parents are both living, her father at age 2 and her mother at age 56. Her mother has a history of ovarian cancer, diagnosed in 2019, and lung cancer (stage IV), diagnosed in 2016. She tested negative for BRCA.  A maternal aunt had colon cancer in her 26s.  3 other siblings of the patient's mother have not had any cancer.  On the paternal side the paternal grandmother had bladder cancer.  A paternal great aunt died from cancer of the pancreas.     GYNECOLOGIC HISTORY:  Last menstrual period August 2020  menarche: 45 years old Age at first live birth: 45 years old GX P 3 LMP every month, 5 total days with 2 heavy Contraceptive no, husband with vasectomy. HRT n/a  Hysterectomy? no BSO? 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 Husband Tonya Whitehead is a physical therapist. He is biologically [place] and was adopted by a [place] family.  The patient lives at home with her husband and their 3 children: Tonya Whitehead  is 56, Tonya Whitehead is 3, and  Tonya Whitehead, 12.  The patient attends a [employer].    ADVANCED DIRECTIVES: Husband Tonya Whitehead is her HCPOA   HEALTH MAINTENANCE: Social History   Tobacco Use   Smoking status: Never   Smokeless tobacco: Never  Vaping Use   Vaping Use: Never used  Substance Use Topics   Alcohol use: Yes    Comment: rarely   Drug use: Never     Colonoscopy: never done  PAP: not on file  Bone density: never done   Allergies  Allergen Reactions   Gluten Meal Hives    Joint pain, upset stomach   Skin Adhesives [Cyanoacrylate] Hives   Sulfa Antibiotics     Unknown reaction " I was told I had a reaction when I was a baby "    Latex Hives    Developed hives from elastic.    Current Outpatient Medications  Medication Sig Dispense Refill   abemaciclib (VERZENIO) 100 MG tablet Take 1 tablet (100 mg total) by mouth 2 (two) times daily. Swallow tablets whole. Do not chew, crush, or split tablets before swallowing. Start 06/22/2021 60 tablet 6   cholecalciferol (VITAMIN D3) 25 MCG (1000 UNIT) tablet Take 1 tablet (1,000 Units total) by mouth daily.     loperamide (IMODIUM) 2 MG capsule Take 1-2 capsules (2-4 mg total) by mouth as needed for diarrhea or loose stools. 60 capsule 4   Omega-3 Fatty Acids (FISH OIL) 500 MG CAPS Take by mouth daily.  0   ondansetron (ZOFRAN) 8 MG tablet Take 1 tablet (8 mg total) by mouth every 8 (eight) hours as needed for nausea or vomiting. 20 tablet 3   promethazine (PHENERGAN) 25 MG tablet Take 0.5-1 tablets (12.5-25 mg total) by mouth every 6 (six) hours as needed for nausea or vomiting. 30 tablet 0   tamoxifen (NOLVADEX) 20 MG tablet TAKE ONE TABLET BY MOUTH DAILY 30 tablet 2   vitamin B-12 (CYANOCOBALAMIN) 500 MCG tablet Take 1 tablet (500 mcg total) by mouth daily.     No current facility-administered medications for this visit.    OBJECTIVE: Young white woman in no acute distress There were no vitals filed for this visit.   Wt Readings from Last 3 Encounters:  04/21/21 155 lb 14.4 oz (70.7 kg)  04/16/21 153 lb 9.6 oz (69.7 kg)  04/14/20 149 lb 11.2 oz (67.9 kg)   There is no height or weight on file to calculate BMI.    ECOG FS:1 - Symptomatic but completely ambulatory  Telemedicine visit 06/02/2021   LAB RESULTS:  CMP     Component Value Date/Time   NA 142 04/16/2021 0934   K 4.5 04/16/2021 0934   CL 107 04/16/2021 0934   CO2 27 04/16/2021 0934   GLUCOSE 100 (H) 04/16/2021 0934   BUN 11 04/16/2021 0934   CREATININE 0.77 04/16/2021 0934   CALCIUM 9.6 04/16/2021 0934   PROT 7.1 04/16/2021 0934   ALBUMIN 4.0 04/16/2021 0934   AST 24 04/16/2021 0934   ALT 51 (H)  04/16/2021 0934   ALKPHOS 81 04/16/2021 0934   BILITOT 0.4 04/16/2021 0934   GFRNONAA >60 04/16/2021 0934   GFRAA >60 04/14/2020 1052    No results found for: TOTALPROTELP, ALBUMINELP, A1GS, A2GS, BETS, BETA2SER, GAMS, MSPIKE, SPEI  No results found for: KPAFRELGTCHN, LAMBDASER, KAPLAMBRATIO  Lab Results  Component Value Date   WBC 5.1 04/16/2021   NEUTROABS 3.7 04/16/2021   HGB 12.7 04/16/2021   HCT  39.9 04/16/2021   MCV 92.1 04/16/2021   PLT 233 04/16/2021    No results found for: LABCA2  No components found for: MBWGYK599  No results for input(s): INR in the last 168 hours.  No results found for: LABCA2  No results found for: JTT017  No results found for: BLT903  No results found for: ESP233  No results found for: CA2729  No components found for: HGQUANT  No results found for: CEA1 / No results found for: CEA1   No results found for: AFPTUMOR  No results found for: CHROMOGRNA   No results found for: HGBA, HGBA2QUANT, HGBFQUANT, HGBSQUAN (Hemoglobinopathy evaluation)   No results found for: LDH  No results found for: IRON, TIBC, IRONPCTSAT (Iron and TIBC)  No results found for: FERRITIN  Urinalysis    Component Value Date/Time   COLORURINE STRAW (A) 10/29/2019 0932   APPEARANCEUR CLEAR 10/29/2019 0932   LABSPEC 1.003 (L) 10/29/2019 0932   PHURINE 6.0 10/29/2019 0932   GLUCOSEU NEGATIVE 10/29/2019 0932   HGBUR NEGATIVE 10/29/2019 0932   BILIRUBINUR NEGATIVE 10/29/2019 0932   KETONESUR NEGATIVE 10/29/2019 0932   PROTEINUR NEGATIVE 10/29/2019 0932   NITRITE NEGATIVE 10/29/2019 0932   LEUKOCYTESUR NEGATIVE 10/29/2019 0932    STUDIES: No results found.   ELIGIBLE FOR AVAILABLE RESEARCH PROTOCOL: no  ASSESSMENT: 45 y.o. [place] woman status post left breast biopsy x2 on 12/08/2018, for multicentric invasive lobular breast cancer, grade 2, estrogen and progesterone receptor positive, HER-2 nonamplified, with an MIB-1 of 3%  (a) bilateral  breast MRI 12/20/2018 shows multiple masses throughout the left breast, with nipple retraction, but no evidence of chest wall invasion, no abnormal left axillary lymph nodes, and no findings of concern in the right breast  (b) chest CT scan and bone scan 12/29/2018 show only an incidental goiter  (1) status post left skin sparing mastectomy at [facility] 01/16/2019 for an mpT3 pN1, stage IIA invasive lobular carcinoma, grade 2; repeat prognostic panel again estrogen and progesterone receptor strongly positive, HER-2 nonamplified.  (a) a total of 8 lymph nodes removed, one with macrometastasis (8 mm, no ECE)  (2) adjuvant chemotherapy consisting of doxorubicin and cyclophosphamide in dose dense fashion x4 started 03/13/2019, completed 04/24/2019, followed by weekly paclitaxel x12 started 05/09/2019, completed 08/01/2019.  (a) echo 03/09/2019 finds normal left ventricular function at 55-60%  (b) cycle 2 doxorubicin and cyclophosphamide delayed 1 week secondary to nausea problems  (3) adjuvant radiation 08/30/19 - 10/15/19  Site/dose:   The patient initially received a dose of 50.4 Gy in 28 fractions to the chest wall and supraclavicular region. This was delivered using a 3-D conformal, 4 field technique. The patient then received a boost to the mastectomy scar. This delivered an additional 10 Gy in 5 fractions using an en face electron field. The total dose was 60.4 Gy.  (4) tamoxifen started May 2021  (a) status post hysterectomy and bilateral salpingo-oophorectomy November 04, 2020 with benign pathology  (5) genetics testing 01/09/2019 through the [facility] STAT Breast Cancer Panel + Common Hereditary Cancers Panel found no deleterious mutations in ATM, BRCA1, BRCA2, CDH1, CHEK2, PALB2, PTEN, STK11 and TP53, APC, ATM, AXIN2, BARD1, BMPR1A, BRCA1, BRCA2, BRIP1, CDH1, CDKN2A (p14ARF), CDKN2A (p16INK4a), CKD4, CHEK2, CTNNA1, DICER1, EPCAM (Deletion/duplication testing only), GREM1 (promoter region  deletion/duplication testing only), KIT, MEN1, MLH1, MSH2, MSH3, MSH6, MUTYH, NBN, NF1, NHTL1, PALB2, PDGFRA, PMS2, POLD1, POLE, PTEN, RAD50, RAD51C, RAD51D SDHB, SDHC, SDHD, SMAD4, SMARCA4. STK11, TP53, TSC1, TSC2, and VHL.  The following genes were  evaluated for sequence changes only: SDHA and HOXB13 c.251G>A variant only.   (6) DIEP reconstruction planned  (7) abemaciclib/Verzenio started October 2022   PLAN: Dhiya tolerated Verzenio at standard doses moderately well.  She is having however unpredictable diarrhea and some fatigue and nausea.  I am going to drop the dose to 1 tablet daily for the next 2 weeks, which I think she will tolerate well and then we will go to 100 mg twice daily beginning late this month, after Thanksgiving's.  We will catch up again in mid December and if she is tolerating it well then the plan will be to continue it as planned   [place] C. Devaris Quirk, MD 06/02/21 10:50 AM Medical Oncology and Hematology [facility] [place], [place] [address] Tel. [phone]    Fax. [phone]   I, Wilburn Mylar, am acting as scribe for Dr. Virgie Dad. Lousie Calico.  I, Lurline Del MD, have reviewed the above documentation for accuracy and completeness, and I agree with the above.   *Total Encounter Time as defined by the Centers for Medicare and Medicaid Services includes, in addition to the face-to-face time of a patient visit (documented in the note above) non-face-to-face time: obtaining and reviewing outside history, ordering and reviewing medications, tests or procedures, care coordination (communications with other health care professionals or caregivers) and documentation in the medical record.

## 2021-06-02 ENCOUNTER — Telehealth: Payer: 59 | Admitting: Oncology

## 2021-06-02 ENCOUNTER — Inpatient Hospital Stay: Payer: 59 | Attending: Oncology | Admitting: Oncology

## 2021-06-02 DIAGNOSIS — Z90722 Acquired absence of ovaries, bilateral: Secondary | ICD-10-CM | POA: Insufficient documentation

## 2021-06-02 DIAGNOSIS — Z923 Personal history of irradiation: Secondary | ICD-10-CM | POA: Diagnosis not present

## 2021-06-02 DIAGNOSIS — Z79899 Other long term (current) drug therapy: Secondary | ICD-10-CM | POA: Diagnosis not present

## 2021-06-02 DIAGNOSIS — C50812 Malignant neoplasm of overlapping sites of left female breast: Secondary | ICD-10-CM | POA: Insufficient documentation

## 2021-06-02 DIAGNOSIS — Z9012 Acquired absence of left breast and nipple: Secondary | ICD-10-CM | POA: Diagnosis not present

## 2021-06-02 DIAGNOSIS — C50412 Malignant neoplasm of upper-outer quadrant of left female breast: Secondary | ICD-10-CM

## 2021-06-02 DIAGNOSIS — Z170 Estrogen receptor positive status [ER+]: Secondary | ICD-10-CM | POA: Diagnosis not present

## 2021-06-02 DIAGNOSIS — Z90710 Acquired absence of both cervix and uterus: Secondary | ICD-10-CM | POA: Diagnosis not present

## 2021-06-02 MED ORDER — ABEMACICLIB 100 MG PO TABS
100.0000 mg | ORAL_TABLET | Freq: Two times a day (BID) | ORAL | 6 refills | Status: DC
Start: 2021-06-02 — End: 2021-06-11

## 2021-06-10 ENCOUNTER — Encounter: Payer: Self-pay | Admitting: Oncology

## 2021-06-11 ENCOUNTER — Other Ambulatory Visit: Payer: Self-pay | Admitting: *Deleted

## 2021-06-16 ENCOUNTER — Inpatient Hospital Stay: Payer: 59

## 2021-06-16 ENCOUNTER — Inpatient Hospital Stay: Payer: 59 | Admitting: Oncology

## 2021-06-23 DIAGNOSIS — L821 Other seborrheic keratosis: Secondary | ICD-10-CM | POA: Diagnosis not present

## 2021-06-23 DIAGNOSIS — L814 Other melanin hyperpigmentation: Secondary | ICD-10-CM | POA: Diagnosis not present

## 2021-06-23 DIAGNOSIS — Z08 Encounter for follow-up examination after completed treatment for malignant neoplasm: Secondary | ICD-10-CM | POA: Diagnosis not present

## 2021-06-23 DIAGNOSIS — L7211 Pilar cyst: Secondary | ICD-10-CM | POA: Diagnosis not present

## 2021-06-23 DIAGNOSIS — Z85828 Personal history of other malignant neoplasm of skin: Secondary | ICD-10-CM | POA: Diagnosis not present

## 2021-06-23 DIAGNOSIS — D225 Melanocytic nevi of trunk: Secondary | ICD-10-CM | POA: Diagnosis not present

## 2021-06-29 ENCOUNTER — Other Ambulatory Visit (HOSPITAL_BASED_OUTPATIENT_CLINIC_OR_DEPARTMENT_OTHER): Payer: 59 | Admitting: Oncology

## 2021-06-29 DIAGNOSIS — Z170 Estrogen receptor positive status [ER+]: Secondary | ICD-10-CM

## 2021-06-29 DIAGNOSIS — C50412 Malignant neoplasm of upper-outer quadrant of left female breast: Secondary | ICD-10-CM

## 2021-06-29 NOTE — Progress Notes (Signed)
[place]  [phone] [phone]    ID: Tonya Whitehead DOB: [birth year]  MR#: [id number]  GPQ#:[id number]  Patient Care Team: Tonya Organ., MD as PCP - General (Internal Medicine) Tonya Whitehead, Tonya Dad, MD as Consulting Physician (Oncology) Tonya Hatch, MD as Consulting Physician (Surgical Oncology) Tonya Ore, MD as Consulting Physician (Plastic Surgery) Tonya Nigh, MD as Consulting Physician (Obstetrics and Gynecology) Tonya Mosses, MD as Consulting Physician (Gynecologic Oncology) Tonya Rudd, MD as Consulting Physician (Radiation Oncology) Tonya Newcomer, MD as Attending Physician (Radiology) Tonya Whitehead, Tonya Dad, MD as Consulting Physician (Oncology) Tonya Ore, MD as Referring Physician (Plastic Surgery) Tonya Cruel, MD OTHER MD:     CHIEF COMPLAINT: 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  I called her on 06/29/2021 just to make sure she was comfortable with that decision and she certainly is.  I reassured her that I am comfortable with that as well.  She is scheduled for DIEP reconstruction under Dr. Hardin Whitehead at [facility] on 08/28/2021.  She has been instructed to stop tamoxifen 2 to 3 weeks prior to the surgery.   REVIEW OF SYSTEMS: I did not do a full review of systems today as this was only a courtesy phone call   Tonya Whitehead: [place] x2 followed by 1 booster     HISTORY OF CURRENT ILLNESS: From the original intake note:  Tonya Whitehead notes a history of reconstructive left breast surgery in 2002 while living in [place], [place]. That was  for symmetry. The left breast accordingly had some scar tissue and she thought that was what she was noting until mid March, when she felt the left breast was changing more and the nipple seemed to be sinking subtly.  She brought this to Dr Tonya Whitehead attention and underwent bilateral diagnostic mammography with tomography and bilateral breast ultrasonography at [facility] on 12/01/2018 showing: breast density category C; findings concerning for extensive malignancy throughout the left breast predominately involving the upper- and lower-outer quadrants.  The mass was palpable within the upper outer left breast and there was left nipple retraction as well as indentation along the inferior margin of the left breast.  Targeted ultrasound showed three masses measuring 5.6 cm (2 o'clock position), 4.0 cm (close to the nipple and contiguous to the prior mass), and 3.3 cm (6 o'clock position.).  Also noted was a borderline left axillary lymph node adjacent to the left axillary artery (and so not easy to biopsy).  In the right breast was noted a 1.3 cm cyst.  On 12/08/2018 Tonya Whitehead proceeded to biopsy of 2 of the left breast areas in question. The pathology from this procedure ([id number]) showed: invasive mammary carcinoma, grade 2, in two cores (at 2 o'clock and 6 o'clock) with identical histomorphology; e-cadherin is negative, consistent with a lobular phenotype. Prognostic indicators significant for: estrogen receptor, 70% positive and progesterone receptor, 70% positive, both with strong staining intensity. Proliferation marker Ki67 at 3%. HER2 negative by immunohistochemistry (1+).  Note also the patient had a thyroid ultrasound 07/18/2018 showing bilateral solitary nodules, which were biopsied 08/09/2018.  Both showed follicular nodules, Bethesda category 2 (benign).  The patient's  subsequent history is as detailed below.   PAST MEDICAL HISTORY: Past Medical History:  Diagnosis Date   Breast cancer  ([place]) 12/08/2018   invasive lobular   Family history of bladder cancer    Family history of colon cancer    Family history of lung cancer    Family history of ovarian cancer     PAST SURGICAL HISTORY: Past Surgical History:  Procedure Laterality Date   ADENOIDECTOMY     as a child   BREAST EXCISIONAL BIOPSY Left 2002   reconstructive surgery on nipple to match right breast   IR IMAGING GUIDED PORT INSERTION  03/08/2019   IR REMOVAL TUN ACCESS W/ PORT W/O FL MOD SED  08/09/2019   ROBOTIC ASSISTED SALPINGO OOPHERECTOMY Bilateral 11/05/2019   Procedure: XI ROBOTIC [place];  Surgeon: Tonya Mosses, MD;  Location: [facility];  Service: Gynecology;  Laterality: Bilateral;  Adenoidectomy as a child. She underwent total thyroidectomy at [facility]) 12/05/2019, with benign pathology (SP [phone]).  FAMILY HISTORY: Family History  Problem Relation Age of Onset   Lung cancer Mother 60       smoker   Ovarian cancer Mother 56   Colon cancer Maternal Aunt        36s   Bladder Cancer Paternal Grandfather        death was unrelated   Cancer Paternal Aunt        through [facility], blood cancer  As of May 2020, patient's parents are both living, her father at age 7 and her mother at age 68. Her mother has a history of ovarian cancer, diagnosed in 2019, and lung cancer (stage IV), diagnosed in 2016. She tested negative for BRCA.  A maternal aunt had colon cancer in her 61s.  3 other siblings of the patient's mother have not had any cancer.  On the paternal side the paternal grandmother had bladder cancer.  A paternal great aunt died from cancer of the pancreas.     GYNECOLOGIC HISTORY:  Last menstrual period August 2020  menarche: 45 years old Age at first live birth: 45 years old GX P 3 LMP every month, 5 total days with 2 heavy Contraceptive no, husband with vasectomy. HRT n/a  Hysterectomy? no BSO? 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 Husband Tonya Whitehead is a physical therapist. He is biologically [place] and was adopted by a [place] family.  The patient lives at home with her husband and their 3 children: [name] is 79, [name] is 87, and  [name], 50.  The patient attends a [employer].    ADVANCED DIRECTIVES: Husband Tonya Whitehead is her HCPOA   HEALTH MAINTENANCE: Social History   Tobacco Use   Smoking status: Never   Smokeless tobacco: Never  Vaping Use   Vaping Use: Never used  Substance Use Topics   Alcohol use: Yes    Comment: rarely   Drug use: Never     Colonoscopy: never done  PAP: not on file  Bone density: never done   Allergies  Allergen Reactions   Gluten Meal Hives    Joint pain, upset stomach   Skin Adhesives [Cyanoacrylate] Hives   Sulfa Antibiotics     Unknown reaction " I was told I had a reaction when I was a baby "   Latex Hives    Developed hives from elastic.    Current Outpatient Medications  Medication Sig Dispense Refill  cholecalciferol (VITAMIN D3) 25 MCG (1000 UNIT) tablet Take 1 tablet (1,000 Units total) by mouth daily.     loperamide (IMODIUM) 2 MG capsule Take 1-2 capsules (2-4 mg total) by mouth as needed for diarrhea or loose stools. 60 capsule 4   Omega-3 Fatty Acids (FISH OIL) 500 MG CAPS Take by mouth daily.  0   ondansetron (ZOFRAN) 8 MG tablet Take 1 tablet (8 mg total) by mouth every 8 (eight) hours as needed for nausea or vomiting. 20 tablet 3   promethazine (PHENERGAN) 25 MG tablet Take 0.5-1 tablets (12.5-25 mg total) by mouth every 6 (six) hours as needed for nausea or vomiting. 30 tablet 0   tamoxifen (NOLVADEX) 20 MG tablet TAKE ONE TABLET BY MOUTH DAILY 30 tablet 2   vitamin B-12 (CYANOCOBALAMIN) 500 MCG tablet Take 1 tablet (500 mcg total) by mouth daily.     No current facility-administered medications for this visit.    OBJECTIVE: Young white woman in no acute distress There were no vitals filed for this  visit.   Wt Readings from Last 3 Encounters:  04/21/21 155 lb 14.4 oz (70.7 kg)  04/16/21 153 lb 9.6 oz (69.7 kg)  04/14/20 149 lb 11.2 oz (67.9 kg)   There is no height or weight on file to calculate BMI.    ECOG FS:1 - Symptomatic but completely ambulatory  Telemedicine visit 06/02/2021   LAB RESULTS:  CMP     Component Value Date/Time   NA 142 04/16/2021 0934   K 4.5 04/16/2021 0934   CL 107 04/16/2021 0934   CO2 27 04/16/2021 0934   GLUCOSE 100 (H) 04/16/2021 0934   BUN 11 04/16/2021 0934   CREATININE 0.77 04/16/2021 0934   CALCIUM 9.6 04/16/2021 0934   PROT 7.1 04/16/2021 0934   ALBUMIN 4.0 04/16/2021 0934   AST 24 04/16/2021 0934   ALT 51 (H) 04/16/2021 0934   ALKPHOS 81 04/16/2021 0934   BILITOT 0.4 04/16/2021 0934   GFRNONAA >60 04/16/2021 0934   GFRAA >60 04/14/2020 1052    No results found for: TOTALPROTELP, ALBUMINELP, A1GS, A2GS, BETS, BETA2SER, GAMS, MSPIKE, SPEI  No results found for: KPAFRELGTCHN, LAMBDASER, [facility]  Lab Results  Component Value Date   WBC 5.1 04/16/2021   NEUTROABS 3.7 04/16/2021   HGB 12.7 04/16/2021   HCT 39.9 04/16/2021   MCV 92.1 04/16/2021   PLT 233 04/16/2021    No results found for: LABCA2  No components found for: [id number]  No results for input(s): INR in the last 168 hours.  No results found for: LABCA2  No results found for: WXI561  No results found for: ZFV803  No results found for: YZN634  No results found for: CA2729  No components found for: HGQUANT  No results found for: CEA1 / No results found for: CEA1   No results found for: AFPTUMOR  No results found for: CHROMOGRNA   No results found for: HGBA, HGBA2QUANT, HGBFQUANT, HGBSQUAN (Hemoglobinopathy evaluation)   No results found for: LDH  No results found for: IRON, TIBC, IRONPCTSAT (Iron and TIBC)  No results found for: FERRITIN  Urinalysis    Component Value Date/Time   COLORURINE STRAW (A) 10/29/2019 0932   APPEARANCEUR  CLEAR 10/29/2019 0932   LABSPEC 1.003 (L) 10/29/2019 0932   PHURINE 6.0 10/29/2019 0932   GLUCOSEU NEGATIVE 10/29/2019 0932   HGBUR NEGATIVE 10/29/2019 0932   BILIRUBINUR NEGATIVE 10/29/2019 0932   KETONESUR NEGATIVE 10/29/2019 0932   PROTEINUR NEGATIVE 10/29/2019 0932  NITRITE NEGATIVE 10/29/2019 0932   LEUKOCYTESUR NEGATIVE 10/29/2019 0932    STUDIES: No results found.   ELIGIBLE FOR AVAILABLE RESEARCH PROTOCOL: no  ASSESSMENT: 45 y.o. [place] woman status post left breast biopsy x2 on 12/08/2018, for multicentric invasive lobular breast cancer, grade 2, estrogen and progesterone receptor positive, HER-2 nonamplified, with an MIB-1 of 3%  (a) bilateral breast MRI 12/20/2018 shows multiple masses throughout the left breast, with nipple retraction, but no evidence of chest wall invasion, no abnormal left axillary lymph nodes, and no findings of concern in the right breast  (b) chest CT scan and bone scan 12/29/2018 show only an incidental goiter  (1) status post left skin sparing mastectomy at [facility] 01/16/2019 for an mpT3 pN1, stage IIA invasive lobular carcinoma, grade 2; repeat prognostic panel again estrogen and progesterone receptor strongly positive, HER-2 nonamplified.  (a) a total of 8 lymph nodes removed, one with macrometastasis (8 mm, no ECE)  (2) adjuvant chemotherapy consisting of doxorubicin and cyclophosphamide in dose dense fashion x4 started 03/13/2019, completed 04/24/2019, followed by weekly paclitaxel x12 started 05/09/2019, completed 08/01/2019.  (a) echo 03/09/2019 finds normal left ventricular function at 55-60%  (b) cycle 2 doxorubicin and cyclophosphamide delayed 1 week secondary to nausea problems  (3) adjuvant radiation 08/30/19 - 10/15/19  Site/dose:   The patient initially received a dose of 50.4 Gy in 28 fractions to the chest wall and supraclavicular region. This was delivered using a 3-D conformal, 4 field technique. The patient then received a boost to  the mastectomy scar. This delivered an additional 10 Gy in 5 fractions using an en face electron field. The total dose was 60.4 Gy.  (4) tamoxifen started May 2021  (a) status post hysterectomy and bilateral salpingo-oophorectomy November 04, 2020 with benign pathology  (5) genetics testing 01/09/2019 through the [facility] STAT Breast Cancer Panel + Common Hereditary Cancers Panel found no deleterious mutations in ATM, BRCA1, BRCA2, CDH1, CHEK2, PALB2, PTEN, STK11 and TP53, APC, ATM, AXIN2, BARD1, BMPR1A, BRCA1, BRCA2, BRIP1, CDH1, CDKN2A (p14ARF), CDKN2A (p16INK4a), CKD4, CHEK2, CTNNA1, DICER1, EPCAM (Deletion/duplication testing only), GREM1 (promoter region deletion/duplication testing only), KIT, MEN1, MLH1, MSH2, MSH3, MSH6, MUTYH, NBN, NF1, NHTL1, PALB2, PDGFRA, PMS2, POLD1, POLE, PTEN, RAD50, RAD51C, RAD51D SDHB, SDHC, SDHD, SMAD4, SMARCA4. STK11, TP53, TSC1, TSC2, and VHL.  The following genes were evaluated for sequence changes only: SDHA and HOXB13 c.251G>A variant only.   (6) DIEP reconstruction scheduled for 08/28/2021 ([facility]).  (7) abemaciclib/Verzenio started October 2022, discontinued 06/10/2021 with multiple side effects   PLAN: Emogene is comfortable with her decision stopping Verzenio and sulfonamide.  She already has an appointment to see [place] October 2023 and she will keep that appointment.  She will let us know if she needs to see [place] before then for any reason.   Tonya Whitehead. Anderson Middlebrooks, MD 06/29/21 3:52 PM Medical Oncology and Hematology [facility] [place], [place] [address] Tel. [phone]    Fax. [phone]   I, Wilburn Mylar, am acting as scribe for Dr. Virgie Whitehead. Tonya Whitehead.  I, Lurline Del MD, have reviewed the above documentation for accuracy and completeness, and I agree with the above.   *Total Encounter Time as defined by the Centers for Medicare and Medicaid Services includes, in addition to the face-to-face time of a patient visit  (documented in the note above) non-face-to-face time: obtaining and reviewing outside history, ordering and reviewing medications, tests or procedures, care coordination (communications with other health care professionals or caregivers) and  documentation in the medical record.

## 2021-07-13 ENCOUNTER — Other Ambulatory Visit: Payer: Self-pay | Admitting: Oncology

## 2021-08-03 DIAGNOSIS — R002 Palpitations: Secondary | ICD-10-CM | POA: Diagnosis not present

## 2021-08-03 DIAGNOSIS — H8110 Benign paroxysmal vertigo, unspecified ear: Secondary | ICD-10-CM | POA: Diagnosis not present

## 2021-08-03 DIAGNOSIS — Z170 Estrogen receptor positive status [ER+]: Secondary | ICD-10-CM | POA: Diagnosis not present

## 2021-08-03 DIAGNOSIS — E890 Postprocedural hypothyroidism: Secondary | ICD-10-CM | POA: Diagnosis not present

## 2021-08-03 DIAGNOSIS — R768 Other specified abnormal immunological findings in serum: Secondary | ICD-10-CM | POA: Diagnosis not present

## 2021-08-03 DIAGNOSIS — C50912 Malignant neoplasm of unspecified site of left female breast: Secondary | ICD-10-CM | POA: Diagnosis not present

## 2021-08-03 DIAGNOSIS — R42 Dizziness and giddiness: Secondary | ICD-10-CM | POA: Diagnosis not present

## 2021-09-24 ENCOUNTER — Telehealth: Payer: Self-pay

## 2021-09-24 NOTE — Telephone Encounter (Signed)
Pt called and states for about a month she is having muscle pain in her calves & feet, mostly at HS which keeps her awake. Pt asks if this is related to Tamoxifen. Pt was advised to stop Tamoxifen for 2 weeks, if muscle pains persist, call PCP for work up. Pt asks how she can be tested for MS or RA as she is concerned she has an autoimmune disorder. I advised pt to start with pausing the use of Tamoxifen for now then resume or call us back to f/u on how she is feeling. She verbalized thanks and understanding.  ?

## 2021-10-14 ENCOUNTER — Other Ambulatory Visit: Payer: Self-pay | Admitting: *Deleted

## 2021-10-14 MED ORDER — TAMOXIFEN CITRATE 20 MG PO TABS: 20.0000 mg | ORAL_TABLET | Freq: Every day | ORAL | 2 refills | Status: DC

## 2021-10-15 ENCOUNTER — Telehealth: Payer: Self-pay | Admitting: *Deleted

## 2021-10-15 NOTE — Telephone Encounter (Signed)
This RN returned call to pt per her update with symptoms that she states has resolved with stopping the tamoxifen on 09/24/2021. ?  ?This RN obtained her identified VM and left a detailed message stating need for use of a antiestrogen for greatest benefit for not recurring. ? ?Note pt has had a BSO in 2021. ? ?This RN requested a return call to discuss further and possible need for an appt for further discussion. ? ? ?

## 2021-10-15 NOTE — Telephone Encounter (Signed)
This RN returned call to pt per her update with symptoms that she states has resolved with stopping the tamoxifen on 09/24/2021. ? ?This RN obtained her identified VM and left a detailed message stating need for use of a antiestrogen for greatest benefit. ?Presently tamoxifen is the only medication for premenopausal women ?

## 2021-10-21 NOTE — Progress Notes (Signed)
? ?Patient Care Team: ?Ginger Organ., MD as PCP - General (Internal Medicine) ?Magrinat, Virgie Dad, MD (Inactive) as Consulting Physician (Oncology) ?Sheliah Hatch, MD as Consulting Physician (Surgical Oncology) ?Stann Ore, MD as Consulting Physician (Plastic Surgery) ?Arvella Nigh, MD as Consulting Physician (Obstetrics and Gynecology) ?Lafonda Mosses, MD as Consulting Physician (Gynecologic Oncology) ?Kyung Rudd, MD as Consulting Physician (Radiation Oncology) ?Lovey Newcomer, MD as Attending Physician (Radiology) ?Magrinat, Virgie Dad, MD (Inactive) as Consulting Physician (Oncology) ?Stann Ore, MD as Referring Physician (Plastic Surgery) ? ?DIAGNOSIS:  ?Encounter Diagnosis  ?Name Primary?  ? Malignant neoplasm of overlapping sites of left breast in female, estrogen receptor positive ([place])   ? ? ?SUMMARY OF ONCOLOGIC HISTORY: ?Oncology History  ?Malignant neoplasm of overlapping sites of left breast in female, estrogen receptor positive ([place])  ?12/08/2018 Surgery  ? Left breast needle biopsy [name]) [phone]): ?Breast, left, needle core biopsy, 2 o'clock: invasive mammary carcinoma with mammary carcinoma in situ, grade 2 ?Breast, left, needle core biopsy, 6 o'clock: invasive mammary carcinoma with mammary carcinoma in situ, grade 2 ?  ?12/13/2018 Initial Diagnosis  ? Malignant neoplasm of overlapping sites of left breast in female, estrogen receptor positive ([place]) ?  ?01/10/2019 Genetic Testing  ? Genetic testing reported out on 01/09/2019 through the [facility] STAT + Common Hereditary cancer panel found no pathogenic mutations. ?  ?The STAT Breast cancer panel offered by [facility] includes sequencing and rearrangement analysis for the following 9 genes:  ATM, BRCA1, BRCA2, CDH1, CHEK2, PALB2, PTEN, STK11 and TP53.   ?  ?The Common Hereditary Cancers Panel offered by [facility] includes sequencing and/or deletion duplication testing of the following 47 genes: APC, ATM, AXIN2,  BARD1, BMPR1A, BRCA1, BRCA2, BRIP1, CDH1, CDKN2A (p14ARF), CDKN2A (p16INK4a), CKD4, CHEK2, CTNNA1, DICER1, EPCAM (Deletion/duplication testing only), GREM1 (promoter region deletion/duplication testing only), KIT, MEN1, MLH1, MSH2, MSH3, MSH6, MUTYH, NBN, NF1, NHTL1, PALB2, PDGFRA, PMS2, POLD1, POLE, PTEN, RAD50, RAD51C, RAD51D,SDHB, SDHC, SDHD, SMAD4, SMARCA4. STK11, TP53, TSC1, TSC2, and VHL.  The following genes were evaluated for sequence changes only: SDHA and HOXB13 c.251G>A variant only. ?  ?01/16/2019 Cancer Staging  ? Staging form: Breast, AJCC 8th Edition ?- Pathologic stage from 01/16/2019: Stage IB (pT3, pN1a, cM0, G2, ER+, PR+, HER2-) ?  ?01/16/2019 Surgery  ? left skin sparing mastectomy at [facility]: mpT3 pN1, stage IIA invasive lobular carcinoma, grade 2; ER/PR+, HER-2 nonamplified. 1/8 lymph nodes positive for carcinoma. ?  ?03/13/2019 - 08/01/2019 Chemotherapy  ? DOXOrubicin (ADRIAMYCIN) chemo injection 104 mg, 60 mg/m2 = 104 mg, Intravenous,  Once, 4 of 4 cycles. Administration: 104 mg (03/13/2019), 104 mg (03/27/2019), 104 mg (04/10/2019), 104 mg (04/24/2019) ? ?palonosetron (ALOXI) injection 0.25 mg, 0.25 mg, Intravenous,  Once, 16 of 16 cycles. Administration: 0.25 mg (03/13/2019), 0.25 mg (03/27/2019), 0.25 mg (04/10/2019), 0.25 mg (04/24/2019), 0.25 mg (05/09/2019), 0.25 mg (05/23/2019), 0.25 mg (05/30/2019), 0.25 mg (06/06/2019), 0.25 mg (06/13/2019), 0.25 mg (06/20/2019), 0.25 mg (06/27/2019), 0.25 mg (07/04/2019), 0.25 mg (07/11/2019), 0.25 mg (07/18/2019), 0.25 mg (07/25/2019), 0.25 mg (08/01/2019) ? ?pegfilgrastim (NEULASTA ONPRO KIT) injection 6 mg, 6 mg, Subcutaneous, Once, 4 of 4 cycles. Administration: 6 mg (03/13/2019), 6 mg (03/27/2019), 6 mg (04/10/2019), 6 mg (04/24/2019) ? ?cyclophosphamide (CYTOXAN) 1,040 mg in sodium chloride 0.9 % 250 mL chemo infusion, 600 mg/m2 = 1,040 mg, Intravenous,  Once, 4 of 4 cycles. Administration: 1,040 mg (03/13/2019), 1,040 mg (03/27/2019), 1,040 mg (04/10/2019), 1,040 mg  (04/24/2019) ? ?PACLitaxel (TAXOL) 138 mg in sodium chloride 0.9 %  250 mL chemo infusion (</= 63m/m2), 80 mg/m2 = 138 mg, Intravenous,  Once, 12 of 12 cycles. Dose modification: 60 mg/m2 (original dose 80 mg/m2, Cycle 8, Reason: Provider Judgment), 72 mg/m2 (original dose 72 mg/m2, Cycle 7). Administration: 138 mg (05/09/2019), 102 mg (05/30/2019), 102 mg (06/06/2019), 102 mg (06/13/2019), 102 mg (06/20/2019), 102 mg (06/27/2019), 102 mg (07/04/2019), 102 mg (07/11/2019), 102 mg (07/18/2019), 102 mg (07/25/2019), 102 mg (08/01/2019) ? ?fosaprepitant (EMEND) 150 mg, dexamethasone (DECADRON) 12 mg in sodium chloride 0.9 % 145 mL IVPB, , Intravenous,  Once, 4 of 4 cycles. Administration:  (03/13/2019),  (03/27/2019),  (04/10/2019),  (04/24/2019) ?  ?08/30/2019 - 10/15/2019 Radiation Therapy  ? The patient initially received a dose of 50.4 Gy in 28 fractions to the chest wall and supraclavicular region. This was delivered using a 3-D conformal, 4 field technique. The patient then received a boost to the mastectomy scar. This delivered an additional 10 Gy in 5 fractions using an en face electron field. The total dose was 60.4 Gy. ?  ?11/2019 - 11/2024 Anti-estrogen oral therapy  ? Tamoxifen ?  ? ? 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 ?INTERVAL HISTORY: Tonya Livengoodis a 46y.o. 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 She presents to the clinic today for a follow-up. She complained of bad muscle spasm in legs from the tamoxifen. She also complained of blurred vision and brain fog. She states that the hot flashes has calm down. ? ? ?ALLERGIES:  is allergic to gluten meal, skin adhesives [cyanoacrylate], sulfa antibiotics, and latex. ? ?MEDICATIONS:  ?Current Outpatient Medications  ?Medication Sig Dispense Refill  ? letrozole (FEMARA) 2.5 MG tablet Take 1 tablet (2.5 mg total) by mouth daily. 90 tablet 3  ? cholecalciferol (VITAMIN D3) 25 MCG  (1000 UNIT) tablet Take 1 tablet (1,000 Units total) by mouth daily.    ? Omega-3 Fatty Acids (FISH OIL) 500 MG CAPS Take by mouth daily.  0  ? vitamin B-12 (CYANOCOBALAMIN) 500 MCG tablet Take 1 tablet (500 mcg total) by mouth daily.    ? ?No current facility-administered medications for this visit.  ? ? ?PHYSICAL EXAMINATION: ?ECOG PERFORMANCE STATUS: 1 - Symptomatic but completely ambulatory ? ?Vitals:  ? 10/22/21 0820  ?BP: 112/69  ?Pulse: 69  ?Resp: 18  ?Temp: (!) 97.5 ?F (36.4 ?C)  ?SpO2: 97%  ? ?Filed Weights  ? 10/22/21 0820  ?Weight: 153 lb 6.4 oz (69.6 kg)  ?  ? ?LABORATORY DATA:  ?I have reviewed the data as listed ? ?  Latest Ref Rng & Units 04/16/2021  ?  9:34 AM 04/14/2020  ? 10:52 AM 10/29/2019  ?  9:32 AM  ?CMP  ?Glucose 70 - 99 mg/dL 100   50   79    ?BUN 6 - 20 mg/dL _0 ?Creatinine 0.44 - 1.00 mg/dL 0.77   0.72   0.61    ?Sodium 135 - 145 mmol/L 142   140   141    ?Potassium 3.5 - 5.1 mmol/L 4.5   4.1   4.1    ?Chloride 98 - 111 mmol/L 107   105   105    ?CO2 22 - 32 mmol/L _1 ?Calcium 8.9 - 10.3 mg/dL 9.6   9.3   9.3    ?Total Protein 6.5 - 8.1 g/dL 7.1   6.7     ?  Total Bilirubin 0.3 - 1.2 mg/dL 0.4   0.5     ?Alkaline Phos 38 - 126 U/L 81   70     ?AST 15 - 41 U/L 24   21     ?ALT 0 - 44 U/L 51   38     ? ? ?Lab Results  ?Component Value Date  ? WBC 5.1 04/16/2021  ? HGB 12.7 04/16/2021  ? HCT 39.9 04/16/2021  ? MCV 92.1 04/16/2021  ? PLT 233 04/16/2021  ? NEUTROABS 3.7 04/16/2021  ? ? ?ASSESSMENT & PLAN:  ?Malignant neoplasm of overlapping sites of left breast in female, estrogen receptor positive ([place]) ?12/08/2018: Left breast biopsy x2: Multicentric ILC grade 2, ER/PR positive HER2 negative Ki-67 3% ?01/16/2019: Left mastectomy: T3N1 stage IIa ILC grade 2 ER/PR positive HER2 -1/8 lymph nodes positive ([facility]) ?03/13/2019-08/01/2019: Adriamycin and Cytoxan x4 followed by weekly Taxol x12 ?08/30/2019-10/15/2019: Adjuvant radiation ?12/13/2019: Tamoxifen ?11/04/2020: Hysterectomy and  bilateral salpingo-oophorectomy ?01/09/2019: Genetics negative ?08/28/2021: DIEP reconstruction ?04/2021-06/10/2021: Verzenio (discontinued due to side effects) ? ?Current treatment: Patient was on tamoxifen which w

## 2021-10-21 NOTE — Assessment & Plan Note (Addendum)
12/08/2018: Left breast biopsy x2: Multicentric ILC grade 2, ER/PR positive HER2 negative Ki-67 3% ?01/16/2019: Left mastectomy: T3N1 stage IIa ILC grade 2 ER/PR positive HER2 -1/8 lymph nodes positive ([facility]) ?03/13/2019-08/01/2019: Adriamycin and Cytoxan x4 followed by weekly Taxol x12 ?08/30/2019-10/15/2019: Adjuvant radiation ?12/13/2019: Tamoxifen ?11/04/2020: Hysterectomy and bilateral salpingo-oophorectomy ?01/09/2019: Genetics negative ?08/28/2021: DIEP reconstruction ?04/2021-06/10/2021: Verzenio (discontinued due to side effects) ? ?Current treatment: Patient was on tamoxifen which was discontinued because of adverse effects. ?Breast cancer surveillance: ?1.  Breast exam 10/22/2021: Bilateral breast reconstruction ?2. no role of imaging studies since she had bilateral mastectomies ? ?Antiestrogen therapy counseling: I discussed with her that since she was having adverse effects to tamoxifen, we can switch her to aromatase inhibitor therapy. ? ?Letrozole counseling: We discussed the risks and benefits of anti-estrogen therapy with aromatase inhibitors. These include but not limited to insomnia, hot flashes, mood changes, vaginal dryness, bone density loss, and weight gain. We strongly believe that the benefits far outweigh the risks. Patient understands these risks and consented to starting treatment. Planned treatment duration is 7 years. ? ?Return to clinic in 3 months for follow-up ? ?

## 2021-10-22 ENCOUNTER — Other Ambulatory Visit: Payer: Self-pay

## 2021-10-22 ENCOUNTER — Inpatient Hospital Stay: Payer: 59 | Attending: Hematology and Oncology | Admitting: Hematology and Oncology

## 2021-10-22 DIAGNOSIS — Z923 Personal history of irradiation: Secondary | ICD-10-CM | POA: Diagnosis not present

## 2021-10-22 DIAGNOSIS — Z79810 Long term (current) use of selective estrogen receptor modulators (SERMs): Secondary | ICD-10-CM | POA: Diagnosis not present

## 2021-10-22 DIAGNOSIS — C50812 Malignant neoplasm of overlapping sites of left female breast: Secondary | ICD-10-CM | POA: Insufficient documentation

## 2021-10-22 DIAGNOSIS — Z170 Estrogen receptor positive status [ER+]: Secondary | ICD-10-CM | POA: Insufficient documentation

## 2021-10-22 DIAGNOSIS — Z9012 Acquired absence of left breast and nipple: Secondary | ICD-10-CM | POA: Diagnosis not present

## 2021-10-22 MED ORDER — LETROZOLE 2.5 MG PO TABS: 2.5000 mg | ORAL_TABLET | Freq: Every day | ORAL | 3 refills | Status: DC

## 2021-11-03 ENCOUNTER — Encounter: Payer: Self-pay | Admitting: Hematology and Oncology

## 2021-11-04 ENCOUNTER — Other Ambulatory Visit: Payer: Self-pay | Admitting: *Deleted

## 2021-11-04 DIAGNOSIS — C50812 Malignant neoplasm of overlapping sites of left female breast: Secondary | ICD-10-CM

## 2021-11-04 NOTE — Progress Notes (Addendum)
Received message from pt with complaint of ongoing confusion, loss of words, and floaters in peripheral vision.  MD out of office, verbal orders received by covering MD for pt to undergo Brain MRI for metastatic disease evaluation.  Orders placed.  ?

## 2021-11-10 ENCOUNTER — Ambulatory Visit (HOSPITAL_COMMUNITY)
Admission: RE | Admit: 2021-11-10 | Discharge: 2021-11-10 | Disposition: A | Discharge status: Home / Self Care | Payer: 59 | Source: Ambulatory Visit | Attending: Hematology and Oncology | Admitting: Hematology and Oncology

## 2021-11-10 DIAGNOSIS — Z170 Estrogen receptor positive status [ER+]: Secondary | ICD-10-CM | POA: Diagnosis not present

## 2021-11-10 DIAGNOSIS — C50812 Malignant neoplasm of overlapping sites of left female breast: Secondary | ICD-10-CM | POA: Diagnosis not present

## 2021-11-10 DIAGNOSIS — R410 Disorientation, unspecified: Secondary | ICD-10-CM | POA: Diagnosis not present

## 2021-11-10 MED ORDER — GADOBUTROL 1 MMOL/ML IV SOLN
7.0000 mL | Freq: Once | INTRAVENOUS | Status: AC | PRN
  Administered 2021-11-10: 7 mL via INTRAVENOUS

## 2021-11-11 DIAGNOSIS — R42 Dizziness and giddiness: Secondary | ICD-10-CM | POA: Diagnosis not present

## 2021-11-11 DIAGNOSIS — C50912 Malignant neoplasm of unspecified site of left female breast: Secondary | ICD-10-CM | POA: Diagnosis not present

## 2021-11-11 DIAGNOSIS — M8580 Other specified disorders of bone density and structure, unspecified site: Secondary | ICD-10-CM | POA: Diagnosis not present

## 2021-11-11 DIAGNOSIS — H8110 Benign paroxysmal vertigo, unspecified ear: Secondary | ICD-10-CM | POA: Diagnosis not present

## 2021-11-11 DIAGNOSIS — R768 Other specified abnormal immunological findings in serum: Secondary | ICD-10-CM | POA: Diagnosis not present

## 2021-11-11 DIAGNOSIS — E890 Postprocedural hypothyroidism: Secondary | ICD-10-CM | POA: Diagnosis not present

## 2021-11-11 DIAGNOSIS — E236 Other disorders of pituitary gland: Secondary | ICD-10-CM | POA: Diagnosis not present

## 2022-01-19 ENCOUNTER — Encounter: Payer: Self-pay | Admitting: Hematology and Oncology

## 2022-01-21 ENCOUNTER — Inpatient Hospital Stay: Payer: 59 | Admitting: Hematology and Oncology

## 2022-03-06 DIAGNOSIS — Z1231 Encounter for screening mammogram for malignant neoplasm of breast: Secondary | ICD-10-CM | POA: Diagnosis not present

## 2022-03-10 DIAGNOSIS — M25531 Pain in right wrist: Secondary | ICD-10-CM | POA: Diagnosis not present

## 2022-03-10 DIAGNOSIS — M25532 Pain in left wrist: Secondary | ICD-10-CM | POA: Diagnosis not present

## 2022-03-30 DIAGNOSIS — Z0000 Encounter for general adult medical examination without abnormal findings: Secondary | ICD-10-CM | POA: Diagnosis not present

## 2022-03-30 DIAGNOSIS — R7301 Impaired fasting glucose: Secondary | ICD-10-CM | POA: Diagnosis not present

## 2022-03-30 DIAGNOSIS — E040 Nontoxic diffuse goiter: Secondary | ICD-10-CM | POA: Diagnosis not present

## 2022-03-30 DIAGNOSIS — E538 Deficiency of other specified B group vitamins: Secondary | ICD-10-CM | POA: Diagnosis not present

## 2022-03-30 DIAGNOSIS — M8580 Other specified disorders of bone density and structure, unspecified site: Secondary | ICD-10-CM | POA: Diagnosis not present

## 2022-04-01 DIAGNOSIS — Z0000 Encounter for general adult medical examination without abnormal findings: Secondary | ICD-10-CM | POA: Diagnosis not present

## 2022-04-06 DIAGNOSIS — R82998 Other abnormal findings in urine: Secondary | ICD-10-CM | POA: Diagnosis not present

## 2022-04-06 DIAGNOSIS — H8110 Benign paroxysmal vertigo, unspecified ear: Secondary | ICD-10-CM | POA: Diagnosis not present

## 2022-04-06 DIAGNOSIS — C50912 Malignant neoplasm of unspecified site of left female breast: Secondary | ICD-10-CM | POA: Diagnosis not present

## 2022-04-06 DIAGNOSIS — E785 Hyperlipidemia, unspecified: Secondary | ICD-10-CM | POA: Diagnosis not present

## 2022-04-06 DIAGNOSIS — E236 Other disorders of pituitary gland: Secondary | ICD-10-CM | POA: Diagnosis not present

## 2022-04-06 DIAGNOSIS — M8580 Other specified disorders of bone density and structure, unspecified site: Secondary | ICD-10-CM | POA: Diagnosis not present

## 2022-04-06 DIAGNOSIS — R7301 Impaired fasting glucose: Secondary | ICD-10-CM | POA: Diagnosis not present

## 2022-04-06 DIAGNOSIS — R768 Other specified abnormal immunological findings in serum: Secondary | ICD-10-CM | POA: Diagnosis not present

## 2022-04-06 DIAGNOSIS — E538 Deficiency of other specified B group vitamins: Secondary | ICD-10-CM | POA: Diagnosis not present

## 2022-04-06 DIAGNOSIS — E890 Postprocedural hypothyroidism: Secondary | ICD-10-CM | POA: Diagnosis not present

## 2022-04-06 DIAGNOSIS — Z170 Estrogen receptor positive status [ER+]: Secondary | ICD-10-CM | POA: Diagnosis not present

## 2022-04-06 DIAGNOSIS — Z1339 Encounter for screening examination for other mental health and behavioral disorders: Secondary | ICD-10-CM | POA: Diagnosis not present

## 2022-04-06 DIAGNOSIS — Z1331 Encounter for screening for depression: Secondary | ICD-10-CM | POA: Diagnosis not present

## 2022-04-06 DIAGNOSIS — Z0000 Encounter for general adult medical examination without abnormal findings: Secondary | ICD-10-CM | POA: Diagnosis not present

## 2022-04-06 DIAGNOSIS — Z8719 Personal history of other diseases of the digestive system: Secondary | ICD-10-CM | POA: Diagnosis not present

## 2022-04-25 NOTE — Progress Notes (Signed)
Patient Care Team: Ginger Organ., MD as PCP - General (Internal Medicine) Magrinat, Virgie Dad, MD (Inactive) as Consulting Physician (Oncology) Sheliah Hatch, MD as Consulting Physician (Surgical Oncology) Stann Ore, MD as Consulting Physician (Plastic Surgery) Arvella Nigh, MD as Consulting Physician (Obstetrics and Gynecology) Lafonda Mosses, MD as Consulting Physician (Gynecologic Oncology) Kyung Rudd, MD as Consulting Physician (Radiation Oncology) Lovey Newcomer, MD as Attending Physician (Radiology) Magrinat, Virgie Dad, MD (Inactive) as Consulting Physician (Oncology) Stann Ore, MD as Referring Physician (Plastic Surgery)  DIAGNOSIS: No diagnosis found.  SUMMARY OF ONCOLOGIC HISTORY: Oncology History  Malignant neoplasm of overlapping sites of left breast in female, estrogen receptor positive ([place])  12/08/2018 Surgery   Left breast needle biopsy [name]) [id number]): Breast, left, needle core biopsy, 2 o'clock: invasive mammary carcinoma with mammary carcinoma in situ, grade 2 Breast, left, needle core biopsy, 6 o'clock: invasive mammary carcinoma with mammary carcinoma in situ, grade 2   12/13/2018 Initial Diagnosis   Malignant neoplasm of overlapping sites of left breast in female, estrogen receptor positive ([place])   01/10/2019 Genetic Testing   Genetic testing reported out on 01/09/2019 through the [facility] STAT + Common Hereditary cancer panel found no pathogenic mutations.   The STAT Breast cancer panel offered by [facility] includes sequencing and rearrangement analysis for the following 9 genes:  ATM, BRCA1, BRCA2, CDH1, CHEK2, PALB2, PTEN, STK11 and TP53.     The Common Hereditary Cancers Panel offered by [facility] includes sequencing and/or deletion duplication testing of the following 47 genes: APC, ATM, AXIN2, BARD1, BMPR1A, BRCA1, BRCA2, BRIP1, CDH1, CDKN2A (p14ARF), CDKN2A (p16INK4a), CKD4, CHEK2, CTNNA1, DICER1, EPCAM  (Deletion/duplication testing only), GREM1 (promoter region deletion/duplication testing only), KIT, MEN1, MLH1, MSH2, MSH3, MSH6, MUTYH, NBN, NF1, NHTL1, PALB2, PDGFRA, PMS2, POLD1, POLE, PTEN, RAD50, RAD51C, RAD51D,SDHB, SDHC, SDHD, SMAD4, SMARCA4. STK11, TP53, TSC1, TSC2, and VHL.  The following genes were evaluated for sequence changes only: SDHA and HOXB13 c.251G>A variant only.   01/16/2019 Cancer Staging   Staging form: Breast, AJCC 8th Edition - Pathologic stage from 01/16/2019: Stage IB (pT3, pN1a, cM0, G2, ER+, PR+, HER2-)   01/16/2019 Surgery   left skin sparing mastectomy at [facility]: mpT3 pN1, stage IIA invasive lobular carcinoma, grade 2; ER/PR+, HER-2 nonamplified. 1/8 lymph nodes positive for carcinoma.   03/13/2019 - 08/01/2019 Chemotherapy   DOXOrubicin (ADRIAMYCIN) chemo injection 104 mg, 60 mg/m2 = 104 mg, Intravenous,  Once, 4 of 4 cycles. Administration: 104 mg (03/13/2019), 104 mg (03/27/2019), 104 mg (04/10/2019), 104 mg (04/24/2019)  palonosetron (ALOXI) injection 0.25 mg, 0.25 mg, Intravenous,  Once, 16 of 16 cycles. Administration: 0.25 mg (03/13/2019), 0.25 mg (03/27/2019), 0.25 mg (04/10/2019), 0.25 mg (04/24/2019), 0.25 mg (05/09/2019), 0.25 mg (05/23/2019), 0.25 mg (05/30/2019), 0.25 mg (06/06/2019), 0.25 mg (06/13/2019), 0.25 mg (06/20/2019), 0.25 mg (06/27/2019), 0.25 mg (07/04/2019), 0.25 mg (07/11/2019), 0.25 mg (07/18/2019), 0.25 mg (07/25/2019), 0.25 mg (08/01/2019)  pegfilgrastim (NEULASTA ONPRO KIT) injection 6 mg, 6 mg, Subcutaneous, Once, 4 of 4 cycles. Administration: 6 mg (03/13/2019), 6 mg (03/27/2019), 6 mg (04/10/2019), 6 mg (04/24/2019)  cyclophosphamide (CYTOXAN) 1,040 mg in sodium chloride 0.9 % 250 mL chemo infusion, 600 mg/m2 = 1,040 mg, Intravenous,  Once, 4 of 4 cycles. Administration: 1,040 mg (03/13/2019), 1,040 mg (03/27/2019), 1,040 mg (04/10/2019), 1,040 mg (04/24/2019)  PACLitaxel (TAXOL) 138 mg in sodium chloride 0.9 % 250 mL chemo infusion (</= 19m/m2), 80 mg/m2 = 138 mg,  Intravenous,  Once, 12 of 12 cycles. Dose modification: 60 mg/m2 (  original dose 80 mg/m2, Cycle 8, Reason: Provider Judgment), 72 mg/m2 (original dose 72 mg/m2, Cycle 7). Administration: 138 mg (05/09/2019), 102 mg (05/30/2019), 102 mg (06/06/2019), 102 mg (06/13/2019), 102 mg (06/20/2019), 102 mg (06/27/2019), 102 mg (07/04/2019), 102 mg (07/11/2019), 102 mg (07/18/2019), 102 mg (07/25/2019), 102 mg (08/01/2019)  fosaprepitant (EMEND) 150 mg, dexamethasone (DECADRON) 12 mg in sodium chloride 0.9 % 145 mL IVPB, , Intravenous,  Once, 4 of 4 cycles. Administration:  (03/13/2019),  (03/27/2019),  (04/10/2019),  (04/24/2019)   08/30/2019 - 10/15/2019 Radiation Therapy   The patient initially received a dose of 50.4 Gy in 28 fractions to the chest wall and supraclavicular region. This was delivered using a 3-D conformal, 4 field technique. The patient then received a boost to the mastectomy scar. This delivered an additional 10 Gy in 5 fractions using an en face electron field. The total dose was 60.4 Gy.   11/2019 - 11/2024 Anti-estrogen oral therapy   Tamoxifen     CHIEF COMPLIANT:  estrogen receptor positive breast cancer (s/p left mastectomy)    INTERVAL HISTORY: Tonya Whitehead is a  46 y.o. 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 She presents to the clinic today for a follow-up.     ALLERGIES:  is allergic to gluten meal, skin adhesives [cyanoacrylate], sulfa antibiotics, and latex.  MEDICATIONS:  Current Outpatient Medications  Medication Sig Dispense Refill   cholecalciferol (VITAMIN D3) 25 MCG (1000 UNIT) tablet Take 1 tablet (1,000 Units total) by mouth daily.     letrozole (FEMARA) 2.5 MG tablet Take 1 tablet (2.5 mg total) by mouth daily. 90 tablet 3   Omega-3 Fatty Acids (FISH OIL) 500 MG CAPS Take by mouth daily.  0   vitamin B-12 (CYANOCOBALAMIN) 500 MCG tablet Take 1 tablet (500 mcg total) by mouth daily.     No current facility-administered  medications for this visit.    PHYSICAL EXAMINATION: ECOG PERFORMANCE STATUS: {CHL ONC ECOG PS:[phone]}  There were no vitals filed for this visit. There were no vitals filed for this visit.  BREAST:*** No palpable masses or nodules in either right or left breasts. No palpable axillary supraclavicular or infraclavicular adenopathy no breast tenderness or nipple discharge. (exam performed in the presence of a chaperone)  LABORATORY DATA:  I have reviewed the data as listed    Latest Ref Rng & Units 04/16/2021    9:34 AM 04/14/2020   10:52 AM 10/29/2019    9:32 AM  CMP  Glucose 70 - 99 mg/dL 100  50  79   BUN 6 - 20 mg/dL '11  11  11   ' Creatinine 0.44 - 1.00 mg/dL 0.77  0.72  0.61   Sodium 135 - 145 mmol/L 142  140  141   Potassium 3.5 - 5.1 mmol/L 4.5  4.1  4.1   Chloride 98 - 111 mmol/L 107  105  105   CO2 22 - 32 mmol/L '27  30  27   ' Calcium 8.9 - 10.3 mg/dL 9.6  9.3  9.3   Total Protein 6.5 - 8.1 g/dL 7.1  6.7    Total Bilirubin 0.3 - 1.2 mg/dL 0.4  0.5    Alkaline Phos 38 - 126 U/L 81  70    AST 15 - 41 U/L 24  21    ALT 0 - 44 U/L 51  38      Lab Results  Component Value Date   WBC 5.1 04/16/2021   HGB 12.7  04/16/2021   HCT 39.9 04/16/2021   MCV 92.1 04/16/2021   PLT 233 04/16/2021   NEUTROABS 3.7 04/16/2021    ASSESSMENT & PLAN:  No problem-specific Assessment & Plan notes found for this encounter.    No orders of the defined types were placed in this encounter.  The patient has a good understanding of the overall plan. she agrees with it. she will call with any problems that may develop before the next visit here. Total time spent: 30 mins including face to face time and time spent for planning, charting and co-ordination of care   Suzzette Righter, [place] 04/25/22    I Gardiner Coins am scribing for Dr. Lindi Adie  ***

## 2022-04-26 ENCOUNTER — Other Ambulatory Visit: Payer: Self-pay | Admitting: *Deleted

## 2022-04-26 DIAGNOSIS — Z170 Estrogen receptor positive status [ER+]: Secondary | ICD-10-CM

## 2022-04-27 ENCOUNTER — Other Ambulatory Visit: Payer: Self-pay | Admitting: *Deleted

## 2022-04-27 ENCOUNTER — Inpatient Hospital Stay (HOSPITAL_BASED_OUTPATIENT_CLINIC_OR_DEPARTMENT_OTHER): Payer: 59 | Admitting: Hematology and Oncology

## 2022-04-27 ENCOUNTER — Inpatient Hospital Stay: Payer: 59 | Attending: Hematology and Oncology

## 2022-04-27 ENCOUNTER — Encounter: Payer: Self-pay | Admitting: Hematology and Oncology

## 2022-04-27 DIAGNOSIS — Z79899 Other long term (current) drug therapy: Secondary | ICD-10-CM | POA: Insufficient documentation

## 2022-04-27 DIAGNOSIS — Z79811 Long term (current) use of aromatase inhibitors: Secondary | ICD-10-CM | POA: Diagnosis not present

## 2022-04-27 DIAGNOSIS — C50812 Malignant neoplasm of overlapping sites of left female breast: Secondary | ICD-10-CM | POA: Insufficient documentation

## 2022-04-27 DIAGNOSIS — Z170 Estrogen receptor positive status [ER+]: Secondary | ICD-10-CM

## 2022-04-27 DIAGNOSIS — Z9012 Acquired absence of left breast and nipple: Secondary | ICD-10-CM | POA: Diagnosis not present

## 2022-04-27 LAB — CBC WITH DIFFERENTIAL (CANCER CENTER ONLY)
Abs Immature Granulocytes: 0.01 10*3/uL (ref 0.00–0.07)
Basophils Absolute: 0 10*3/uL (ref 0.0–0.1)
Basophils Relative: 1 %
Eosinophils Absolute: 0.1 10*3/uL (ref 0.0–0.5)
Eosinophils Relative: 2 %
HCT: 41 % (ref 36.0–46.0)
Hemoglobin: 13.5 g/dL (ref 12.0–15.0)
Immature Granulocytes: 0 %
Lymphocytes Relative: 16 %
Lymphs Abs: 1 10*3/uL (ref 0.7–4.0)
MCH: 29.7 pg (ref 26.0–34.0)
MCHC: 32.9 g/dL (ref 30.0–36.0)
MCV: 90.1 fL (ref 80.0–100.0)
Monocytes Absolute: 0.5 10*3/uL (ref 0.1–1.0)
Monocytes Relative: 7 %
Neutro Abs: 4.7 10*3/uL (ref 1.7–7.7)
Neutrophils Relative %: 74 %
Platelet Count: 249 10*3/uL (ref 150–400)
RBC: 4.55 MIL/uL (ref 3.87–5.11)
RDW: 13.3 % (ref 11.5–15.5)
WBC Count: 6.3 10*3/uL (ref 4.0–10.5)
nRBC: 0 % (ref 0.0–0.2)

## 2022-04-27 LAB — CMP (CANCER CENTER ONLY)
ALT: 33 U/L (ref 0–44)
AST: 22 U/L (ref 15–41)
Albumin: 4.5 g/dL (ref 3.5–5.0)
Alkaline Phosphatase: 89 U/L (ref 38–126)
Anion gap: 4 — ABNORMAL LOW (ref 5–15)
BUN: 9 mg/dL (ref 6–20)
CO2: 31 mmol/L (ref 22–32)
Calcium: 9.8 mg/dL (ref 8.9–10.3)
Chloride: 104 mmol/L (ref 98–111)
Creatinine: 0.69 mg/dL (ref 0.44–1.00)
GFR, Estimated: >60 mL/min (ref >60)
Glucose, Bld: 107 mg/dL — ABNORMAL HIGH (ref 70–99)
Potassium: 4 mmol/L (ref 3.5–5.1)
Sodium: 139 mmol/L (ref 135–145)
Total Bilirubin: 0.4 mg/dL (ref 0.3–1.2)
Total Protein: 6.9 g/dL (ref 6.5–8.1)

## 2022-04-27 MED ORDER — PANTOPRAZOLE SODIUM 20 MG PO TBEC: 20.0000 mg | DELAYED_RELEASE_TABLET | Freq: Every day | ORAL | 0 refills | Status: DC

## 2022-04-27 NOTE — Assessment & Plan Note (Addendum)
12/08/2018: Left breast biopsy x2: Multicentric ILC grade 2, ER/PR positive HER2 negative Ki-67 3% 01/16/2019: Left mastectomy: T3N1 stage IIa ILC grade 2 ER/PR positive HER2 -1/8 lymph nodes positive ([facility]) 03/13/2019-08/01/2019: Adriamycin and Cytoxan x4 followed by weekly Taxol x12 08/30/2019-10/15/2019: Adjuvant radiation 12/13/2019: Tamoxifen 11/04/2020: Hysterectomy and bilateral salpingo-oophorectomy 01/09/2019: Genetics negative 08/28/2021: DIEP reconstruction 04/2021-06/10/2021: Verzenio (discontinued due to side effects)  Current treatment: Patient was on tamoxifen which was discontinued because of adverse effects.  (Severe leg cramps)  Breast cancer surveillance: 1.  Breast exam 10/22/2021: Bilateral breast reconstruction 2. no role of imaging studies since she had bilateral mastectomies  Letrozole toxicities: Slight muscle aches and pains but otherwise tolerating it well.  She works as a Transport planner independently. Her daughter is going to college with the focus on production design/prosthetics for movies  She is interested in doing [employer] testing we will set her up for that test.  Return to clinic in 1 year for follow-up

## 2022-04-27 NOTE — Progress Notes (Signed)
Per MD request RN successfully faxed [employer] testing request [phone]).

## 2022-04-28 ENCOUNTER — Telehealth: Payer: Self-pay | Admitting: Hematology and Oncology

## 2022-04-28 NOTE — Telephone Encounter (Signed)
Scheduled appointment per 10/3 los. Left voicemail.

## 2022-05-11 ENCOUNTER — Other Ambulatory Visit: Payer: Self-pay | Admitting: Hematology and Oncology

## 2022-05-23 ENCOUNTER — Other Ambulatory Visit: Payer: Self-pay | Admitting: Hematology and Oncology

## 2022-05-26 ENCOUNTER — Encounter: Payer: Self-pay | Admitting: *Deleted

## 2022-05-26 NOTE — Progress Notes (Signed)
Receive a message from [employer] representative stating patient did not respond to their call to schedule mobile phlebotomy draw.  Representative states patient will need to reach out to [employer] directly at [phone] option #1 [phone] to schedule if patient wishes to proceed.   

## 2022-05-27 ENCOUNTER — Encounter: Payer: Self-pay | Admitting: Hematology and Oncology

## 2022-06-23 DIAGNOSIS — Z08 Encounter for follow-up examination after completed treatment for malignant neoplasm: Secondary | ICD-10-CM | POA: Diagnosis not present

## 2022-06-23 DIAGNOSIS — L814 Other melanin hyperpigmentation: Secondary | ICD-10-CM | POA: Diagnosis not present

## 2022-06-23 DIAGNOSIS — D225 Melanocytic nevi of trunk: Secondary | ICD-10-CM | POA: Diagnosis not present

## 2022-06-23 DIAGNOSIS — Z85828 Personal history of other malignant neoplasm of skin: Secondary | ICD-10-CM | POA: Diagnosis not present

## 2022-06-23 DIAGNOSIS — L821 Other seborrheic keratosis: Secondary | ICD-10-CM | POA: Diagnosis not present

## 2022-08-03 ENCOUNTER — Encounter: Payer: Self-pay | Admitting: *Deleted

## 2022-08-03 NOTE — Progress Notes (Signed)
Received message from [facility] stating pt does not wish to proceed with testing at this time.

## 2022-10-31 ENCOUNTER — Other Ambulatory Visit: Payer: Self-pay | Admitting: Hematology and Oncology

## 2022-11-01 ENCOUNTER — Encounter: Payer: Self-pay | Admitting: Oncology

## 2022-11-08 ENCOUNTER — Encounter: Payer: Self-pay | Admitting: Gastroenterology

## 2022-11-16 DIAGNOSIS — J302 Other seasonal allergic rhinitis: Secondary | ICD-10-CM | POA: Diagnosis not present

## 2022-11-16 DIAGNOSIS — J209 Acute bronchitis, unspecified: Secondary | ICD-10-CM | POA: Diagnosis not present

## 2022-11-16 DIAGNOSIS — R062 Wheezing: Secondary | ICD-10-CM | POA: Diagnosis not present

## 2022-11-16 DIAGNOSIS — R051 Acute cough: Secondary | ICD-10-CM | POA: Diagnosis not present

## 2022-11-16 DIAGNOSIS — J4520 Mild intermittent asthma, uncomplicated: Secondary | ICD-10-CM | POA: Diagnosis not present

## 2022-12-08 ENCOUNTER — Other Ambulatory Visit: Payer: Self-pay | Admitting: Hematology and Oncology

## 2022-12-09 DIAGNOSIS — L820 Inflamed seborrheic keratosis: Secondary | ICD-10-CM | POA: Diagnosis not present

## 2022-12-09 DIAGNOSIS — L538 Other specified erythematous conditions: Secondary | ICD-10-CM | POA: Diagnosis not present

## 2022-12-09 DIAGNOSIS — L298 Other pruritus: Secondary | ICD-10-CM | POA: Diagnosis not present

## 2022-12-15 ENCOUNTER — Ambulatory Visit (AMBULATORY_SURGERY_CENTER): Payer: 59

## 2022-12-15 VITALS — Ht 64.0 in | Wt 155.0 lb

## 2022-12-15 DIAGNOSIS — Z1211 Encounter for screening for malignant neoplasm of colon: Secondary | ICD-10-CM

## 2022-12-15 DIAGNOSIS — Z800 Family history of malignant neoplasm of digestive organs: Secondary | ICD-10-CM

## 2022-12-15 MED ORDER — NA SULFATE-K SULFATE-MG SULF 17.5-3.13-1.6 GM/177ML PO SOLN: 1.0000 | Freq: Once | ORAL | 0 refills | Status: AC

## 2022-12-15 NOTE — Progress Notes (Signed)
[facility] Pre-Visit Note  Pre visit Date 12/15/22     Tonya Whitehead    [id number]    01-10-1976  Primary Care Physician:Shaw, Netta Corrigan., MD  Referring Physician: Cleatis Polka., MD [address] [place],  [place] [address]  Pre visit completed via phone call  Direct   Last Colonoscopy: n/a  Last office visit: none  Procedure: Colonoscopy Date/Time of procedure: Friday 01/14/23 @ 9am Procedure MD: Lavon Paganini Procedure Diagnosis Code: z80.0 & z12.11  Blood thinners: Yes/No no  Bowel Prep: Suprep  Constipation: Yes/No sometimes  Insurance: [employer] education [employer] [facility]  Patient instructed to use Singlecare.com or GoodRx for a price reduction on prep  There were no vitals filed for this visit.  Body mass index is 26.61 kg/m.  Height: 5\' 4"  Weight: 155lb  Last Weight  Most recent update: 12/15/2022  4:11 PM    Weight  70.3 kg (155 lb)              No egg or soy allergy known to patient No issues known to patient with past sedation with any surgeries or procedures No history of difficulty with intubation No FH of Malignant Hyperthermia No weight loss medications or diet pills Patient is not on home 02 No history of A fib or A flutter  ECHO/ EF: 03/09/19  AICD/Pacemaker: no  Any cardiac testing pending: no  Allergies as of 12/15/2022 - Review Complete 01/16/2020  Allergen Reaction Noted   Gluten meal Hives 12/27/2018   Skin adhesives [cyanoacrylate] Hives 11/05/2019   Sulfa antibiotics  12/13/2018   Latex Hives 11/05/2019     MEDICATIONs Instructed to HOLD:none   Outpatient Encounter Medications as of 12/15/2022  Medication Sig   albuterol (VENTOLIN HFA) 108 (90 Base) MCG/ACT inhaler 2 puffs as needed Inhalation every 6 hrs for 30 days   cholecalciferol (VITAMIN D3) 25 MCG (1000 UNIT) tablet Take 1 tablet (1,000 Units total) by mouth daily.   letrozole (FEMARA) 2.5 MG tablet TAKE ONE TABLET BY MOUTH DAILY   levothyroxine  (SYNTHROID) 100 MCG tablet Take 100 mcg by mouth daily.   vitamin B-12 (CYANOCOBALAMIN) 500 MCG tablet Take 1 tablet (500 mcg total) by mouth daily.   Omega-3 Fatty Acids (FISH OIL) 500 MG CAPS Take by mouth daily.   pantoprazole (PROTONIX) 20 MG tablet TAKE 1 TABLET BY MOUTH DAILY (Patient not taking: Reported on 12/15/2022)   No facility-administered encounter medications on file as of 12/15/2022.     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   Family history of bladder cancer    Family history of colon cancer    Family history of lung cancer    Family history of ovarian cancer     Past Surgical History:  Procedure Laterality Date   ADENOIDECTOMY     as a child   BREAST EXCISIONAL BIOPSY Left 2002   reconstructive surgery on nipple to match right breast   IR IMAGING GUIDED PORT INSERTION  03/08/2019   IR REMOVAL TUN ACCESS W/ PORT W/O FL MOD SED  08/09/2019   ROBOTIC ASSISTED SALPINGO OOPHERECTOMY Bilateral 11/05/2019   Procedure: XI ROBOTIC ASSISTED SALPINGO OOPHORECTOMY;  Surgeon: Carver Fila, MD;  Location: [facility];  Service: Gynecology;  Laterality: Bilateral;    Family History  Problem Relation Age of Onset   Lung cancer Mother 66       smoker   Ovarian cancer Mother 26  Colon cancer Maternal Aunt        30s   Bladder Cancer Paternal Grandfather        death was unrelated   Cancer Paternal Aunt        through [facility], blood cancer     Fall Risk:     10/03/2019   10:41 AM 11/20/2019    1:56 PM 01/16/2020   10:31 AM 10/22/2021    8:00 AM 04/27/2022   10:00 AM  Fall Risk  (RETIRED) Patient Fall Risk Level Low fall risk Low fall risk Low fall risk Low fall risk Low fall risk     Patient's chart reviewed by Cathlyn Parsons CNRA prior to previsit and patient is appropriate to undergo procedure in [facility].  Previsit completed and red dot placed by patient's name on their procedure day (on provider's  schedule).      To be filled in admitting on day of procedure:  BP:               HR:                O2 Sat:               Temp:                       Blood glucose:  LMP:  Post menopause no  Pregnancy Test:  Blood Thinners: no  Last Dose:   Egg or soy allergy known to patient  No issues known to pt with past sedation with any surgeries or procedures Patient denies ever being told they had issues or difficulty with intubation  No FH of Malignant Hyperthermia Pt is not on diet pills Pt is not on  home 02  Pt is not on blood thinners  Pt denies issues with constipation  No A fib or A flutter Have any cardiac testing pending--no Pt instructed to use Singlecare.com or GoodRx for a price reduction on prep  Can ambulate without any assistance

## 2022-12-16 ENCOUNTER — Encounter: Payer: Self-pay | Admitting: Gastroenterology

## 2022-12-31 ENCOUNTER — Ambulatory Visit (INDEPENDENT_AMBULATORY_CARE_PROVIDER_SITE_OTHER): Payer: 59 | Admitting: Physician Assistant

## 2022-12-31 ENCOUNTER — Encounter: Payer: Self-pay | Admitting: Physician Assistant

## 2022-12-31 VITALS — BP 124/76 | HR 77 | Wt 158.0 lb

## 2022-12-31 DIAGNOSIS — F900 Attention-deficit hyperactivity disorder, predominantly inattentive type: Secondary | ICD-10-CM

## 2022-12-31 DIAGNOSIS — Z170 Estrogen receptor positive status [ER+]: Secondary | ICD-10-CM | POA: Diagnosis not present

## 2022-12-31 DIAGNOSIS — C50412 Malignant neoplasm of upper-outer quadrant of left female breast: Secondary | ICD-10-CM | POA: Diagnosis not present

## 2022-12-31 MED ORDER — AMPHETAMINE-DEXTROAMPHETAMINE 10 MG PO TABS: 5.0000 mg | ORAL_TABLET | Freq: Two times a day (BID) | ORAL | 0 refills | Status: DC

## 2022-12-31 MED ORDER — LISDEXAMFETAMINE DIMESYLATE 20 MG PO CAPS: 20.0000 mg | ORAL_CAPSULE | Freq: Every day | ORAL | 0 refills | Status: DC

## 2022-12-31 NOTE — Progress Notes (Unsigned)
[facility] MD/PA/NP Initial Note  12/31/2022 9:21 AM Tonya Whitehead  MRN:  [id number]  Chief Complaint:  Chief Complaint   Establish Care    HPI:  Thinks she may have ADD/ADHD.  For years has had numerous thoughts at a time, gets distracted easily. 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  Sleeps well.   Visit Diagnosis: No diagnosis found.  Past Psychiatric History:   Past medications for mental health diagnoses include: SSRI post partum for anxiety.   No mental health hospitalizations.   Past Medical History:  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   Family history of bladder cancer    Family history of colon cancer    Family history of lung cancer    Family history of ovarian cancer     Past Surgical History:  Procedure Laterality Date   ADENOIDECTOMY     as a child   BREAST EXCISIONAL BIOPSY Left 02/01/2001   reconstructive surgery on nipple to match right breast   IR IMAGING GUIDED PORT INSERTION  03/08/2019   IR REMOVAL TUN ACCESS W/ PORT W/O FL MOD SED  08/09/2019   MASTECTOMY Left    ROBOTIC ASSISTED SALPINGO OOPHERECTOMY Bilateral 11/05/2019   Procedure: XI ROBOTIC ASSISTED SALPINGO OOPHORECTOMY;  Surgeon: Carver Fila, MD;  Location: [facility];  Service: Gynecology;  Laterality: Bilateral;   THYROIDECTOMY Bilateral    2020    Family Psychiatric History:  See below  Family History:  Family History  Problem Relation Age of Onset   Lung cancer Mother 33       smoker   Ovarian cancer Mother 50       died in 2022-02-01   Healthy Father    Colon cancer Maternal Aunt        30s   Cancer Paternal Aunt        through [facility], blood cancer   Bladder Cancer Paternal Grandfather        death was unrelated   Healthy Paternal Grandmother     Healthy Half-Brother    Healthy Half-Sister    Anxiety disorder Daughter    ADD / ADHD Daughter    Healthy Son    Colon polyps Neg Hx    Esophageal cancer Neg Hx    Rectal cancer Neg Hx    Stomach cancer Neg Hx     Social History:  Social History   Socioeconomic History   Marital status: Married    Spouse name: Doug   Number of children: 3   Years of education: Not on file   Highest education level: Master's degree (e.g., MA, MS, MEng, MEd, MSW, MBA)  Occupational History   Not on file  Tobacco Use   Smoking status: Never   Smokeless tobacco: Never  Vaping Use   Vaping Use: Never used  Substance and Sexual Activity   Alcohol use: Not Currently    Alcohol/week: 0.0 - 1.0 standard drinks of alcohol    Comment: maybe 1 drink   Drug use: Never   Sexual activity: Yes    Birth control/protection: Other-see comments    Comment: husband had vasectomy  Other Topics Concern   Not on file  Social History Narrative   From [place] area. Parents divorced when she was 6. She has 2 older half-brothers  and 1 older half sister. No abuse.    Married 20 years, has 3 kids      Legal-none   Caffeine- 1 mug of coffee, occas coffee in afternoon   Religion-Christian          Social Determinants of Health   Financial Resource Strain: Not on file  Food Insecurity: Not on file  Transportation Needs: Not on file  Physical Activity: Not on file  Stress: Not on file  Social Connections: Not on file    Allergies:  Allergies  Allergen Reactions   Gluten Meal Hives    Joint pain, upset stomach   Skin Adhesives [Cyanoacrylate] Hives   Sulfa Antibiotics     Unknown reaction " I was told I had a reaction when I was a baby "   Latex Hives    Developed hives from elastic.    Metabolic Disorder Labs: No results found for: "HGBA1C", "MPG" No results found for: "PROLACTIN" No results found for: "CHOL", "TRIG", "HDL", "CHOLHDL", "VLDL", "LDLCALC" Lab Results  Component Value Date   TSH  0.363 (L) 04/16/2021    Therapeutic Level Labs: No results found for: "LITHIUM" No results found for: "VALPROATE" No results found for: "CBMZ"  Current Medications: Current Outpatient Medications  Medication Sig Dispense Refill   albuterol (VENTOLIN HFA) 108 (90 Base) MCG/ACT inhaler 2 puffs as needed Inhalation every 6 hrs for 30 days     amphetamine-dextroamphetamine (ADDERALL) 10 MG tablet Take 0.5-1 tablets (5-10 mg total) by mouth 2 (two) times daily before lunch and supper. prn 60 tablet 0   letrozole (FEMARA) 2.5 MG tablet TAKE ONE TABLET BY MOUTH DAILY 90 tablet 2   levothyroxine (SYNTHROID) 100 MCG tablet Take 100 mcg by mouth daily.     lisdexamfetamine (VYVANSE) 20 MG capsule Take 1 capsule (20 mg total) by mouth daily. 30 capsule 0   Magnesium 400 MG CAPS Take by mouth.     cholecalciferol (VITAMIN D3) 25 MCG (1000 UNIT) tablet Take 1 tablet (1,000 Units total) by mouth daily. (Patient not taking: Reported on 12/31/2022)     Omega-3 Fatty Acids (FISH OIL) 500 MG CAPS Take by mouth daily.  0   pantoprazole (PROTONIX) 20 MG tablet TAKE 1 TABLET BY MOUTH DAILY (Patient not taking: Reported on 12/15/2022) 30 tablet 0   vitamin B-12 (CYANOCOBALAMIN) 500 MCG tablet Take 1 tablet (500 mcg total) by mouth daily. (Patient not taking: Reported on 12/31/2022)     No current facility-administered medications for this visit.    Medication Side Effects: {Medication Side Effects (Optional):12147}  Orders placed this visit:  No orders of the defined types were placed in this encounter.   Psychiatric Specialty Exam:  Review of Systems  Blood pressure 124/76, pulse 77, weight 158 lb (71.7 kg).Body mass index is 27.12 kg/m.  General Appearance: {[phone]}  Eye Contact:  {PSY:22684}  Speech:  {[phone]}  Volume:  {PSY:22686}  Mood:  {PSY:22306}  Affect:  {[phone]}  Thought Process:  {PSY:22688}  Orientation:  {PSY:22689}  Thought Content: {PSY:22690}   Suicidal  Thoughts:  {PSY:22692}  Homicidal Thoughts:  {PSY:22692}  Memory:  {PSY:[phone]}  Judgement:  {PSY:22694}  Insight:  {PSY:22695}  Psychomotor Activity:  {PSY:22696}  Concentration:  {JXB:14782}  Recall:  {NFA:21308}  Fund of Knowledge: {PSY:22877}  Language: {MVH:84696}  Assets:  {EXB:28413}  ADL's:  {PSY:22290}  Cognition: {KGM:[id number]}  Prognosis:  {PSY:22877}   Screenings:   Receiving Psychotherapy: {UYQ:03474}  Treatment Plan/Recommendations:  PDMP    Melony Overly, PA-C

## 2023-01-10 ENCOUNTER — Telehealth: Payer: Self-pay | Admitting: Gastroenterology

## 2023-01-10 ENCOUNTER — Encounter: Payer: Self-pay | Admitting: Gastroenterology

## 2023-01-10 NOTE — Addendum Note (Signed)
Addended by: Jaquelyn Bitter on: 01/10/2023 01:02 PM   Modules accepted: Orders

## 2023-01-10 NOTE — Telephone Encounter (Signed)
Tried to reach patient, called 3 times. Left message for patient regarding DX code change.  Family hx of colon cx was Maternal Aunt; not a 1st degree relative, the dx was changed to screening.

## 2023-01-10 NOTE — Telephone Encounter (Signed)
Inbound call from patient regarding recent mychart messages. States  she has colon cancer history on her Mother's side, with her Aunt. States she would like to have a screening colonoscopy. Please advise, thank you.

## 2023-01-10 NOTE — Telephone Encounter (Deleted)
Inbound call from patient returning phone call. Please advise, thank you.  

## 2023-01-10 NOTE — Telephone Encounter (Signed)
Error

## 2023-01-14 ENCOUNTER — Ambulatory Visit (AMBULATORY_SURGERY_CENTER): Payer: 59 | Admitting: Gastroenterology

## 2023-01-14 ENCOUNTER — Encounter: Payer: Self-pay | Admitting: Gastroenterology

## 2023-01-14 VITALS — BP 131/67 | HR 61 | Temp 97.8°F | Resp 12 | Ht 64.0 in | Wt 155.0 lb

## 2023-01-14 DIAGNOSIS — Z1211 Encounter for screening for malignant neoplasm of colon: Secondary | ICD-10-CM | POA: Diagnosis not present

## 2023-01-14 DIAGNOSIS — D122 Benign neoplasm of ascending colon: Secondary | ICD-10-CM

## 2023-01-14 MED ORDER — SODIUM CHLORIDE 0.9 % IV SOLN
500.0000 mL | Freq: Once | INTRAVENOUS | Status: DC
Start: 2023-01-14 — End: 2023-01-14

## 2023-01-14 NOTE — Progress Notes (Signed)
[facility] History and Physical   Primary Care Physician:  Cleatis Polka., MD   Reason for Procedure:  Colorectal cancer screening  Plan:    Screening colonoscopy with possible interventions as needed     HPI: Tonya Whitehead is a very pleasant 47 y.o. female here for screening colonoscopy. Denies any nausea, vomiting, abdominal pain, melena or bright red blood per rectum  The risks and benefits as well as alternatives of endoscopic procedure(s) have been discussed and reviewed. All questions answered. The patient agrees to proceed.    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   Family history of bladder cancer    Family history of colon cancer    Family history of lung cancer    Family history of ovarian cancer     Past Surgical History:  Procedure Laterality Date   ADENOIDECTOMY     as a child   BREAST EXCISIONAL BIOPSY Left 2002   reconstructive surgery on nipple to match right breast   IR IMAGING GUIDED PORT INSERTION  03/08/2019   IR REMOVAL TUN ACCESS W/ PORT W/O FL MOD SED  08/09/2019   MASTECTOMY Left    ROBOTIC ASSISTED SALPINGO OOPHERECTOMY Bilateral 11/05/2019   Procedure: XI ROBOTIC ASSISTED SALPINGO OOPHORECTOMY;  Surgeon: Carver Fila, MD;  Location: [facility];  Service: Gynecology;  Laterality: Bilateral;   THYROIDECTOMY Bilateral    2020    Prior to Admission medications   Medication Sig Start Date End Date Taking? Authorizing Provider  letrozole [facility]) 2.5 MG tablet TAKE ONE TABLET BY MOUTH DAILY 11/01/22  Yes Serena Croissant, MD  levothyroxine (SYNTHROID) 100 MCG tablet Take 100 mcg by mouth daily.   Yes [provider]  lisdexamfetamine (VYVANSE) 20 MG capsule Take 1 capsule (20 mg total) by mouth daily. 12/31/22  Yes Cherie Ouch, PA-C  Magnesium 400 MG CAPS Take by mouth.   Yes [provider]  albuterol (VENTOLIN HFA) 108 (90 Base)  MCG/ACT inhaler 2 puffs as needed Inhalation every 6 hrs for 30 days 11/16/22   [provider]  amphetamine-dextroamphetamine (ADDERALL) 10 MG tablet Take 0.5-1 tablets (5-10 mg total) by mouth 2 (two) times daily before lunch and supper. prn 12/31/22   Melony Overly T, PA-C  Omega-3 Fatty Acids (FISH OIL) 500 MG CAPS Take by mouth daily. 04/14/20   Magrinat, Valentino Hue, MD    Current Outpatient Medications  Medication Sig Dispense Refill   letrozole (FEMARA) 2.5 MG tablet TAKE ONE TABLET BY MOUTH DAILY 90 tablet 2   levothyroxine (SYNTHROID) 100 MCG tablet Take 100 mcg by mouth daily.     lisdexamfetamine (VYVANSE) 20 MG capsule Take 1 capsule (20 mg total) by mouth daily. 30 capsule 0   Magnesium 400 MG CAPS Take by mouth.     albuterol (VENTOLIN HFA) 108 (90 Base) MCG/ACT inhaler 2 puffs as needed Inhalation every 6 hrs for 30 days     amphetamine-dextroamphetamine (ADDERALL) 10 MG tablet Take 0.5-1 tablets (5-10 mg total) by mouth 2 (two) times daily before lunch and supper. prn 60 tablet 0   Omega-3 Fatty Acids (FISH OIL) 500 MG CAPS Take by mouth daily.  0   Current Facility-Administered Medications  Medication Dose Route Frequency Provider Last Rate Last Admin   0.9 %  sodium chloride infusion  500 mL Intravenous Once Napoleon Form, MD        Allergies as of 01/14/2023 -  Review Complete 01/14/2023  Allergen Reaction Noted   Gluten meal Hives 12/27/2018   Skin adhesives [cyanoacrylate] Hives 11/05/2019   Sulfa antibiotics  12/13/2018   Latex Hives 11/05/2019    Family History  Problem Relation Age of Onset   Lung cancer Mother 63       smoker   Ovarian cancer Mother 77       died in 01/29/2022   Healthy Father    Colon cancer Maternal Aunt        30s   Cancer Paternal Aunt        through PGM, blood cancer   Bladder Cancer Paternal Grandfather        death was unrelated   Healthy Paternal Grandmother    Healthy Half-Brother    Healthy Half-Sister    Anxiety  disorder Daughter    ADD / ADHD Daughter    Healthy Son    Colon polyps Neg Hx    Esophageal cancer Neg Hx    Rectal cancer Neg Hx    Stomach cancer Neg Hx     Social History   Socioeconomic History   Marital status: Married    Spouse name: Doug   Number of children: 3   Years of education: Not on file   Highest education level: Master's degree (e.g., MA, MS, MEng, MEd, MSW, MBA)  Occupational History   Not on file  Tobacco Use   Smoking status: Never   Smokeless tobacco: Never  Vaping Use   Vaping Use: Never used  Substance and Sexual Activity   Alcohol use: Not Currently    Alcohol/week: 0.0 - 1.0 standard drinks of alcohol    Comment: maybe 1 drink   Drug use: Never   Sexual activity: Yes    Birth control/protection: Other-see comments    Comment: husband had vasectomy  Other Topics Concern   Not on file  Social History Narrative   From [place] area. Parents divorced when she was 6. She has 2 older half-brothers and 1 older half sister. No abuse.    Married 20 years, has 3 kids      Legal-none   Caffeine- 1 mug of coffee, occas coffee in afternoon   Religion-Christian          Social Determinants of Health   Financial Resource Strain: Not on file  Food Insecurity: Not on file  Transportation Needs: Not on file  Physical Activity: Not on file  Stress: Not on file  Social Connections: Not on file  Intimate Partner Violence: Not on file    Review of Systems:  All other review of systems negative except as mentioned in the HPI.  Physical Exam: Vital signs in last 24 hours: Blood Pressure 110/74   Pulse 85   Temperature 97.8 F (36.6 C)   Height 5\' 4"  (1.626 m)   Weight 155 lb (70.3 kg)   Oxygen Saturation 100%   Body Mass Index 26.61 kg/m  General:   Alert, NAD Lungs:  Clear .   Heart:  Regular rate and rhythm Abdomen:  Soft, nontender and nondistended. Neuro/Psych:  Alert and cooperative. Normal mood and affect. A and O x 3  Reviewed labs,  radiology imaging, old records and pertinent past GI work up  Patient is appropriate for planned procedure(s) and anesthesia in an ambulatory setting   K. Scherry Ran , MD [phone]

## 2023-01-14 NOTE — Patient Instructions (Signed)
Resume previous diet and medications. Awaiting pathology results. Repeat Colonoscopy date to be determined based on pathology results. Handouts provided on Colon polyps, Diverticulosis and Hemorrhoids   YOU HAD AN ENDOSCOPIC PROCEDURE TODAY AT THE [facility]:   Refer to the procedure report that was given to you for any specific questions about what was found during the examination.  If the procedure report does not answer your questions, please call your gastroenterologist to clarify.  If you requested that your care partner not be given the details of your procedure findings, then the procedure report has been included in a sealed envelope for you to review at your convenience later.  YOU SHOULD EXPECT: Some feelings of bloating in the abdomen. Passage of more gas than usual.  Walking can help get rid of the air that was put into your GI tract during the procedure and reduce the bloating. If you had a lower endoscopy (such as a colonoscopy or flexible sigmoidoscopy) you may notice spotting of blood in your stool or on the toilet paper. If you underwent a bowel prep for your procedure, you may not have a normal bowel movement for a few days.  Please Note:  You might notice some irritation and congestion in your nose or some drainage.  This is from the oxygen used during your procedure.  There is no need for concern and it should clear up in a day or so.  SYMPTOMS TO REPORT IMMEDIATELY:  Following lower endoscopy (colonoscopy or flexible sigmoidoscopy):  Excessive amounts of blood in the stool  Significant tenderness or worsening of abdominal pains  Swelling of the abdomen that is new, acute  Fever of 100F or higher  For urgent or emergent issues, a gastroenterologist can be reached at any hour by calling [phone]. Do not use MyChart messaging for urgent concerns.    DIET:  We do recommend a small meal at first, but then you may proceed to your regular diet.  Drink plenty of  fluids but you should avoid alcoholic beverages for 24 hours.  ACTIVITY:  You should plan to take it easy for the rest of today and you should NOT DRIVE or use heavy machinery until tomorrow (because of the sedation medicines used during the test).    FOLLOW UP: Our staff will call the number listed on your records the next business day following your procedure.  We will call around 7:15- 8:00 am to check on you and address any questions or concerns that you may have regarding the information given to you following your procedure. If we do not reach you, we will leave a message.     If any biopsies were taken you will be contacted by phone or by letter within the next 1-3 weeks.  Please call us at [phone] if you have not heard about the biopsies in 3 weeks.    SIGNATURES/CONFIDENTIALITY: You and/or your care partner have signed paperwork which will be entered into your electronic medical record.  These signatures attest to the fact that that the information above on your After Visit Summary has been reviewed and is understood.  Full responsibility of the confidentiality of this discharge information lies with you and/or your care-partner. 

## 2023-01-14 NOTE — Progress Notes (Signed)
Called to room to assist during endoscopic procedure.  Patient ID and intended procedure confirmed with present staff. Received instructions for my participation in the procedure from the performing physician.  

## 2023-01-14 NOTE — Progress Notes (Signed)
To pacu, VSS. Report to [email or web address] 

## 2023-01-14 NOTE — Op Note (Signed)
[facility] Patient Name: Tonya Whitehead Procedure Date: 01/14/2023 9:25 AM MRN: [id number] Endoscopist: Napoleon Form , MD, [id number] Age: 47 Referring MD:  Date of Birth: 02/12/1976 Gender: Female Account #: [id number] Procedure:                Colonoscopy Indications:              Screening for colorectal malignant neoplasm Medicines:                Monitored Anesthesia Care Procedure:                Pre-Anesthesia Assessment:                           - Prior to the procedure, a History and Physical                            was performed, and patient medications and                            allergies were reviewed. The patient's tolerance of                            previous anesthesia was also reviewed. The risks                            and benefits of the procedure and the sedation                            options and risks were discussed with the patient.                            All questions were answered, and informed consent                            was obtained. Prior Anticoagulants: The patient has                            taken no anticoagulant or antiplatelet agents. ASA                            Grade Assessment: II - A patient with mild systemic                            disease. After reviewing the risks and benefits,                            the patient was deemed in satisfactory condition to                            undergo the procedure.                           After obtaining informed consent, the colonoscope  was passed under direct vision. Throughout the                            procedure, the patient's blood pressure, pulse, and                            oxygen saturations were monitored continuously. The                            Olympus PCF-H190DL [id number]) Colonoscope was                            introduced through the anus and advanced to the the                            cecum,  identified by appendiceal orifice and                            ileocecal valve. The colonoscopy was performed                            without difficulty. The patient tolerated the                            procedure well. The quality of the bowel                            preparation was good. The ileocecal valve,                            appendiceal orifice, and rectum were photographed. Scope In: 9:33:38 AM Scope Out: 9:47:34 AM Scope Withdrawal Time: 0 hours 8 minutes 22 seconds  Total Procedure Duration: 0 hours 13 minutes 56 seconds  Findings:                 The perianal and digital rectal examinations were                            normal.                           A 7 mm polyp was found in the ascending colon. The                            polyp was sessile. The polyp was removed with a                            cold snare. Resection and retrieval were complete.                           A few small-mouthed diverticula were found in the                            sigmoid colon.  Non-bleeding internal hemorrhoids were found during                            retroflexion. The hemorrhoids were small. Complications:            No immediate complications. Estimated Blood Loss:     Estimated blood loss was minimal. Impression:               - One 7 mm polyp in the ascending colon, removed                            with a cold snare. Resected and retrieved.                           - Diverticulosis in the sigmoid colon.                           - Non-bleeding internal hemorrhoids. Recommendation:           - Patient has a contact number available for                            emergencies. The signs and symptoms of potential                            delayed complications were discussed with the                            patient. Return to normal activities tomorrow.                            Written discharge instructions were provided to the                             patient.                           - Resume previous diet.                           - Continue present medications.                           - Await pathology results.                           - Repeat colonoscopy in 5-10 years for surveillance                            based on pathology results. Napoleon Form, MD 01/14/2023 9:53:03 AM This report has been signed electronically.

## 2023-01-18 ENCOUNTER — Telehealth: Payer: Self-pay | Admitting: *Deleted

## 2023-01-18 NOTE — Telephone Encounter (Signed)
  Follow up Call-     01/14/2023    8:10 AM  Call back number  Post procedure Call Back phone  # [phone]  Permission to leave phone message Yes     Patient questions:   Message left to call us if  necessary.

## 2023-01-31 ENCOUNTER — Encounter: Payer: Self-pay | Admitting: Gastroenterology

## 2023-02-03 ENCOUNTER — Encounter: Payer: Self-pay | Admitting: Physician Assistant

## 2023-02-03 ENCOUNTER — Ambulatory Visit: Payer: 59 | Admitting: Physician Assistant

## 2023-02-03 VITALS — BP 113/81 | HR 83

## 2023-02-03 DIAGNOSIS — F40243 Fear of flying: Secondary | ICD-10-CM | POA: Diagnosis not present

## 2023-02-03 DIAGNOSIS — F900 Attention-deficit hyperactivity disorder, predominantly inattentive type: Secondary | ICD-10-CM

## 2023-02-03 MED ORDER — VYVANSE 20 MG PO CAPS: 20.0000 mg | ORAL_CAPSULE | Freq: Every day | ORAL | 0 refills | Status: DC

## 2023-02-03 MED ORDER — ALPRAZOLAM 0.5 MG PO TABS: 0.2500 mg | ORAL_TABLET | Freq: Every evening | ORAL | 0 refills | Status: DC | PRN

## 2023-02-03 NOTE — Progress Notes (Signed)
[facility]  Patient ID: Tonya Whitehead,  MRN: [id number]  PCP: Cleatis Polka., MD  Date of Evaluation: 02/06/2023 Time spent: 22 mins  Chief Complaint:  Chief Complaint   ADHD; Follow-up    HISTORY/CURRENT STATUS: HPI  for 1 month med check.  Vyvanse is working very well. Seems like the dose is just right. No SE from it.  States that attention is good without easy distractibility.  Able to focus on things and finish tasks to completion.   Patient is able to enjoy things.  Energy and motivation are good.  Work is going well.   No extreme sadness, tearfulness, or feelings of hopelessness.  Sleeps well.  ADLs and personal hygiene are normal.  Appetite has not changed.  Weight is stable.   Denies suicidal or homicidal thoughts.  Going to [place] in a few months. Doesn't like to fly, but especially wants to be able to sleep on the plane.   Denies dizziness, syncope, seizures, numbness, tingling, tremor, tics, unsteady gait, slurred speech, confusion. Denies muscle or joint pain, stiffness, or dystonia.  Individual Medical History/ Review of Systems: Changes? :No   Past medications for mental health diagnoses include: SSRI post partum for anxiety.    No mental health hospitalizations.   Allergies: Gluten meal, Skin adhesives [cyanoacrylate], Sulfa antibiotics, and Latex  Current Medications:  Current Outpatient Medications:    ALPRAZolam (XANAX) 0.5 MG tablet, Take 0.5-1 tablets (0.25-0.5 mg total) by mouth at bedtime as needed for anxiety. For flying, Disp: 5 tablet, Rfl: 0   letrozole (FEMARA) 2.5 MG tablet, TAKE ONE TABLET BY MOUTH DAILY, Disp: 90 tablet, Rfl: 2   levothyroxine (SYNTHROID) 100 MCG tablet, Take 100 mcg by mouth daily., Disp: , Rfl:    Magnesium 400 MG CAPS, Take by mouth., Disp: , Rfl:    [START ON 03/05/2023] VYVANSE 20 MG capsule, Take 1 capsule (20 mg total) by mouth daily., Disp: 30 capsule, Rfl: 0   VYVANSE 20 MG capsule, Take 1 capsule  (20 mg total) by mouth daily., Disp: 30 capsule, Rfl: 0   albuterol (VENTOLIN HFA) 108 (90 Base) MCG/ACT inhaler, 2 puffs as needed Inhalation every 6 hrs for 30 days (Patient not taking: Reported on 02/03/2023), Disp: , Rfl:    amphetamine-dextroamphetamine (ADDERALL) 10 MG tablet, Take 0.5-1 tablets (5-10 mg total) by mouth 2 (two) times daily before lunch and supper. prn (Patient not taking: Reported on 02/03/2023), Disp: 60 tablet, Rfl: 0   [START ON 03/28/2023] VYVANSE 20 MG capsule, Take 1 capsule (20 mg total) by mouth daily., Disp: 30 capsule, Rfl: 0 Medication Side Effects: none  Family Medical/ Social History: Changes? No  MENTAL HEALTH EXAM:  Blood pressure 113/81, pulse 83.There is no height or weight on file to calculate BMI.  General Appearance: Casual and Well Groomed  Eye Contact:  Good  Speech:  Clear and Coherent and Normal Rate  Volume:  Normal  Mood:  Euthymic  Affect:  Congruent  Thought Process:  Goal Directed and Descriptions of Associations: Circumstantial  Orientation:  Full (Time, Place, and Person)  Thought Content: Logical   Suicidal Thoughts:  No  Homicidal Thoughts:  No  Memory:  WNL  Judgement:  Good  Insight:  Good  Psychomotor Activity:  Normal  Concentration:  Concentration: Good and Attention Span: Good  Recall:  Good  Fund of Knowledge: Good  Language: Good  Assets:  Communication Skills Desire for Improvement Financial Resources/Insurance Housing Transportation Vocational/Educational  ADL's:  Intact  Cognition: WNL  Prognosis:  Good   DIAGNOSES:    ICD-10-CM   1. Attention deficit hyperactivity disorder (ADHD), predominantly inattentive type  F90.0     2. 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  Vyvanse filled 12/31/2022.  Adderall filled 12/31/2022. I provided 22 minutes of face to face time during this encounter, including time spent before and after the visit in records review,  medical decision making, counseling pertinent to today's visit, and charting.   I'm glad she's doing well.  No changes.  Will give a few Xanax for fear of flying.   Start Xanax 0.5 mg, 1/2-1 every day prn flying.  Continue Adderall 10 mg, 1/2-1 in afternoon and early evening prn (she hasn't taken it at all.) Continue Vyvanse 20 mg, 1 q am. Return in 3 months.  Melony Overly, PA-C

## 2023-02-15 DIAGNOSIS — Z170 Estrogen receptor positive status [ER+]: Secondary | ICD-10-CM | POA: Diagnosis not present

## 2023-02-15 DIAGNOSIS — Z9012 Acquired absence of left breast and nipple: Secondary | ICD-10-CM | POA: Diagnosis not present

## 2023-02-15 DIAGNOSIS — C50412 Malignant neoplasm of upper-outer quadrant of left female breast: Secondary | ICD-10-CM | POA: Diagnosis not present

## 2023-02-15 DIAGNOSIS — Z01818 Encounter for other preprocedural examination: Secondary | ICD-10-CM | POA: Diagnosis not present

## 2023-03-09 ENCOUNTER — Encounter: Payer: 59 | Admitting: Internal Medicine

## 2023-03-29 NOTE — Progress Notes (Signed)
Office Visit Note  Patient: Tonya Whitehead             Date of Birth: 20-Jul-1976           MRN: [id number]             PCP: Cleatis Polka., MD Referring: Cleatis Polka., MD Visit Date: 04/12/2023 Occupation: @GUAROCC @  Subjective:  Pain in multiple joints  History of Present Illness: Tonya Whitehead is a 47 y.o. female seen in consultation per request of her PCP.  According the patient her symptoms started with joint pain at age 41.  She states the pain increased with walking and doing activities at times.  She states she started noticing increased discomfort after postpartum.  Her pain would get worse with the weather change.  She states the pain usually moves around.  In 2013 she started experiencing lower back pain to the point her back will lock up.  She states she also started experiencing generalized hypersensitivity to touch.  She tried modifying her diet and went on gluten-free and sugar-free diet and she feels that her symptoms improved over the next 5 years.  In 2020 she was diagnosed with breast cancer she underwent left mastectomy, chemotherapy and radiation therapy.  She states after chemotherapy she started having paresthesias in her left lower extremity.  She states the overall soreness improved but she continues to have neck is stiffness, lower back pain, SI joint discomfort.  She feels there is hypermobility in her hips and she performs yoga.  She also experiences discomfort in her trochanteric region, left knee and right ankle joint off-and-on.  She has not noticed any joint swelling.  There is no history of fatigue,Oral ulcers, nasal ulcers, malar rash, Raynaud's phenomenon, lymphadenopathy.  She gives history of photosensitivity and dry mouth.  There is family history of rheumatoid arthritis and paternal aunt and another family on the paternal side.  She is gravida 3, para 3 with no history of miscarriages.  There is no history of preeclampsia or DVTs.  She is a  Airline pilot.  She enjoys gardening, reading, hiking and traveling.  She walks for exercise and also practices yoga.  Her husband is a physical therapist.    Activities of Daily Living:  Patient reports morning stiffness for 40 minutes.   Patient Reports nocturnal pain.  Difficulty dressing/grooming: Reports Difficulty climbing stairs: Reports Difficulty getting out of chair: Reports Difficulty using hands for taps, buttons, cutlery, and/or writing: Denies  Review of Systems  Constitutional:  Negative for fatigue.  HENT:  Positive for mouth dryness. Negative for mouth sores.   Eyes:  Negative for dryness.  Respiratory:  Negative for shortness of breath.   Cardiovascular:  Negative for chest pain and palpitations.  Gastrointestinal:  Positive for constipation. Negative for blood in stool and diarrhea.  Endocrine: Negative for increased urination.  Genitourinary:  Negative for involuntary urination.  Musculoskeletal:  Positive for joint pain, joint pain and morning stiffness. Negative for gait problem, joint swelling, myalgias, muscle weakness, muscle tenderness and myalgias.  Skin:  Positive for sensitivity to sunlight. Negative for color change, rash and hair loss.  Allergic/Immunologic: Negative for susceptible to infections.  Neurological:  Negative for dizziness and headaches.  Hematological:  Negative for swollen glands.  Psychiatric/Behavioral:  Positive for sleep disturbance. Negative for depressed mood. The patient is not nervous/anxious.     PMFS History:  Patient Active Problem List   Diagnosis Date Noted   S/P left mastectomy 01/30/2019  Genetic testing 01/10/2019   Family history of ovarian cancer    Family history of colon cancer    Family history of lung cancer    Family history of bladder cancer    Malignant neoplasm of upper-outer quadrant of left breast in female, estrogen receptor positive (HCC) 12/25/2018   Malignant neoplasm of overlapping sites of left  breast in female, estrogen receptor positive (HCC) 12/13/2018    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   Family history of bladder cancer    Family history of colon cancer    Family history of lung cancer    Family history of ovarian cancer     Family History  Problem Relation Age of Onset   Lung cancer Mother 75       smoker   Ovarian cancer Mother 4       died in 06-13-22   Healthy Father    Diabetes Father    Heart disease Father    Colon cancer Maternal Aunt        30s   Rheum arthritis Paternal Aunt    Autoimmune disease Paternal Aunt    Cancer Paternal Aunt        through PGM, blood cancer   Healthy Paternal Grandmother    Bladder Cancer Paternal Grandfather        death was unrelated   Healthy Daughter    Anxiety disorder Daughter    Healthy Daughter    ADD / ADHD Daughter    Healthy Son    Healthy Half-Brother    Healthy Half-Brother    Healthy Half-Sister    Colon polyps Neg Hx    Esophageal cancer Neg Hx    Rectal cancer Neg Hx    Stomach cancer Neg Hx    Past Surgical History:  Procedure Laterality Date   ADENOIDECTOMY     as a child   BREAST EXCISIONAL BIOPSY Left June 13, 2001   reconstructive surgery on nipple to match right breast   IR IMAGING GUIDED PORT INSERTION  03/08/2019   IR REMOVAL TUN ACCESS W/ PORT W/O FL MOD SED  08/09/2019   MASTECTOMY Left    ROBOTIC ASSISTED SALPINGO OOPHERECTOMY Bilateral 11/05/2019   Procedure: XI ROBOTIC ASSISTED SALPINGO OOPHORECTOMY;  Surgeon: Carver Fila, MD;  Location: [facility] [place];  Service: Gynecology;  Laterality: Bilateral;   THYROIDECTOMY Bilateral    2020   Social History   Social History Narrative   From [place] area. Parents divorced when she was 6. She has 2 older half-brothers and 1 older half sister. No abuse.    Married 20 years, has 3 kids      Legal-none   Caffeine- 1 mug of coffee, occas coffee in afternoon   Religion-Christian           Immunization History  Administered Date(s) Administered   Influenza,inj,Quad PF,6+ Mos 06/13/2019   PFIZER(Purple Top)SARS-COV-2 Vaccination 09/06/2019, 09/29/2019     Objective: Vital Signs: BP 114/85 (BP Location: Right Arm, Patient Position: Sitting, Cuff Size: Normal)   Pulse 87   Resp 15   Ht 5\' 4"  (1.626 m)   Wt 150 lb 9.6 oz (68.3 kg)   BMI 25.85 kg/m    Physical Exam Vitals and nursing note reviewed.  Constitutional:      Appearance: She is well-developed.  HENT:     Head: Normocephalic and atraumatic.  Eyes:     Conjunctiva/sclera: Conjunctivae normal.  Cardiovascular:  Rate and Rhythm: Normal rate and regular rhythm.     Heart sounds: Normal heart sounds.  Pulmonary:     Effort: Pulmonary effort is normal.     Breath sounds: Normal breath sounds.  Abdominal:     General: Bowel sounds are normal.     Palpations: Abdomen is soft.  Musculoskeletal:     Cervical back: Normal range of motion.  Lymphadenopathy:     Cervical: No cervical adenopathy.  Skin:    General: Skin is warm and dry.     Capillary Refill: Capillary refill takes less than 2 seconds.  Neurological:     Mental Status: She is alert and oriented to person, place, and time.  Psychiatric:        Behavior: Behavior normal.      Musculoskeletal Exam: Cervical spine was in good range of motion.  She had good mobility in her lumbar spine with discomfort.  She had mild tenderness over SI joints and gluteal region.  She had bilateral trapezius spasm.  Shoulder joints, elbow joints, wrist joints, MCPs PIPs and DIPs were in good range of motion.  She had mild DIP thickening bilaterally.  Hip joints and knee joints were in good range of motion without any warmth swelling or effusion.  She had tenderness over bilateral trochanteric region.  There was no tenderness over ankles or MTPs.  She described mild tenderness over the right fifth metatarsal base.  No synovitis was noted.  CDAI Exam: CDAI  Score: -- Patient Global: --; Provider Global: -- Swollen: --; Tender: -- Joint Exam 04/12/2023   No joint exam has been documented for this visit   There is currently no information documented on the homunculus. Go to the Rheumatology activity and complete the homunculus joint exam.  Investigation: No additional findings.  Imaging: No results found.  Recent Labs: Lab Results  Component Value Date   WBC 6.3 04/27/2022   HGB 13.5 04/27/2022   PLT 249 04/27/2022   NA 139 04/27/2022   K 4.0 04/27/2022   CL 104 04/27/2022   CO2 31 04/27/2022   GLUCOSE 107 (H) 04/27/2022   BUN 9 04/27/2022   CREATININE 0.69 04/27/2022   BILITOT 0.4 04/27/2022   ALKPHOS 89 04/27/2022   AST 22 04/27/2022   ALT 33 04/27/2022   PROT 6.9 04/27/2022   ALBUMIN 4.5 04/27/2022   CALCIUM 9.8 04/27/2022   GFRAA >60 04/14/2020    Speciality Comments: No specialty comments available.  Procedures:  No procedures performed Allergies: Gluten meal, Other, Skin adhesives [cyanoacrylate], Sulfa antibiotics, and Latex   Assessment / Plan:     Visit Diagnoses: Polyarthralgia-patient gives history of migratory arthralgias for the last  several years.  She states her symptoms started when she was 47 years old and progressively got worse.  Her symptoms were worse between 2013 and 2018 to the point she was having hyperalgesia.  Her symptoms improved when she was on gluten-free and sugar-free diet.  She continues to have pain mostly in her lower back, SI joints, left knee, right foot.  There is no history of Achilles tendinitis or plantar fasciitis.  There is no history of psoriasis.  Rheumatoid factor, Cyclic citrul peptide antibody, IgG  Positive ANA (antinuclear antibody) -patient states she has had positive ANA for the last several years but did not develop any symptoms of lupus.  There is no history of oral ulcers, nasal ulcers, malar rash, Raynaud's phenomenon or lymphadenopathy.  There is no history of  inflammatory arthritis.  She gives history of dry mouth and photosensitivity.  I will obtain autoimmune labs today.- Plan: CBC with Differential/Platelet, COMPLETE METABOLIC PANEL WITH GFR, Protein / creatinine ratio, urine, Sedimentation rate, ANA, Anti-scleroderma antibody, RNP Antibody, Anti-Smith antibody, Sjogrens syndrome-A extractable nuclear antibody, Sjogrens syndrome-B extractable nuclear antibody, Anti-DNA antibody, double-stranded, C3 and C4  Chronic midline low back pain without sciatica -she has been experiencing lower back pain since 2013.  She states the pain used to be very severe and eased off with time.  But she still has discomfort on a daily basis and problems with mobility.  She had painful range of motion of her lumbar spine.  She reports was negative.  She had tenderness in the lower lumbar region and over her SI joints.  Plan: XR Lumbar Spine 2-3 Views, levoscoliosis and facet joint arthropathy was noted.  X-ray results were reviewed with the patient.  I offered physical therapy but patient declined.  Patient stated that her husband is a physical therapist and will work with her.  I gave her a handout on back exercises.  Chronic SI joint pain -she continues to have discomfort over SI joints intermittently.  Plan: XR Pelvis 1-2 Views.  No SI joint sclerosis or narrowing was noted.  No hip joint narrowing was noted.  No chondrocalcinosis was noted.  X-ray findings were reviewed with the patient.  Myalgia -she gives history of generalized achiness and hyperalgesia.  Her symptoms were worse between 2013 and 2018.  She practices yoga on a regular basis and does a stretching exercises.  The possibility of myofascial pain syndrome was discussed.  She has been practicing yoga and walking which is helpful.  Need for regular exercise was emphasized.  Benefits of water aerobics and summing were also discussed.  Plan: CK  S/P left mastectomy  Malignant neoplasm of upper-outer quadrant of left  breast in female, estrogen receptor positive (HCC) -she was diagnosed with left breast cancer in 2020, status post left mastectomy, chemotherapy, radiation therapy.  On letrozole.  Status post bilateral oophorectomy  History of hyperlipidemia-currently not taking any medications.  Postprocedural hypothyroidism - Thyroidectomy for goiter 2021.  Empty sella turcica (HCC)  History of gluten intolerance  B12 deficiency  Family history of rheumatoid arthritis-paternal aunt  Family history of ovarian cancer-mother  Family history of lung cancer-mother  Family history of colon cancer-maternal side  Family history of bladder cancer-paternal grandfather  Orders: Orders Placed This Encounter  Procedures   XR Lumbar Spine 2-3 Views   XR Pelvis 1-2 Views   CBC with Differential/Platelet   COMPLETE METABOLIC PANEL WITH GFR   Protein / creatinine ratio, urine   Sedimentation rate   CK   Rheumatoid factor   Cyclic citrul peptide antibody, IgG   ANA   Anti-scleroderma antibody   RNP Antibody   Anti-Smith antibody   Sjogrens syndrome-A extractable nuclear antibody   Sjogrens syndrome-B extractable nuclear antibody   Anti-DNA antibody, double-stranded   C3 and C4   No orders of the defined types were placed in this encounter.    Follow-Up Instructions: Return for +ANA,LBP.   Pollyann Savoy, MD  Note - This record has been created using Animal nutritionist.  Chart creation errors have been sought, but may not always  have been located. Such creation errors do not reflect on  the standard of medical care.

## 2023-04-12 ENCOUNTER — Ambulatory Visit (INDEPENDENT_AMBULATORY_CARE_PROVIDER_SITE_OTHER): Payer: 59

## 2023-04-12 ENCOUNTER — Ambulatory Visit: Payer: 59 | Attending: Internal Medicine | Admitting: Rheumatology

## 2023-04-12 ENCOUNTER — Encounter: Payer: Self-pay | Admitting: Rheumatology

## 2023-04-12 VITALS — BP 114/85 | HR 87 | Resp 15 | Ht 64.0 in | Wt 150.6 lb

## 2023-04-12 DIAGNOSIS — G8929 Other chronic pain: Secondary | ICD-10-CM

## 2023-04-12 DIAGNOSIS — C50412 Malignant neoplasm of upper-outer quadrant of left female breast: Secondary | ICD-10-CM | POA: Diagnosis not present

## 2023-04-12 DIAGNOSIS — E236 Other disorders of pituitary gland: Secondary | ICD-10-CM | POA: Diagnosis not present

## 2023-04-12 DIAGNOSIS — E538 Deficiency of other specified B group vitamins: Secondary | ICD-10-CM

## 2023-04-12 DIAGNOSIS — M533 Sacrococcygeal disorders, not elsewhere classified: Secondary | ICD-10-CM

## 2023-04-12 DIAGNOSIS — Z8719 Personal history of other diseases of the digestive system: Secondary | ICD-10-CM | POA: Diagnosis not present

## 2023-04-12 DIAGNOSIS — Z8052 Family history of malignant neoplasm of bladder: Secondary | ICD-10-CM

## 2023-04-12 DIAGNOSIS — M791 Myalgia, unspecified site: Secondary | ICD-10-CM

## 2023-04-12 DIAGNOSIS — Z8261 Family history of arthritis: Secondary | ICD-10-CM

## 2023-04-12 DIAGNOSIS — E890 Postprocedural hypothyroidism: Secondary | ICD-10-CM

## 2023-04-12 DIAGNOSIS — Z8041 Family history of malignant neoplasm of ovary: Secondary | ICD-10-CM

## 2023-04-12 DIAGNOSIS — R768 Other specified abnormal immunological findings in serum: Secondary | ICD-10-CM | POA: Diagnosis not present

## 2023-04-12 DIAGNOSIS — M2550 Pain in unspecified joint: Secondary | ICD-10-CM | POA: Diagnosis not present

## 2023-04-12 DIAGNOSIS — M545 Low back pain, unspecified: Secondary | ICD-10-CM | POA: Diagnosis not present

## 2023-04-12 DIAGNOSIS — Z800 Family history of malignant neoplasm of digestive organs: Secondary | ICD-10-CM

## 2023-04-12 DIAGNOSIS — Z9012 Acquired absence of left breast and nipple: Secondary | ICD-10-CM

## 2023-04-12 DIAGNOSIS — Z170 Estrogen receptor positive status [ER+]: Secondary | ICD-10-CM

## 2023-04-12 DIAGNOSIS — Z8639 Personal history of other endocrine, nutritional and metabolic disease: Secondary | ICD-10-CM | POA: Diagnosis not present

## 2023-04-12 DIAGNOSIS — Z801 Family history of malignant neoplasm of trachea, bronchus and lung: Secondary | ICD-10-CM

## 2023-04-12 NOTE — Patient Instructions (Signed)
Low Back Sprain or Strain Rehab Ask your health care provider which exercises are safe for you. Do exercises exactly as told by your health care provider and adjust them as directed. It is normal to feel mild stretching, pulling, tightness, or discomfort as you do these exercises. Stop right away if you feel sudden pain or your pain gets worse. Do not begin these exercises until told by your health care provider. Stretching and range-of-motion exercises These exercises warm up your muscles and joints and improve the movement and flexibility of your back. These exercises also help to relieve pain, numbness, and tingling. Lumbar rotation  Lie on your back on a firm bed or the floor with your knees bent. Straighten your arms out to your sides so each arm forms a 90-degree angle (right angle) with a side of your body. Slowly move (rotate) both of your knees to one side of your body until you feel a stretch in your lower back (lumbar). Try not to let your shoulders lift off the floor. Hold this position for __________ seconds. Tense your abdominal muscles and slowly move your knees back to the starting position. Repeat this exercise on the other side of your body. Repeat __________ times. Complete this exercise __________ times a day. Single knee to chest  Lie on your back on a firm bed or the floor with both legs straight. Bend one of your knees. Use your hands to move your knee up toward your chest until you feel a gentle stretch in your lower back and buttock. Hold your leg in this position by holding on to the front of your knee. Keep your other leg as straight as possible. Hold this position for __________ seconds. Slowly return to the starting position. Repeat with your other leg. Repeat __________ times. Complete this exercise __________ times a day. Prone extension on elbows  Lie on your abdomen on a firm bed or the floor (prone position). Prop yourself up on your elbows. Use your arms  to help lift your chest up until you feel a gentle stretch in your abdomen and your lower back. This will place some of your body weight on your elbows. If this is uncomfortable, try stacking pillows under your chest. Your hips should stay down, against the surface that you are lying on. Keep your hip and back muscles relaxed. Hold this position for __________ seconds. Slowly relax your upper body and return to the starting position. Repeat __________ times. Complete this exercise __________ times a day. Strengthening exercises These exercises build strength and endurance in your back. Endurance is the ability to use your muscles for a long time, even after they get tired. Pelvic tilt This exercise strengthens the muscles that lie deep in the abdomen. Lie on your back on a firm bed or the floor with your legs extended. Bend your knees so they are pointing toward the ceiling and your feet are flat on the floor. Tighten your lower abdominal muscles to press your lower back against the floor. This motion will tilt your pelvis so your tailbone points up toward the ceiling instead of pointing to your feet or the floor. To help with this exercise, you may place a small towel under your lower back and try to push your back into the towel. Hold this position for __________ seconds. Let your muscles relax completely before you repeat this exercise. Repeat __________ times. Complete this exercise __________ times a day. Alternating arm and leg raises  Get on your hands  and knees on a firm surface. If you are on a hard floor, you may want to use padding, such as an exercise mat, to cushion your knees. Line up your arms and legs. Your hands should be directly below your shoulders, and your knees should be directly below your hips. Lift your left leg behind you. At the same time, raise your right arm and straighten it in front of you. Do not lift your leg higher than your hip. Do not lift your arm higher  than your shoulder. Keep your abdominal and back muscles tight. Keep your hips facing the ground. Do not arch your back. Keep your balance carefully, and do not hold your breath. Hold this position for __________ seconds. Slowly return to the starting position. Repeat with your right leg and your left arm. Repeat __________ times. Complete this exercise __________ times a day. Abdominal set with straight leg raise  Lie on your back on a firm bed or the floor. Bend one of your knees and keep your other leg straight. Tense your abdominal muscles and lift your straight leg up, 4-6 inches (10-15 cm) off the ground. Keep your abdominal muscles tight and hold this position for __________ seconds. Do not hold your breath. Do not arch your back. Keep it flat against the ground. Keep your abdominal muscles tense as you slowly lower your leg back to the starting position. Repeat with your other leg. Repeat __________ times. Complete this exercise __________ times a day. Single leg lower with bent knees Lie on your back on a firm bed or the floor. Tense your abdominal muscles and lift your feet off the floor, one foot at a time, so your knees and hips are bent in 90-degree angles (right angles). Your knees should be over your hips and your lower legs should be parallel to the floor. Keeping your abdominal muscles tense and your knee bent, slowly lower one of your legs so your toe touches the ground. Lift your leg back up to return to the starting position. Do not hold your breath. Do not let your back arch. Keep your back flat against the ground. Repeat with your other leg. Repeat __________ times. Complete this exercise __________ times a day. Posture and body mechanics Good posture and healthy body mechanics can help to relieve stress in your body's tissues and joints. Body mechanics refers to the movements and positions of your body while you do your daily activities. Posture is part of body  mechanics. Good posture means: Your spine is in its natural S-curve position (neutral). Your shoulders are pulled back slightly. Your head is not tipped forward (neutral). Follow these guidelines to improve your posture and body mechanics in your everyday activities. Standing  When standing, keep your spine neutral and your feet about hip-width apart. Keep a slight bend in your knees. Your ears, shoulders, and hips should line up. When you do a task in which you stand in one place for a long time, place one foot up on a stable object that is 2-4 inches (5-10 cm) high, such as a footstool. This helps keep your spine neutral. Sitting  When sitting, keep your spine neutral and keep your feet flat on the floor. Use a footrest, if necessary, and keep your thighs parallel to the floor. Avoid rounding your shoulders, and avoid tilting your head forward. When working at a desk or a computer, keep your desk at a height where your hands are slightly lower than your elbows. Slide your  chair under your desk so you are close enough to maintain good posture. When working at a computer, place your monitor at a height where you are looking straight ahead and you do not have to tilt your head forward or downward to look at the screen. Resting When lying down and resting, avoid positions that are most painful for you. If you have pain with activities such as sitting, bending, stooping, or squatting, lie in a position in which your body does not bend very much. For example, avoid curling up on your side with your arms and knees near your chest (fetal position). If you have pain with activities such as standing for a long time or reaching with your arms, lie with your spine in a neutral position and bend your knees slightly. Try the following positions: Lying on your side with a pillow between your knees. Lying on your back with a pillow under your knees. Lifting  When lifting objects, keep your feet at least  shoulder-width apart and tighten your abdominal muscles. Bend your knees and hips and keep your spine neutral. It is important to lift using the strength of your legs, not your back. Do not lock your knees straight out. Always ask for help to lift heavy or awkward objects. This information is not intended to replace advice given to you by your health care provider. Make sure you discuss any questions you have with your health care provider. Document Revised: 11/15/2022 Document Reviewed: 09/29/2020 Elsevier Patient Education  2024 [employer].

## 2023-04-13 DIAGNOSIS — R92341 Mammographic extreme density, right breast: Secondary | ICD-10-CM | POA: Diagnosis not present

## 2023-04-13 DIAGNOSIS — Z1231 Encounter for screening mammogram for malignant neoplasm of breast: Secondary | ICD-10-CM | POA: Diagnosis not present

## 2023-04-14 ENCOUNTER — Encounter: Payer: Self-pay | Admitting: Hematology and Oncology

## 2023-04-14 LAB — COMPLETE METABOLIC PANEL WITH GFR
AG Ratio: 1.8 (calc) (ref 1.0–2.5)
ALT: 48 U/L — ABNORMAL HIGH (ref 6–29)
AST: 26 U/L (ref 10–35)
Albumin: 4.8 g/dL (ref 3.6–5.1)
Alkaline phosphatase (APISO): 119 U/L (ref 31–125)
BUN: 12 mg/dL (ref 7–25)
CO2: 26 mmol/L (ref 20–32)
Calcium: 10.2 mg/dL (ref 8.6–10.2)
Chloride: 102 mmol/L (ref 98–110)
Creat: 0.77 mg/dL (ref 0.50–0.99)
Globulin: 2.7 g/dL (calc) (ref 1.9–3.7)
Glucose, Bld: 94 mg/dL (ref 65–99)
Potassium: 4.4 mmol/L (ref 3.5–5.3)
Sodium: 139 mmol/L (ref 135–146)
Total Bilirubin: 0.6 mg/dL (ref 0.2–1.2)
Total Protein: 7.5 g/dL (ref 6.1–8.1)
eGFR: 96 mL/min/{1.73_m2} (ref >=60)

## 2023-04-14 LAB — CBC WITH DIFFERENTIAL/PLATELET
Absolute Monocytes: 482 cells/uL (ref 200–950)
Basophils Absolute: 31 cells/uL (ref 0–200)
Basophils Relative: 0.5 %
Eosinophils Absolute: 128 cells/uL (ref 15–500)
Eosinophils Relative: 2.1 %
HCT: 40.9 % (ref 35.0–45.0)
Hemoglobin: 13.7 g/dL (ref 11.7–15.5)
Lymphs Abs: 1147 cells/uL (ref 850–3900)
MCH: 29.7 pg (ref 27.0–33.0)
MCHC: 33.5 g/dL (ref 32.0–36.0)
MCV: 88.7 fL (ref 80.0–100.0)
MPV: 10.9 fL (ref 7.5–12.5)
Monocytes Relative: 7.9 %
Neutro Abs: 4313 cells/uL (ref 1500–7800)
Neutrophils Relative %: 70.7 %
Platelets: 290 10*3/uL (ref 140–400)
RBC: 4.61 10*6/uL (ref 3.80–5.10)
RDW: 12.8 % (ref 11.0–15.0)
Total Lymphocyte: 18.8 %
WBC: 6.1 10*3/uL (ref 3.8–10.8)

## 2023-04-14 LAB — SJOGRENS SYNDROME-B EXTRACTABLE NUCLEAR ANTIBODY: SSB (La) (ENA) Antibody, IgG: <1 AI

## 2023-04-14 LAB — PROTEIN / CREATININE RATIO, URINE
Creatinine, Urine: 144 mg/dL (ref 20–275)
Protein/Creat Ratio: 56 mg/g creat (ref 24–184)
Protein/Creatinine Ratio: 0.056 mg/mg creat (ref 0.024–0.184)
Total Protein, Urine: 8 mg/dL (ref 5–24)

## 2023-04-14 LAB — ANTI-SMITH ANTIBODY: ENA SM Ab Ser-aCnc: <1 AI

## 2023-04-14 LAB — ANTI-DNA ANTIBODY, DOUBLE-STRANDED: ds DNA Ab: 1 IU/mL

## 2023-04-14 LAB — SJOGRENS SYNDROME-A EXTRACTABLE NUCLEAR ANTIBODY: SSA (Ro) (ENA) Antibody, IgG: <1 AI

## 2023-04-14 LAB — ANTI-SCLERODERMA ANTIBODY: Scleroderma (Scl-70) (ENA) Antibody, IgG: <1 AI

## 2023-04-14 LAB — C3 AND C4
C3 Complement: 151 mg/dL (ref 83–193)
C4 Complement: 23 mg/dL (ref 15–57)

## 2023-04-14 LAB — RHEUMATOID FACTOR: Rheumatoid fact SerPl-aCnc: <10 IU/mL (ref <14)

## 2023-04-14 LAB — ANA: Anti Nuclear Antibody (ANA): NEGATIVE

## 2023-04-14 LAB — CYCLIC CITRUL PEPTIDE ANTIBODY, IGG: Cyclic Citrullin Peptide Ab: <16 UNITS

## 2023-04-14 LAB — RNP ANTIBODY: Ribonucleic Protein(ENA) Antibody, IgG: <1 AI

## 2023-04-14 LAB — SEDIMENTATION RATE: Sed Rate: 6 mm/h (ref 0–20)

## 2023-04-14 LAB — CK: Total CK: 62 U/L (ref 29–143)

## 2023-04-15 ENCOUNTER — Other Ambulatory Visit: Payer: Self-pay | Admitting: *Deleted

## 2023-04-15 NOTE — Progress Notes (Signed)
CMP shows mildly elevated liver function which is stable.  Patient should avoid NSAIDs and alcohol use.  Rest the labs are within normal limits.  I will discuss results at the follow-up visit.

## 2023-04-18 DIAGNOSIS — M8580 Other specified disorders of bone density and structure, unspecified site: Secondary | ICD-10-CM | POA: Diagnosis not present

## 2023-04-18 DIAGNOSIS — E040 Nontoxic diffuse goiter: Secondary | ICD-10-CM | POA: Diagnosis not present

## 2023-04-18 DIAGNOSIS — E538 Deficiency of other specified B group vitamins: Secondary | ICD-10-CM | POA: Diagnosis not present

## 2023-04-18 DIAGNOSIS — E785 Hyperlipidemia, unspecified: Secondary | ICD-10-CM | POA: Diagnosis not present

## 2023-04-18 DIAGNOSIS — E890 Postprocedural hypothyroidism: Secondary | ICD-10-CM | POA: Diagnosis not present

## 2023-04-19 ENCOUNTER — Other Ambulatory Visit: Payer: Self-pay | Admitting: *Deleted

## 2023-04-19 ENCOUNTER — Telehealth: Payer: Self-pay | Admitting: *Deleted

## 2023-04-19 NOTE — Telephone Encounter (Signed)
Data faxed to [facility] per call to their office at [phone] per pt's request for transfer of care.

## 2023-04-20 ENCOUNTER — Encounter: Payer: Self-pay | Admitting: *Deleted

## 2023-04-25 DIAGNOSIS — R001 Bradycardia, unspecified: Secondary | ICD-10-CM | POA: Diagnosis not present

## 2023-04-25 DIAGNOSIS — R0789 Other chest pain: Secondary | ICD-10-CM | POA: Diagnosis not present

## 2023-04-25 DIAGNOSIS — M5412 Radiculopathy, cervical region: Secondary | ICD-10-CM | POA: Diagnosis not present

## 2023-04-25 DIAGNOSIS — Z882 Allergy status to sulfonamides status: Secondary | ICD-10-CM | POA: Diagnosis not present

## 2023-04-25 DIAGNOSIS — Z79899 Other long term (current) drug therapy: Secondary | ICD-10-CM | POA: Diagnosis not present

## 2023-04-25 DIAGNOSIS — R072 Precordial pain: Secondary | ICD-10-CM | POA: Diagnosis not present

## 2023-04-25 DIAGNOSIS — R079 Chest pain, unspecified: Secondary | ICD-10-CM | POA: Diagnosis not present

## 2023-04-25 DIAGNOSIS — Z853 Personal history of malignant neoplasm of breast: Secondary | ICD-10-CM | POA: Diagnosis not present

## 2023-04-26 NOTE — Progress Notes (Signed)
Office Visit Note  Patient: Tonya Whitehead             Date of Birth: 17-Apr-1976           MRN: [id number]             PCP: Cleatis Polka., MD Referring: Cleatis Polka., MD Visit Date: 05/10/2023 Occupation: @GUAROCC @  Subjective:  Generalized pain in muscles and joints  History of Present Illness: Tonya Whitehead is a 47 y.o. female returns today after the initial consultation.  Patient states that she has been doing core strengthening exercises which have been helpful.  Her husband who is a physical therapist also showed her some exercises.  She went for massage therapy which relieved a lot of her discomfort.  She has been trying to stay on restricted diet which is helpful.  She has not noticed any joint swelling.  She continues to have some discomfort in her lower back and some generalized achiness.  She denies any history of oral ulcers, nasal ulcers, malar rash, photosensitivity, Raynaud's, lymphadenopathy or inflammatory arthritis.    Activities of Daily Living:  Patient reports morning stiffness for 1 hour.   Patient Reports nocturnal pain.  Difficulty dressing/grooming: Denies Difficulty climbing stairs: Denies Difficulty getting out of chair: Denies Difficulty using hands for taps, buttons, cutlery, and/or writing: Denies  Review of Systems  Constitutional:  Negative for fatigue.  HENT:  Negative for mouth sores and mouth dryness.   Eyes:  Negative for dryness.  Respiratory:  Negative for shortness of breath.   Cardiovascular:  Negative for chest pain and palpitations.  Gastrointestinal:  Negative for blood in stool, constipation and diarrhea.  Endocrine: Negative for increased urination.  Genitourinary:  Negative for involuntary urination.  Musculoskeletal:  Positive for joint pain, gait problem, joint pain and morning stiffness. Negative for joint swelling, myalgias, muscle weakness, muscle tenderness and myalgias.  Skin:  Negative for color change, rash,  hair loss and sensitivity to sunlight.  Allergic/Immunologic: Negative for susceptible to infections.  Neurological:  Negative for dizziness and headaches.  Hematological:  Negative for swollen glands.  Psychiatric/Behavioral:  Negative for depressed mood and sleep disturbance. The patient is not nervous/anxious.     PMFS History:  Patient Active Problem List   Diagnosis Date Noted   S/P left mastectomy 01/30/2019   Genetic testing 01/10/2019   Family history of ovarian cancer    Family history of colon cancer    Family history of lung cancer    Family history of bladder cancer    Malignant neoplasm of upper-outer quadrant of left breast in female, estrogen receptor positive (HCC) 12/25/2018   Malignant neoplasm of overlapping sites of left breast in female, estrogen receptor positive (HCC) 12/13/2018    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   Family history of bladder cancer    Family history of colon cancer    Family history of lung cancer    Family history of ovarian cancer     Family History  Problem Relation Age of Onset   Lung cancer Mother 84       smoker   Ovarian cancer Mother 34       died in 07/12/22   Healthy Father    Diabetes Father    Heart disease Father    Colon cancer Maternal Aunt        30s   Rheum arthritis Paternal Aunt  Autoimmune disease Paternal Aunt    Cancer Paternal Aunt        through PGM, blood cancer   Healthy Paternal Grandmother    Bladder Cancer Paternal Grandfather        death was unrelated   Healthy Daughter    Anxiety disorder Daughter    Healthy Daughter    ADD / ADHD Daughter    Healthy Son    Healthy Half-Brother    Healthy Half-Brother    Healthy Half-Sister    Colon polyps Neg Hx    Esophageal cancer Neg Hx    Rectal cancer Neg Hx    Stomach cancer Neg Hx    Past Surgical History:  Procedure Laterality Date   ADENOIDECTOMY     as a child   BREAST EXCISIONAL  BIOPSY Left 2002   reconstructive surgery on nipple to match right breast   IR IMAGING GUIDED PORT INSERTION  03/08/2019   IR REMOVAL TUN ACCESS W/ PORT W/O FL MOD SED  08/09/2019   MASTECTOMY Left    ROBOTIC ASSISTED SALPINGO OOPHERECTOMY Bilateral 11/05/2019   Procedure: XI ROBOTIC ASSISTED SALPINGO OOPHORECTOMY;  Surgeon: Carver Fila, MD;  Location: [facility];  Service: Gynecology;  Laterality: Bilateral;   THYROIDECTOMY Bilateral    2020   Social History   Social History Narrative   From [place] area. Parents divorced when she was 6. She has 2 older half-brothers and 1 older half sister. No abuse.    Married 20 years, has 3 kids      Legal-none   Caffeine- 1 mug of coffee, occas coffee in afternoon   Religion-Christian          Immunization History  Administered Date(s) Administered   Influenza,inj,Quad PF,6+ Mos 06/13/2019   PFIZER(Purple Top)SARS-COV-2 Vaccination 09/06/2019, 09/29/2019     Objective: Vital Signs: BP 102/69 (BP Location: Right Arm, Patient Position: Sitting, Cuff Size: Normal)   Pulse 77   Resp 16   Ht 5\' 4"  (1.626 m)   Wt 146 lb 12.8 oz (66.6 kg)   BMI 25.20 kg/m    Physical Exam Vitals and nursing note reviewed.  Constitutional:      Appearance: She is well-developed.  HENT:     Head: Normocephalic and atraumatic.  Eyes:     Conjunctiva/sclera: Conjunctivae normal.  Cardiovascular:     Rate and Rhythm: Normal rate and regular rhythm.     Heart sounds: Normal heart sounds.  Pulmonary:     Effort: Pulmonary effort is normal.     Breath sounds: Normal breath sounds.  Abdominal:     General: Bowel sounds are normal.     Palpations: Abdomen is soft.  Musculoskeletal:     Cervical back: Normal range of motion.  Lymphadenopathy:     Cervical: No cervical adenopathy.  Skin:    General: Skin is warm and dry.     Capillary Refill: Capillary refill takes less than 2 seconds.  Neurological:     Mental Status: She is alert  and oriented to person, place, and time.  Psychiatric:        Behavior: Behavior normal.      Musculoskeletal Exam: Cervical, thoracic and lumbar spine 1 good range of motion.  She has some discomfort range of motion of her lumbar spine.  She had no tenderness over SI joints today.  Shoulder joints, elbow joints, wrist joints, MCPs PIPs and DIPs been good range of motion.  Bilateral PIP and DIP prominence was noted.  Hip joints and knee joints were in good range of motion.  There was no tenderness over ankles or MTPs.  CDAI Exam: CDAI Score: -- Patient Global: --; Provider Global: -- Swollen: --; Tender: -- Joint Exam 05/10/2023   No joint exam has been documented for this visit   There is currently no information documented on the homunculus. Go to the Rheumatology activity and complete the homunculus joint exam.  Investigation: No additional findings.  Imaging: XR Lumbar Spine 2-3 Views  Result Date: 04/12/2023 Levoscoliosis was noted.  No significant disc space narrowing was noted.  Facet joint arthropathy was noted. Impression: Levoscoliosis and facet joint arthropathy was noted.  XR Pelvis 1-2 Views  Result Date: 04/12/2023 No SI joint sclerosis or narrowing was noted.  No hip joint narrowing was noted.   Recent Labs: Lab Results  Component Value Date   WBC 6.1 04/12/2023   HGB 13.7 04/12/2023   PLT 290 04/12/2023   NA 139 04/12/2023   K 4.4 04/12/2023   CL 102 04/12/2023   CO2 26 04/12/2023   GLUCOSE 94 04/12/2023   BUN 12 04/12/2023   CREATININE 0.77 04/12/2023   BILITOT 0.6 04/12/2023   ALKPHOS 89 04/27/2022   AST 26 04/12/2023   ALT 48 (H) 04/12/2023   PROT 7.5 04/12/2023   ALBUMIN 4.5 04/27/2022   CALCIUM 10.2 04/12/2023   GFRAA >60 04/14/2020   April 12, 2023 ANA negative, ENA (double-stranded ENA, SSA, SSB, Smith, RNP, SCL 70) negative, RF negative, anti-CCP negative, ESR 6, urine protein creatinine ratio normal, CK 62 Speciality Comments: No  specialty comments available.  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  Her symptoms started at age 30.  Some improvement on restricted diet was noted by the patient.  She states she has noticed some improvement since the last visit after doing some stretching exercises and also massage therapy.  Positive ANA (antinuclear antibody) - Repeat ANA negative, ENA negative, complements normal.  Sed rate normal.  Labs were reviewed with the patient.  Chronic midline low back pain without sciatica - since 2013.  X-rays obtained at the last visit showed levoscoliosis and facet joint arthropathy.  X-ray findings reviewed with the patient.  Patient's husband is a physical therapist and she will continue to do exercises under his supervision.  Chronic SI joint pain - History of intermittent SI joint pain.  She has intermittent discomfort in the SI joints.  She had not much discomfort today.  X-rays of the pelvis obtained at the last visit show no SI joint sclerosis or narrowing.  X-ray was reviewed with the patient.  Myalgia - History of myalgia and hyperalgesia.  Possible myofascial pain syndrome was discussed.  CK is normal.  Other medical problems are listed as follows:  Malignant neoplasm of upper-outer quadrant of left breast in female, estrogen receptor positive (HCC) - she was diagnosed with left breast cancer in 2020, status post left mastectomy, chemotherapy, radiation therapy.  On letrozole.  S/P bilateral oophorectomy  S/P left mastectomy  B12 deficiency  History of gluten intolerance  History of hyperlipidemia  Postprocedural hypothyroidism  Empty sella turcica (HCC)  Family history of rheumatoid arthritis-paternal aunt  Orders: No orders of the defined types were placed in this  encounter.  No orders of the defined types were placed in this encounter.  Follow-Up Instructions: Return if symptoms worsen or fail to improve, for Osteoarthritis.   Pollyann Savoy, MD  Note - This record has been created using Animal nutritionist.  Chart creation errors have been sought, but may not always  have been located. Such creation errors do not reflect on  the standard of medical care.

## 2023-04-28 ENCOUNTER — Ambulatory Visit: Payer: 59 | Admitting: Hematology and Oncology

## 2023-04-28 DIAGNOSIS — M8580 Other specified disorders of bone density and structure, unspecified site: Secondary | ICD-10-CM | POA: Diagnosis not present

## 2023-04-28 DIAGNOSIS — R768 Other specified abnormal immunological findings in serum: Secondary | ICD-10-CM | POA: Diagnosis not present

## 2023-04-28 DIAGNOSIS — Z0000 Encounter for general adult medical examination without abnormal findings: Secondary | ICD-10-CM | POA: Diagnosis not present

## 2023-04-28 DIAGNOSIS — E236 Other disorders of pituitary gland: Secondary | ICD-10-CM | POA: Diagnosis not present

## 2023-04-28 DIAGNOSIS — E785 Hyperlipidemia, unspecified: Secondary | ICD-10-CM | POA: Diagnosis not present

## 2023-04-28 DIAGNOSIS — E890 Postprocedural hypothyroidism: Secondary | ICD-10-CM | POA: Diagnosis not present

## 2023-04-28 DIAGNOSIS — R7301 Impaired fasting glucose: Secondary | ICD-10-CM | POA: Diagnosis not present

## 2023-04-28 DIAGNOSIS — Z170 Estrogen receptor positive status [ER+]: Secondary | ICD-10-CM | POA: Diagnosis not present

## 2023-04-28 DIAGNOSIS — Z1339 Encounter for screening examination for other mental health and behavioral disorders: Secondary | ICD-10-CM | POA: Diagnosis not present

## 2023-04-28 DIAGNOSIS — Z1331 Encounter for screening for depression: Secondary | ICD-10-CM | POA: Diagnosis not present

## 2023-04-28 DIAGNOSIS — Z860101 Personal history of adenomatous and serrated colon polyps: Secondary | ICD-10-CM | POA: Diagnosis not present

## 2023-04-28 DIAGNOSIS — C50912 Malignant neoplasm of unspecified site of left female breast: Secondary | ICD-10-CM | POA: Diagnosis not present

## 2023-05-10 ENCOUNTER — Encounter: Payer: Self-pay | Admitting: Rheumatology

## 2023-05-10 ENCOUNTER — Ambulatory Visit: Payer: 59 | Attending: Rheumatology | Admitting: Rheumatology

## 2023-05-10 VITALS — BP 102/69 | HR 77 | Resp 16 | Ht 64.0 in | Wt 146.8 lb

## 2023-05-10 DIAGNOSIS — C50412 Malignant neoplasm of upper-outer quadrant of left female breast: Secondary | ICD-10-CM

## 2023-05-10 DIAGNOSIS — Z5181 Encounter for therapeutic drug level monitoring: Secondary | ICD-10-CM | POA: Diagnosis not present

## 2023-05-10 DIAGNOSIS — Z8639 Personal history of other endocrine, nutritional and metabolic disease: Secondary | ICD-10-CM

## 2023-05-10 DIAGNOSIS — M8580 Other specified disorders of bone density and structure, unspecified site: Secondary | ICD-10-CM | POA: Diagnosis not present

## 2023-05-10 DIAGNOSIS — E890 Postprocedural hypothyroidism: Secondary | ICD-10-CM | POA: Diagnosis not present

## 2023-05-10 DIAGNOSIS — G8929 Other chronic pain: Secondary | ICD-10-CM

## 2023-05-10 DIAGNOSIS — Z170 Estrogen receptor positive status [ER+]: Secondary | ICD-10-CM | POA: Diagnosis not present

## 2023-05-10 DIAGNOSIS — Z9012 Acquired absence of left breast and nipple: Secondary | ICD-10-CM | POA: Diagnosis not present

## 2023-05-10 DIAGNOSIS — Z8261 Family history of arthritis: Secondary | ICD-10-CM

## 2023-05-10 DIAGNOSIS — M791 Myalgia, unspecified site: Secondary | ICD-10-CM

## 2023-05-10 DIAGNOSIS — E236 Other disorders of pituitary gland: Secondary | ICD-10-CM

## 2023-05-10 DIAGNOSIS — R768 Other specified abnormal immunological findings in serum: Secondary | ICD-10-CM | POA: Diagnosis not present

## 2023-05-10 DIAGNOSIS — M533 Sacrococcygeal disorders, not elsewhere classified: Secondary | ICD-10-CM | POA: Diagnosis not present

## 2023-05-10 DIAGNOSIS — M2550 Pain in unspecified joint: Secondary | ICD-10-CM

## 2023-05-10 DIAGNOSIS — Z8719 Personal history of other diseases of the digestive system: Secondary | ICD-10-CM

## 2023-05-10 DIAGNOSIS — M545 Low back pain, unspecified: Secondary | ICD-10-CM | POA: Diagnosis not present

## 2023-05-10 DIAGNOSIS — E538 Deficiency of other specified B group vitamins: Secondary | ICD-10-CM

## 2023-05-10 DIAGNOSIS — Z9189 Other specified personal risk factors, not elsewhere classified: Secondary | ICD-10-CM | POA: Diagnosis not present

## 2023-05-10 DIAGNOSIS — C50812 Malignant neoplasm of overlapping sites of left female breast: Secondary | ICD-10-CM | POA: Diagnosis not present

## 2023-05-10 DIAGNOSIS — Z79811 Long term (current) use of aromatase inhibitors: Secondary | ICD-10-CM | POA: Diagnosis not present

## 2023-05-12 ENCOUNTER — Encounter: Payer: Self-pay | Admitting: Physician Assistant

## 2023-05-12 ENCOUNTER — Ambulatory Visit (INDEPENDENT_AMBULATORY_CARE_PROVIDER_SITE_OTHER): Payer: 59 | Admitting: Physician Assistant

## 2023-05-12 DIAGNOSIS — F40243 Fear of flying: Secondary | ICD-10-CM

## 2023-05-12 DIAGNOSIS — F900 Attention-deficit hyperactivity disorder, predominantly inattentive type: Secondary | ICD-10-CM

## 2023-05-12 MED ORDER — VYVANSE 20 MG PO CAPS: 20.0000 mg | ORAL_CAPSULE | Freq: Every day | ORAL | 0 refills | Status: DC

## 2023-05-12 NOTE — Progress Notes (Signed)
[facility]  Patient ID: Tonya Whitehead,  MRN: [id number]  PCP: Cleatis Polka., MD  Date of Evaluation: 05/12/2023 Time spent:20 minutes  Chief Complaint:  Chief Complaint   ADHD; Follow-up    HISTORY/CURRENT STATUS: HPI  for 3 month med check.   Tonya Whitehead is doing well.  She has been out of the Vyvanse for a few weeks, she had to get up early because of a trip out of the country last month.  She has been taking the Adderall sporadically.  She feels a crash with it, it does not work as well as the Vyvanse and it also keeps her awake no matter how early in the afternoon she takes it.  But she is doing great when she has the Vyvanse.  States that attention is good without easy distractibility.  Able to focus on things and finish tasks to completion.   Patient is able to enjoy things.  Energy and motivation are good.  Work is going well. 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  She is enjoying not being so swamped. No extreme sadness, tearfulness, or feelings of hopelessness.  Sleeps well most of the time. ADLs and personal hygiene are normal.  Appetite has not changed.  Weight is stable.  No complaints of anxiety.  Denies suicidal or homicidal thoughts.  Patient denies increased energy with decreased need for sleep, increased talkativeness, racing thoughts, impulsivity or risky behaviors, increased spending, increased libido, grandiosity, increased irritability or anger, paranoia, or hallucinations.  Denies dizziness, syncope, seizures, numbness, tingling, tremor, tics, unsteady gait, slurred speech, confusion. Denies muscle or joint pain, stiffness, or dystonia.  Individual Medical History/ Review of Systems: Changes? :No she decided to cancel the breast reconstructive surgery she was planning to have next month.  She would probably still have it done at some point but just not  now.  Past medications for mental health diagnoses include: SSRI post partum for anxiety.    No mental health hospitalizations.   Allergies: Gluten meal, Other, Skin adhesives [cyanoacrylate], Sulfa antibiotics, and Latex  Current Medications:  Current Outpatient Medications:    amphetamine-dextroamphetamine (ADDERALL) 10 MG tablet, Take 10 mg by mouth daily as needed., Disp: , Rfl:    letrozole (FEMARA) 2.5 MG tablet, TAKE ONE TABLET BY MOUTH DAILY, Disp: 90 tablet, Rfl: 2   levothyroxine (SYNTHROID) 100 MCG tablet, Take 100 mcg by mouth daily., Disp: , Rfl:    Magnesium 400 MG CAPS, Take by mouth., Disp: , Rfl:    [START ON 06/11/2023] VYVANSE 20 MG capsule, Take 1 capsule (20 mg total) by mouth daily., Disp: 30 capsule, Rfl: 0   [START ON 07/10/2023] VYVANSE 20 MG capsule, Take 1 capsule (20 mg total) by mouth daily., Disp: 30 capsule, Rfl: 0   VYVANSE 20 MG capsule, Take 1 capsule (20 mg total) by mouth daily., Disp: 30 capsule, Rfl: 0 Medication Side Effects: none  Family Medical/ Social History: Changes? No  MENTAL HEALTH EXAM:  There were no vitals taken for this visit.There is no height or weight on file to calculate BMI.  General Appearance: Casual and Well Groomed  Eye Contact:  Good  Speech:  Clear and Coherent and Normal Rate  Volume:  Normal  Mood:  Euthymic  Affect:  Congruent  Thought Process:  Goal Directed and Descriptions of Associations: Circumstantial  Orientation:  Full (Time, Place, and Person)  Thought Content: Logical   Suicidal Thoughts:  No  Homicidal Thoughts:  No  Memory:  WNL  Judgement:  Good  Insight:  Good  Psychomotor Activity:  Normal  Concentration:  Concentration: Good and Attention Span: Good  Recall:  Good  Fund of Knowledge: Good  Language: Good  Assets:  Communication Skills Desire for Improvement Financial Resources/Insurance Housing Physical Health Resilience Transportation Vocational/Educational  ADL's:  Intact   Cognition: WNL  Prognosis:  Good   DIAGNOSES:    ICD-10-CM   1. Attention deficit hyperactivity disorder (ADHD), predominantly inattentive type  F90.0     2. 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  Vyvanse filled 03/29/2023.  Xanax filled 02/03/2023.  Adderall filled 12/31/2022. I provided 20 minutes of face to face time during this encounter, including time spent before and after the visit in records review, medical decision making, counseling pertinent to today's visit, and charting.   She is doing well with the Vyvanse so we will continue the same treatment.  As far as the insomnia when she takes the Adderall, take 1/2 pill and no later than 2 PM to see if that will help.  Continue Xanax 0.5 mg, 1/2-1 every day prn flying.  Continue Adderall 10 mg, 1/2-1 in afternoon and early evening prn (she hasn't taken it at all.) Continue Vyvanse 20 mg, 1 q am. Return in 3 months.  Melony Overly, PA-C

## 2023-07-07 DIAGNOSIS — L814 Other melanin hyperpigmentation: Secondary | ICD-10-CM | POA: Diagnosis not present

## 2023-07-07 DIAGNOSIS — Z85828 Personal history of other malignant neoplasm of skin: Secondary | ICD-10-CM | POA: Diagnosis not present

## 2023-07-07 DIAGNOSIS — L538 Other specified erythematous conditions: Secondary | ICD-10-CM | POA: Diagnosis not present

## 2023-07-07 DIAGNOSIS — D225 Melanocytic nevi of trunk: Secondary | ICD-10-CM | POA: Diagnosis not present

## 2023-07-07 DIAGNOSIS — L821 Other seborrheic keratosis: Secondary | ICD-10-CM | POA: Diagnosis not present

## 2023-07-07 DIAGNOSIS — Z08 Encounter for follow-up examination after completed treatment for malignant neoplasm: Secondary | ICD-10-CM | POA: Diagnosis not present

## 2023-07-07 DIAGNOSIS — L820 Inflamed seborrheic keratosis: Secondary | ICD-10-CM | POA: Diagnosis not present

## 2023-07-13 DIAGNOSIS — C50812 Malignant neoplasm of overlapping sites of left female breast: Secondary | ICD-10-CM | POA: Diagnosis not present

## 2023-07-13 DIAGNOSIS — Z79811 Long term (current) use of aromatase inhibitors: Secondary | ICD-10-CM | POA: Diagnosis not present

## 2023-07-13 DIAGNOSIS — C50412 Malignant neoplasm of upper-outer quadrant of left female breast: Secondary | ICD-10-CM | POA: Diagnosis not present

## 2023-07-13 DIAGNOSIS — Z9189 Other specified personal risk factors, not elsewhere classified: Secondary | ICD-10-CM | POA: Diagnosis not present

## 2023-07-13 DIAGNOSIS — Z5181 Encounter for therapeutic drug level monitoring: Secondary | ICD-10-CM | POA: Diagnosis not present

## 2023-07-13 DIAGNOSIS — Z9012 Acquired absence of left breast and nipple: Secondary | ICD-10-CM | POA: Diagnosis not present

## 2023-07-13 DIAGNOSIS — M8580 Other specified disorders of bone density and structure, unspecified site: Secondary | ICD-10-CM | POA: Diagnosis not present

## 2023-07-13 DIAGNOSIS — Z170 Estrogen receptor positive status [ER+]: Secondary | ICD-10-CM | POA: Diagnosis not present

## 2023-07-28 DIAGNOSIS — C50412 Malignant neoplasm of upper-outer quadrant of left female breast: Secondary | ICD-10-CM | POA: Diagnosis not present

## 2023-07-28 DIAGNOSIS — M818 Other osteoporosis without current pathological fracture: Secondary | ICD-10-CM | POA: Diagnosis not present

## 2023-07-28 DIAGNOSIS — Z170 Estrogen receptor positive status [ER+]: Secondary | ICD-10-CM | POA: Diagnosis not present

## 2023-07-28 DIAGNOSIS — Z79899 Other long term (current) drug therapy: Secondary | ICD-10-CM | POA: Diagnosis not present

## 2023-07-28 DIAGNOSIS — Z5181 Encounter for therapeutic drug level monitoring: Secondary | ICD-10-CM | POA: Diagnosis not present

## 2023-08-02 ENCOUNTER — Other Ambulatory Visit: Payer: Self-pay | Admitting: Hematology and Oncology

## 2023-08-02 NOTE — Telephone Encounter (Signed)
 Pt transferred care to [facility]

## 2023-08-03 ENCOUNTER — Telehealth: Payer: Self-pay | Admitting: Physician Assistant

## 2023-08-03 MED ORDER — LISDEXAMFETAMINE DIMESYLATE 20 MG PO CAPS: 20.0000 mg | ORAL_CAPSULE | Freq: Every day | ORAL | 0 refills | Status: DC

## 2023-08-03 NOTE — Telephone Encounter (Signed)
 She called for new Vyvanse Rx. Has an appt in a few weeks but will run out in a few days. It's still working well. Rx sent.

## 2023-08-16 ENCOUNTER — Ambulatory Visit (INDEPENDENT_AMBULATORY_CARE_PROVIDER_SITE_OTHER): Payer: 59 | Admitting: Physician Assistant

## 2023-08-16 ENCOUNTER — Encounter: Payer: Self-pay | Admitting: Physician Assistant

## 2023-08-16 VITALS — BP 107/62 | HR 77

## 2023-08-16 DIAGNOSIS — F40243 Fear of flying: Secondary | ICD-10-CM | POA: Diagnosis not present

## 2023-08-16 DIAGNOSIS — F900 Attention-deficit hyperactivity disorder, predominantly inattentive type: Secondary | ICD-10-CM | POA: Diagnosis not present

## 2023-08-16 MED ORDER — VYVANSE 20 MG PO CAPS: 20.0000 mg | ORAL_CAPSULE | Freq: Every day | ORAL | 0 refills | Status: DC

## 2023-08-16 NOTE — Progress Notes (Signed)
[facility]  Patient ID: Tonya Whitehead,  MRN: [id number]  PCP: Cleatis Polka., MD  Date of Evaluation: 08/16/2023 Time spent:20 minutes  Chief Complaint:  Chief Complaint   ADHD; Follow-up    HISTORY/CURRENT STATUS: HPI  for routine med check.  Doing well. Vyvanse is working very well. Doesn't take the Adderall at all. States that attention is good without easy distractibility.  Able to focus on things and finish tasks to completion.   Patient is able to enjoy things.  Energy and motivation are good.  Work is going well.   No extreme sadness, tearfulness, or feelings of hopelessness.  Sleeps well most of the time. ADLs and personal hygiene are normal.  Appetite has not changed.  Weight is stable.  No reports of anxiety.  Denies suicidal or homicidal thoughts.  Denies dizziness, syncope, seizures, numbness, tingling, tremor, tics, unsteady gait, slurred speech, confusion. Denies muscle or joint pain, stiffness, or dystonia.  Individual Medical History/ Review of Systems: Changes? :No   Past medications for mental health diagnoses include: SSRI post partum for anxiety.    No mental health hospitalizations.   Allergies: Gluten meal, Other, Skin adhesives [cyanoacrylate], Sulfa antibiotics, and Latex  Current Medications:  Current Outpatient Medications:    letrozole (FEMARA) 2.5 MG tablet, TAKE ONE TABLET BY MOUTH DAILY, Disp: 90 tablet, Rfl: 2   levothyroxine (SYNTHROID) 100 MCG tablet, Take 100 mcg by mouth daily., Disp: , Rfl:    lisdexamfetamine (VYVANSE) 20 MG capsule, Take 1 capsule (20 mg total) by mouth daily., Disp: 30 capsule, Rfl: 0   Magnesium 400 MG CAPS, Take by mouth., Disp: , Rfl:    [START ON 10/29/2023] VYVANSE 20 MG capsule, Take 1 capsule (20 mg total) by mouth daily., Disp: 30 capsule, Rfl: 0   amphetamine-dextroamphetamine (ADDERALL) 10 MG tablet, Take 10 mg by mouth daily as needed. (Patient not taking: Reported on 08/16/2023), Disp: , Rfl:     [START ON 10/01/2023] VYVANSE 20 MG capsule, Take 1 capsule (20 mg total) by mouth daily., Disp: 30 capsule, Rfl: 0   [START ON 09/06/2023] VYVANSE 20 MG capsule, Take 1 capsule (20 mg total) by mouth daily., Disp: 30 capsule, Rfl: 0 Medication Side Effects: none  Family Medical/ Social History: Changes? No  MENTAL HEALTH EXAM:  Blood pressure 107/62, pulse 77.There is no height or weight on file to calculate BMI.  General Appearance: Casual and Well Groomed  Eye Contact:  Good  Speech:  Clear and Coherent and Normal Rate  Volume:  Normal  Mood:  Euthymic  Affect:  Congruent  Thought Process:  Goal Directed and Descriptions of Associations: Circumstantial  Orientation:  Full (Time, Place, and Person)  Thought Content: Logical   Suicidal Thoughts:  No  Homicidal Thoughts:  No  Memory:  WNL  Judgement:  Good  Insight:  Good  Psychomotor Activity:  Normal  Concentration:  Concentration: Good and Attention Span: Good  Recall:  Good  Fund of Knowledge: Good  Language: Good  Assets:  Communication Skills Desire for Improvement Financial Resources/Insurance Housing Physical Health Resilience Social Support Transportation Vocational/Educational  ADL's:  Intact  Cognition: WNL  Prognosis:  Good   DIAGNOSES:    ICD-10-CM   1. Attention deficit hyperactivity disorder (ADHD), predominantly inattentive type  F90.0     2. 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  Vyvanse filled 08/08/2023. I provided 20 minutes of face to face  time during this encounter, including time spent before and after the visit in records review, medical decision making, counseling pertinent to today's visit, and charting.   Ms. Joseph Art are working as intended so no changes will be made. Continue Xanax 0.5 mg, 1/2-1 every day prn flying.  Continue Adderall 10 mg, 1/2-1 in afternoon and early evening prn (she never takes it) Continue Vyvanse 20 mg, 1 q  am. Return in 6 months.  Melony Overly, PA-C

## 2023-09-15 DIAGNOSIS — F4323 Adjustment disorder with mixed anxiety and depressed mood: Secondary | ICD-10-CM | POA: Diagnosis not present

## 2023-09-27 DIAGNOSIS — F4323 Adjustment disorder with mixed anxiety and depressed mood: Secondary | ICD-10-CM | POA: Diagnosis not present

## 2023-10-10 DIAGNOSIS — F4323 Adjustment disorder with mixed anxiety and depressed mood: Secondary | ICD-10-CM | POA: Diagnosis not present

## 2023-10-17 DIAGNOSIS — C50412 Malignant neoplasm of upper-outer quadrant of left female breast: Secondary | ICD-10-CM | POA: Diagnosis not present

## 2023-10-17 DIAGNOSIS — Z170 Estrogen receptor positive status [ER+]: Secondary | ICD-10-CM | POA: Diagnosis not present

## 2023-10-17 DIAGNOSIS — Z5181 Encounter for therapeutic drug level monitoring: Secondary | ICD-10-CM | POA: Diagnosis not present

## 2023-10-17 DIAGNOSIS — Z79899 Other long term (current) drug therapy: Secondary | ICD-10-CM | POA: Diagnosis not present

## 2023-10-17 DIAGNOSIS — M818 Other osteoporosis without current pathological fracture: Secondary | ICD-10-CM | POA: Diagnosis not present

## 2023-10-17 DIAGNOSIS — C50812 Malignant neoplasm of overlapping sites of left female breast: Secondary | ICD-10-CM | POA: Diagnosis not present

## 2023-10-25 DIAGNOSIS — F4323 Adjustment disorder with mixed anxiety and depressed mood: Secondary | ICD-10-CM | POA: Diagnosis not present

## 2023-11-16 DIAGNOSIS — F4323 Adjustment disorder with mixed anxiety and depressed mood: Secondary | ICD-10-CM | POA: Diagnosis not present

## 2023-12-06 DIAGNOSIS — F4323 Adjustment disorder with mixed anxiety and depressed mood: Secondary | ICD-10-CM | POA: Diagnosis not present

## 2023-12-15 ENCOUNTER — Other Ambulatory Visit: Payer: Self-pay | Admitting: Physician Assistant

## 2023-12-15 MED ORDER — VYVANSE 20 MG PO CAPS: 20.0000 mg | ORAL_CAPSULE | Freq: Every day | ORAL | 0 refills | Status: DC

## 2024-01-10 ENCOUNTER — Other Ambulatory Visit: Payer: Self-pay | Admitting: Hematology and Oncology

## 2024-02-13 DIAGNOSIS — C50412 Malignant neoplasm of upper-outer quadrant of left female breast: Secondary | ICD-10-CM | POA: Diagnosis not present

## 2024-02-13 DIAGNOSIS — Z170 Estrogen receptor positive status [ER+]: Secondary | ICD-10-CM | POA: Diagnosis not present

## 2024-02-13 DIAGNOSIS — Z9012 Acquired absence of left breast and nipple: Secondary | ICD-10-CM | POA: Diagnosis not present

## 2024-02-14 ENCOUNTER — Encounter: Payer: Self-pay | Admitting: Physician Assistant

## 2024-02-14 ENCOUNTER — Ambulatory Visit (INDEPENDENT_AMBULATORY_CARE_PROVIDER_SITE_OTHER): Payer: 59 | Admitting: Physician Assistant

## 2024-02-14 DIAGNOSIS — F900 Attention-deficit hyperactivity disorder, predominantly inattentive type: Secondary | ICD-10-CM | POA: Diagnosis not present

## 2024-02-14 DIAGNOSIS — F40243 Fear of flying: Secondary | ICD-10-CM

## 2024-02-14 MED ORDER — LISDEXAMFETAMINE DIMESYLATE 30 MG PO CAPS: 30.0000 mg | ORAL_CAPSULE | Freq: Every day | ORAL | 0 refills | Status: DC

## 2024-02-14 NOTE — Progress Notes (Signed)
 [facility]  Patient ID: Tonya Whitehead,  MRN: [id number]  PCP: Tonya Whitehead., MD  Date of Evaluation: 02/14/2024 Time spent:20 minutes  Chief Complaint:  Chief Complaint   ADHD; Follow-up; Anxiety    HISTORY/CURRENT STATUS: HPI  for routine med check.  Vyvanse  had been working well until 2 months ago.  It's like placebo now.  She's read that the brand name of this med, which is what she's been taking, is now made in [place] and a [place]. She belongs to several groups of people with ADHD and many who take the brand name say it's not effective either. [name] asks if we need to change meds, the dose, or what? Up until this, it had been working great.   Patient is able to enjoy things.  Energy and motivation are good.  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  No extreme sadness, tearfulness, or feelings of hopelessness.  Sleeps well most of the time. ADLs and personal hygiene are normal. Appetite has not changed.  Weight is stable. No SI/HI.  Denies dizziness, syncope, seizures, numbness, tingling, tremor, tics, unsteady gait, slurred speech, confusion. Denies muscle or joint pain, stiffness, or dystonia.  Individual Medical History/ Review of Systems: Changes? :No   Past medications for mental health diagnoses include: SSRI post partum for anxiety.    No mental health hospitalizations.   Allergies: Gluten meal, Other, Skin adhesives [cyanoacrylate], Sulfa antibiotics, and Latex  Current Medications:  Current Outpatient Medications:    amphetamine -dextroamphetamine  (ADDERALL) 10 MG tablet, Take 10 mg by mouth daily as needed., Disp: , Rfl:    letrozole  (FEMARA ) 2.5 MG tablet, TAKE ONE TABLET BY MOUTH DAILY, Disp: 90 tablet, Rfl: 2   levothyroxine  (SYNTHROID ) 100 MCG tablet, Take 100 mcg by mouth daily., Disp: , Rfl:    Magnesium 400 MG CAPS, Take by mouth., Disp: , Rfl:    lisdexamfetamine (VYVANSE ) 30 MG capsule, Take 1 capsule (30  mg total) by mouth daily., Disp: 30 capsule, Rfl: 0 Medication Side Effects: none  Family Medical/ Social History: Changes? No  MENTAL HEALTH EXAM:  There were no vitals taken for this visit.There is no height or weight on file to calculate BMI.  General Appearance: Casual and Well Groomed  Eye Contact:  Good  Speech:  Clear and Coherent and Normal Rate  Volume:  Normal  Mood:  Euthymic  Affect:  Congruent  Thought Process:  Goal Directed and Descriptions of Associations: Circumstantial  Orientation:  Full (Time, Place, and Person)  Thought Content: Logical   Suicidal Thoughts:  No  Homicidal Thoughts:  No  Memory:  WNL  Judgement:  Good  Insight:  Good  Psychomotor Activity:  Normal  Concentration:  Concentration: NA and Attention Span: Fair  Recall:  Good  Fund of Knowledge: Good  Language: Good  Assets:  Communication Skills Desire for Improvement Financial Resources/Insurance Housing Physical Health Resilience Social Support Transportation Vocational/Educational  ADL's:  Intact  Cognition: WNL  Prognosis:  Good   DIAGNOSES:    ICD-10-CM   1. Attention deficit hyperactivity disorder (ADHD), predominantly inattentive type  F90.0     2. 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  Vyvanse  filled 01/28/2024.  I provided approximately  20  minutes of face to face time during this encounter, including time spent before and after the visit in records review, medical decision making, counseling pertinent to today's  visit, and charting.   Discussed the decreased efficacy of the Vyvanse . I recommend increasing the dose to start with. It could be that the current dose isn't effective for her anymore. In approx 3 weeks, she can call, let me know if it's effective or not, and I'll send in more Rx. She understands.   Continue Xanax  0.5 mg, 1/2-1 every day prn flying.  Continue Adderall 10 mg, 1/2-1 in afternoon and early  evening prn (she never takes it) Continue Vyvanse  20 mg, 1 q am. Return in 3-4 months.  Verneita Cooks, PA-C

## 2024-03-23 ENCOUNTER — Telehealth (HOSPITAL_BASED_OUTPATIENT_CLINIC_OR_DEPARTMENT_OTHER): Payer: Self-pay

## 2024-03-23 NOTE — Telephone Encounter (Signed)
 Patient has called the office twice concerned about a billing issue that she is having. I called patient to speak with her and did not get an answer. However, I did give her the number to the billing department. Patient called back again today wanting to know why it cost so much for us  to send her urine out for a culture. She states this is the second time that this has happened. She would like for someone to give her a call.

## 2024-03-30 ENCOUNTER — Telehealth: Payer: Self-pay

## 2024-03-30 ENCOUNTER — Other Ambulatory Visit: Payer: Self-pay | Admitting: Physician Assistant

## 2024-03-30 MED ORDER — VYVANSE 30 MG PO CAPS: 30.0000 mg | ORAL_CAPSULE | Freq: Every day | ORAL | 0 refills | Status: DC

## 2024-03-30 NOTE — Telephone Encounter (Signed)
 Rx for generic Vyvanse  changed to Brand.

## 2024-04-10 ENCOUNTER — Other Ambulatory Visit: Payer: Self-pay | Admitting: Physician Assistant

## 2024-04-10 MED ORDER — LISDEXAMFETAMINE DIMESYLATE 20 MG PO CAPS: 20.0000 mg | ORAL_CAPSULE | Freq: Every day | ORAL | 0 refills | Status: DC

## 2024-04-11 ENCOUNTER — Other Ambulatory Visit: Payer: Self-pay | Admitting: Physician Assistant

## 2024-04-11 MED ORDER — LISDEXAMFETAMINE DIMESYLATE 20 MG PO CAPS: 20.0000 mg | ORAL_CAPSULE | Freq: Every day | ORAL | 0 refills | Status: DC

## 2024-04-16 DIAGNOSIS — Z08 Encounter for follow-up examination after completed treatment for malignant neoplasm: Secondary | ICD-10-CM | POA: Diagnosis not present

## 2024-04-16 DIAGNOSIS — Z23 Encounter for immunization: Secondary | ICD-10-CM | POA: Diagnosis not present

## 2024-04-16 DIAGNOSIS — Z5181 Encounter for therapeutic drug level monitoring: Secondary | ICD-10-CM | POA: Diagnosis not present

## 2024-04-16 DIAGNOSIS — C50412 Malignant neoplasm of upper-outer quadrant of left female breast: Secondary | ICD-10-CM | POA: Diagnosis not present

## 2024-04-16 DIAGNOSIS — Z853 Personal history of malignant neoplasm of breast: Secondary | ICD-10-CM | POA: Diagnosis not present

## 2024-04-16 DIAGNOSIS — Z79811 Long term (current) use of aromatase inhibitors: Secondary | ICD-10-CM | POA: Diagnosis not present

## 2024-04-16 DIAGNOSIS — C50812 Malignant neoplasm of overlapping sites of left female breast: Secondary | ICD-10-CM | POA: Diagnosis not present

## 2024-04-16 DIAGNOSIS — Z170 Estrogen receptor positive status [ER+]: Secondary | ICD-10-CM | POA: Diagnosis not present

## 2024-04-16 DIAGNOSIS — Z9012 Acquired absence of left breast and nipple: Secondary | ICD-10-CM | POA: Diagnosis not present

## 2024-04-16 DIAGNOSIS — Z79620 Long term (current) use of immunosuppressive biologic: Secondary | ICD-10-CM | POA: Diagnosis not present

## 2024-04-16 DIAGNOSIS — M818 Other osteoporosis without current pathological fracture: Secondary | ICD-10-CM | POA: Diagnosis not present

## 2024-05-24 DIAGNOSIS — E538 Deficiency of other specified B group vitamins: Secondary | ICD-10-CM | POA: Diagnosis not present

## 2024-05-24 DIAGNOSIS — R7301 Impaired fasting glucose: Secondary | ICD-10-CM | POA: Diagnosis not present

## 2024-05-24 DIAGNOSIS — E785 Hyperlipidemia, unspecified: Secondary | ICD-10-CM | POA: Diagnosis not present

## 2024-05-28 ENCOUNTER — Other Ambulatory Visit: Payer: Self-pay | Admitting: Physician Assistant

## 2024-05-28 MED ORDER — LISDEXAMFETAMINE DIMESYLATE 20 MG PO CAPS: 20.0000 mg | ORAL_CAPSULE | Freq: Every day | ORAL | 0 refills | Status: DC

## 2024-05-31 DIAGNOSIS — Z1339 Encounter for screening examination for other mental health and behavioral disorders: Secondary | ICD-10-CM | POA: Diagnosis not present

## 2024-05-31 DIAGNOSIS — C50912 Malignant neoplasm of unspecified site of left female breast: Secondary | ICD-10-CM | POA: Diagnosis not present

## 2024-05-31 DIAGNOSIS — Z0000 Encounter for general adult medical examination without abnormal findings: Secondary | ICD-10-CM | POA: Diagnosis not present

## 2024-05-31 DIAGNOSIS — Z1331 Encounter for screening for depression: Secondary | ICD-10-CM | POA: Diagnosis not present

## 2024-05-31 DIAGNOSIS — Z23 Encounter for immunization: Secondary | ICD-10-CM | POA: Diagnosis not present

## 2024-05-31 DIAGNOSIS — R82998 Other abnormal findings in urine: Secondary | ICD-10-CM | POA: Diagnosis not present

## 2024-06-05 ENCOUNTER — Ambulatory Visit: Admitting: Physician Assistant

## 2024-06-05 ENCOUNTER — Encounter: Payer: Self-pay | Admitting: Physician Assistant

## 2024-06-05 DIAGNOSIS — F900 Attention-deficit hyperactivity disorder, predominantly inattentive type: Secondary | ICD-10-CM

## 2024-06-05 DIAGNOSIS — F40243 Fear of flying: Secondary | ICD-10-CM | POA: Diagnosis not present

## 2024-06-05 MED ORDER — LISDEXAMFETAMINE DIMESYLATE 20 MG PO CAPS: 20.0000 mg | ORAL_CAPSULE | Freq: Every day | ORAL | 0 refills | Status: AC

## 2024-06-05 NOTE — Progress Notes (Signed)
 [facility]  Patient ID: Tonya Whitehead,  MRN: [id number]  PCP: Loreli Elsie JONETTA Mickey., MD  Date of Evaluation: 06/05/2024 Time spent:20 minutes  Chief Complaint:  Chief Complaint   ADHD; Anxiety; Follow-up    HISTORY/CURRENT STATUS: HPI  for routine med check.  Tonya Whitehead is doing really well. The current dose of Vyvanse  is effective. Rarely takes the Adderall.  States that attention is good without easy distractibility.  Able to focus on things and finish tasks to completion.   She is able to enjoy things.  Energy and motivation are good.  Work is going well. No reports of extreme sadness, tearfulness, or feelings of hopelessness.  Sleeps well most of the time. ADLs and personal hygiene are normal.  No change in memory.  Appetite has not changed.  Weight is stable.  No c/o anxiety.  No mania, delirium, AH/VH.  No SI/HI.  Individual Medical History/ Review of Systems: Changes? :No   Past medications for mental health diagnoses include: SSRI post partum for anxiety.    No mental health hospitalizations.   Allergies: Gluten meal, Other, Skin adhesives [cyanoacrylate], Sulfa antibiotics, and Latex  Current Medications:  Current Outpatient Medications:    amphetamine -dextroamphetamine  (ADDERALL) 10 MG tablet, Take 10 mg by mouth daily as needed. (Patient taking differently: Take 10 mg by mouth daily as needed. Rarely taking), Disp: , Rfl:    Calcium Carbonate (CALCIUM 500 PO), Take by mouth., Disp: , Rfl:    Cyanocobalamin  (B-12 PO), Take by mouth., Disp: , Rfl:    denosumab  (PROLIA ) 60 MG/ML SOSY injection, Inject 60 mg into the skin every 6 (six) months., Disp: , Rfl:    letrozole  (FEMARA ) 2.5 MG tablet, TAKE ONE TABLET BY MOUTH DAILY, Disp: 90 tablet, Rfl: 2   levothyroxine  (SYNTHROID ) 100 MCG tablet, Take 100 mcg by mouth daily., Disp: , Rfl:    [START ON 07/24/2024] lisdexamfetamine (VYVANSE ) 20 MG capsule, Take 1 capsule (20 mg total) by mouth daily., Disp: 30  capsule, Rfl: 0   [START ON 06/26/2024] lisdexamfetamine (VYVANSE ) 20 MG capsule, Take 1 capsule (20 mg total) by mouth daily., Disp: 30 capsule, Rfl: 0   Magnesium 400 MG CAPS, Take by mouth., Disp: , Rfl:    Vitamin D, Cholecalciferol, 25 MCG (1000 UT) TABS, Take by mouth., Disp: , Rfl:    Vitamin D, Ergocalciferol, (DRISDOL) 1.25 MG (50000 UNIT) CAPS capsule, Take 50,000 Units by mouth every 7 (seven) days., Disp: , Rfl:    [START ON 08/23/2024] lisdexamfetamine (VYVANSE ) 20 MG capsule, Take 1 capsule (20 mg total) by mouth daily., Disp: 30 capsule, Rfl: 0 Medication Side Effects: none  Family Medical/ Social History: Changes? No  MENTAL HEALTH EXAM:  There were no vitals taken for this visit.There is no height or weight on file to calculate BMI.  General Appearance: Casual and Well Groomed  Eye Contact:  Good  Speech:  Clear and Coherent and Normal Rate  Volume:  Normal  Mood:  Euthymic  Affect:  Congruent  Thought Process:  Goal Directed and Descriptions of Associations: Circumstantial  Orientation:  Full (Time, Place, and Person)  Thought Content: Logical   Suicidal Thoughts:  No  Homicidal Thoughts:  No  Memory:  WNL  Judgement:  Good  Insight:  Good  Psychomotor Activity:  Normal  Concentration:  Concentration: Good and Attention Span: Good  Recall:  Good  Fund of Knowledge: Good  Language: Good  Assets:  Communication Skills Desire for Improvement Financial Resources/Insurance Housing Physical Health Resilience  Social [employer]  ADL's:  Intact  Cognition: WNL  Prognosis:  Good   DIAGNOSES:    ICD-10-CM   1. Attention deficit hyperactivity disorder (ADHD), predominantly inattentive type  F90.0     2. 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  Vyvanse  filled 05/28/2024. I provided approximately  20 minutes of face to face time during this encounter, including time spent  before and after the visit in records review, medical decision making, counseling pertinent to today's visit, and charting.   Tonya Whitehead is doing well on the current treatment so no changes are needed.   Continue Xanax  0.5 mg, 1/2-1 every day prn flying.  Continue Adderall 10 mg, 1/2-1 in afternoon and early evening prn (she never takes it) Continue Vyvanse  20 mg, 1 q am. Return in 6 months.  Verneita Cooks, PA-C

## 2024-06-11 ENCOUNTER — Encounter: Payer: Self-pay | Admitting: Physician Assistant
